# Patient Record
Sex: Female | Born: 1945 | Race: White | Hispanic: No | Smoking: Former smoker
Health system: Southern US, Community
[De-identification: ages and names within clinical notes are randomized; demographics above are authoritative.]

## PROBLEM LIST (undated history)

## (undated) VITALS — BP 156/92 | HR 80 | Temp 98.1°F | Resp 16 | Ht 64.0 in | Wt 157.0 lb

## (undated) DIAGNOSIS — I10 Essential (primary) hypertension: Secondary | ICD-10-CM

## (undated) DIAGNOSIS — F32A Depression, unspecified: Secondary | ICD-10-CM

## (undated) DIAGNOSIS — F329 Major depressive disorder, single episode, unspecified: Secondary | ICD-10-CM

## (undated) DIAGNOSIS — H409 Unspecified glaucoma: Secondary | ICD-10-CM

## (undated) DIAGNOSIS — M1990 Unspecified osteoarthritis, unspecified site: Secondary | ICD-10-CM

## (undated) DIAGNOSIS — E785 Hyperlipidemia, unspecified: Secondary | ICD-10-CM

## (undated) DIAGNOSIS — F419 Anxiety disorder, unspecified: Secondary | ICD-10-CM

## (undated) DIAGNOSIS — T7840XA Allergy, unspecified, initial encounter: Secondary | ICD-10-CM

## (undated) DIAGNOSIS — S42209A Unspecified fracture of upper end of unspecified humerus, initial encounter for closed fracture: Secondary | ICD-10-CM

## (undated) HISTORY — DX: Allergy, unspecified, initial encounter: T78.40XA

## (undated) HISTORY — DX: Essential (primary) hypertension: I10

## (undated) HISTORY — PX: ABDOMINAL HYSTERECTOMY: SHX81

## (undated) HISTORY — PX: HIP FRACTURE SURGERY: SHX118

---

## 2012-07-03 ENCOUNTER — Emergency Department (INDEPENDENT_AMBULATORY_CARE_PROVIDER_SITE_OTHER)
Admission: EM | Admit: 2012-07-03 | Discharge: 2012-07-03 | Disposition: A | Discharge status: Home / Self Care | Payer: Medicare Other | Source: Home / Self Care | Attending: Family Medicine | Admitting: Family Medicine

## 2012-07-03 DIAGNOSIS — I10 Essential (primary) hypertension: Secondary | ICD-10-CM

## 2012-07-03 DIAGNOSIS — R1013 Epigastric pain: Secondary | ICD-10-CM

## 2012-07-03 DIAGNOSIS — R109 Unspecified abdominal pain: Secondary | ICD-10-CM

## 2012-07-03 HISTORY — DX: Unspecified glaucoma: H40.9

## 2012-07-03 LAB — POCT URINALYSIS DIPSTICK
Protein, UA: 100
Spec Grav, UA: 1.015 (ref 1.005–1.03)
Urobilinogen, UA: 0.2 (ref 0–1)

## 2012-07-03 LAB — POCT CBC W AUTO DIFF (K'VILLE URGENT CARE)

## 2012-07-03 MED ORDER — METRONIDAZOLE 500 MG PO TABS: ORAL_TABLET | ORAL | Status: DC

## 2012-07-03 MED ORDER — HYDROCHLOROTHIAZIDE 12.5 MG PO TABS: 12.5000 mg | ORAL_TABLET | ORAL | Status: DC

## 2012-07-03 MED ORDER — SULFAMETHOXAZOLE-TRIMETHOPRIM 800-160 MG PO TABS: 1.0000 | ORAL_TABLET | Freq: Two times a day (BID) | ORAL | Status: DC

## 2012-07-03 NOTE — ED Notes (Signed)
Pt states she have abdominal pain that started on Friday after eating and continues. Denies SHOB, nausea, CP

## 2012-07-03 NOTE — ED Provider Notes (Signed)
History     CSN: [id number]  Arrival date & time 07/03/12  1629   First MD Initiated Contact with Patient 07/03/12 1653      Chief Complaint  Patient presents with  . Abdominal Pain      HPI Comments: Patient states that she developed vague lower abdominal pain about 4 days ago, non-radiating.  The pain has been generally constant for the past 48 hours, and awakened her last night.  She noted two brief episodes of reflux with mild nausea today, resolved.  No GU symptoms.  Her Bowel movements have been normal.  No fevers, chills, and sweats.  No vomiting.  She states that she has been eating lots of nuts about a month ago. She has a past surgical history of hysterectomy, but both ovaries remain.  Patient is a 66 y.o. female presenting with abdominal pain. The history is provided by the patient.  Abdominal Pain The primary symptoms of the illness include abdominal pain and nausea. The primary symptoms of the illness do not include fever, fatigue, shortness of breath, vomiting, diarrhea, hematemesis, hematochezia or dysuria. Episode onset: 4 days ago. The onset of the illness was gradual.  Risk factors for an acute abdominal problem include being elderly. Additional symptoms associated with the illness include heartburn. Symptoms associated with the illness do not include chills, anorexia, diaphoresis, constipation, urgency, hematuria, frequency or back pain.    Past Medical History  Diagnosis Date  . Glaucoma     Past Surgical History  Procedure Date  . Abdominal hysterectomy     Family History  Problem Relation Age of Onset  . Cancer Neg Hx   . Hypertension Neg Hx     History  Substance Use Topics  . Smoking status: Former Games developer  . Smokeless tobacco: Not on file  . Alcohol Use: No    OB History    Grav Para Term Preterm Abortions TAB SAB Ect Mult Living                  Review of Systems  Constitutional: Negative for fever, chills, diaphoresis and fatigue.    Respiratory: Negative for shortness of breath.   Gastrointestinal: Positive for heartburn, nausea and abdominal pain. Negative for vomiting, diarrhea, constipation, hematochezia, anorexia and hematemesis.  Genitourinary: Negative for dysuria, urgency, frequency and hematuria.  Musculoskeletal: Negative for back pain.  All other systems reviewed and are negative.    Allergies  Penicillins  Home Medications   Current Outpatient Rx  Name Route Sig Dispense Refill  . METRONIDAZOLE 500 MG PO TABS  Take one tab by mouth every 6 hours 28 tablet 0  . SULFAMETHOXAZOLE-TRIMETHOPRIM 800-160 MG PO TABS Oral Take 1 tablet by mouth 2 (two) times daily. 14 tablet 0  . TRAVOPROST (BAK FREE) 0.004 % OP SOLN  1 drop at bedtime.    Marland Kitchen LISINOPRIL-HYDROCHLOROTHIAZIDE 20-25 MG PO TABS Oral Take 1 tablet by mouth daily. 90 tablet 3    BP 224/119  Pulse 101  Temp 97.2 F (36.2 C)  Resp 24  Ht 5\' 4"  (1.626 m)  Wt 169 lb (76.658 kg)  BMI 29.01 kg/m2  SpO2 98%  Physical Exam Nursing notes and Vital Signs reviewed. Appearance:  Patient appears stated age, and in no acute distress.  She is alert and oriented  Eyes:  Pupils are equal, round, and reactive to light and accomodation.  Extraocular movement is intact.  Conjunctivae are not inflamed  Ears:  Canals normal.  Tympanic membranes normal.  Nose:  Mildly congested turbinates.  No sinus tenderness.   Pharynx:  Normal Neck:  Supple.  No adenopathy or thyromegaly.  Carotids normal upstroke Lungs:  Clear to auscultation.  Breath sounds are equal.  Heart:  Regular rate and rhythm without murmurs, rubs, or gallops.  Abdomen:   Tenderness in the right lower quadrant, and right peri-umbilical area.  No rebound tenderness.  No masses or hepatosplenomegaly.  Bowel sounds are present.  No CVA or flank tenderness.  Negative iliopsoas and obdurator tests. Extremities:  No edema.  No calf tenderness Skin:  No rash present.   ED Course  Procedures   none   Labs Reviewed  POCT CBC W AUTO DIFF ([facility])  WBC 8.6; LY 30.4; MO 2.3; GR 67.3; Hgb 14.9; Platelets 309   POCT URINALYSIS DIPSTICK:  Ket 15mg /dL; Blood small; Pro 100mg /dL; leuks trace   EKG:  No acute changes   1. Abdominal  pain, other specified site; suspect early diverticulitis.  Normal white blood count is reassuring.  2. Epigastric abdominal pain   3. Essential hypertension, benign; uncontrolled       MDM  Begin HCTZ 12.5mg  once daily. Begin Septra DS and Flagyl. Begin clear liquids for 18 to 24 hours.  Gradually advance to a BRAT diet for one to two days.  Resume regular diet as tolerated.  Check blood pressure daily at home and record. Followup with PCP in two days, earlier for worsening symptoms.        Lattie Haw, MD 07/05/12 [phone]

## 2012-07-05 ENCOUNTER — Encounter: Payer: Self-pay | Admitting: Sports Medicine

## 2012-07-05 ENCOUNTER — Ambulatory Visit (INDEPENDENT_AMBULATORY_CARE_PROVIDER_SITE_OTHER): Payer: Medicare Other | Admitting: Sports Medicine

## 2012-07-05 VITALS — BP 235/107 | HR 85 | Ht 64.0 in | Wt 169.0 lb

## 2012-07-05 DIAGNOSIS — L909 Atrophic disorder of skin, unspecified: Secondary | ICD-10-CM

## 2012-07-05 DIAGNOSIS — L919 Hypertrophic disorder of the skin, unspecified: Secondary | ICD-10-CM

## 2012-07-05 DIAGNOSIS — E785 Hyperlipidemia, unspecified: Secondary | ICD-10-CM

## 2012-07-05 DIAGNOSIS — I10 Essential (primary) hypertension: Secondary | ICD-10-CM | POA: Insufficient documentation

## 2012-07-05 DIAGNOSIS — L918 Other hypertrophic disorders of the skin: Secondary | ICD-10-CM | POA: Insufficient documentation

## 2012-07-05 LAB — COMPREHENSIVE METABOLIC PANEL
Alkaline Phosphatase: 94 U/L (ref 39–117)
BUN: 13 mg/dL (ref 6–23)
Creat: 0.79 mg/dL (ref 0.50–1.10)
Glucose, Bld: 120 mg/dL — ABNORMAL HIGH (ref 70–99)
Total Bilirubin: 0.6 mg/dL (ref 0.3–1.2)

## 2012-07-05 MED ORDER — LISINOPRIL-HYDROCHLOROTHIAZIDE 20-25 MG PO TABS: 1.0000 | ORAL_TABLET | Freq: Every day | ORAL | Status: DC

## 2012-07-05 NOTE — Assessment & Plan Note (Signed)
Extremely elevated today, but asymptomatic and without any signs of organ damage. We will go ahead and check a comprehensive metabolic panel, CBC, TSH, A1c and urinalysis. I'm going to change her medication to lisinopril/hydrochlorothiazide combo. I'm going to see her back in 2 weeks.

## 2012-07-05 NOTE — Progress Notes (Signed)
Subjective:    CC: Establish care.   HPI:  Hypertension: Very elevated, mild headache, but no visual changes, chest pain, shortness of breath, or lower extremity swelling. She was started on hydrochlorothiazide 12.5 mg at the urgent care, blood pressure still unchanged.  Abdominal pain: Diagnosed with diverticulitis at urgent care, overall greatly improved with Flagyl and Cipro. She also notes that she did have a high residue diet before the pain started.  Skin tag on face: Would like this removed.  Past medical history, Surgical history, Family history, Social history, Allergies, and medications have been entered into the medical record, reviewed, and no changes needed.   Review of Systems: No headache, visual changes, nausea, vomiting, diarrhea, constipation, dizziness, abdominal pain, skin rash, fevers, chills, night sweats, swollen lymph nodes, weight loss, chest pain, body aches, joint swelling, muscle aches, or shortness of breath.   Objective:    General: Well Developed, well nourished, and in no acute distress.  Neuro: Alert and oriented x3, extra-ocular muscles intact.  HEENT: Normocephalic, atraumatic, pupils equal round reactive to light, neck supple, no masses, no lymphadenopathy, thyroid nonpalpable.  Skin: Warm and dry, no rashes noted. There is a lesion on her right cheek that appears to be a skin tag. Cardiac: Regular rate and rhythm, no murmurs rubs or gallops. No carotid bruit, no renal bruit, no palpable abdominal pulsatile mass. Respiratory: Clear to auscultation bilaterally. Not using accessory muscles, speaking in full sentences.  Abdominal: Soft, nontender, nondistended, positive bowel sounds, no masses, no organomegaly.  Musculoskeletal: Shoulder, elbow, wrist, hip, knee, ankle stable, and with full range of motion.  Impression and Recommendations:

## 2012-07-05 NOTE — Assessment & Plan Note (Signed)
I am happy to remove this in the office, however we agreed that she would control her blood pressure before removing the skin tag.

## 2012-07-06 ENCOUNTER — Encounter: Payer: Self-pay | Admitting: Sports Medicine

## 2012-07-06 ENCOUNTER — Telehealth: Payer: Self-pay | Admitting: *Deleted

## 2012-07-06 ENCOUNTER — Ambulatory Visit (INDEPENDENT_AMBULATORY_CARE_PROVIDER_SITE_OTHER): Payer: Medicare Other | Admitting: Sports Medicine

## 2012-07-06 VITALS — BP 182/104 | HR 75

## 2012-07-06 DIAGNOSIS — R21 Rash and other nonspecific skin eruption: Secondary | ICD-10-CM

## 2012-07-06 LAB — CBC
HCT: 40.5 % (ref 36.0–46.0)
Hemoglobin: 14.4 g/dL (ref 12.0–15.0)
MCH: 30.6 pg (ref 26.0–34.0)
MCHC: 35.6 g/dL (ref 30.0–36.0)
MCV: 86.2 fL (ref 78.0–100.0)
Platelets: 351 K/uL (ref 150–400)
RBC: 4.7 MIL/uL (ref 3.87–5.11)
RDW: 12.2 % (ref 11.5–15.5)
WBC: 5.5 K/uL (ref 4.0–10.5)

## 2012-07-06 LAB — LIPID PANEL
Cholesterol: 239 mg/dL — ABNORMAL HIGH (ref 0–200)
HDL: 64 mg/dL (ref >39)
LDL Cholesterol: 163 mg/dL — ABNORMAL HIGH (ref 0–99)
Total CHOL/HDL Ratio: 3.7 Ratio
Triglycerides: 59 mg/dL (ref <150)
VLDL: 12 mg/dL (ref 0–40)

## 2012-07-06 LAB — COMPREHENSIVE METABOLIC PANEL WITH GFR
ALT: 21 U/L (ref 0–35)
AST: 24 U/L (ref 0–37)
Albumin: 4.7 g/dL (ref 3.5–5.2)
Alkaline Phosphatase: 79 U/L (ref 39–117)
BUN: 11 mg/dL (ref 6–23)
Chloride: 97 meq/L (ref 96–112)
Potassium: 4.1 meq/L (ref 3.5–5.3)
Sodium: 134 meq/L — ABNORMAL LOW (ref 135–145)

## 2012-07-06 LAB — COMPREHENSIVE METABOLIC PANEL
CO2: 26 mEq/L (ref 19–32)
Calcium: 9.6 mg/dL (ref 8.4–10.5)
Creat: 1.09 mg/dL (ref 0.50–1.10)
Glucose, Bld: 106 mg/dL — ABNORMAL HIGH (ref 70–99)
Total Bilirubin: 0.7 mg/dL (ref 0.3–1.2)
Total Protein: 6.9 g/dL (ref 6.0–8.3)

## 2012-07-06 LAB — HEMOGLOBIN A1C
Hgb A1c MFr Bld: 6.1 % — ABNORMAL HIGH (ref <5.7)
Mean Plasma Glucose: 128 mg/dL — ABNORMAL HIGH (ref <117)

## 2012-07-06 LAB — TSH: TSH: 2.111 u[IU]/mL (ref 0.350–4.500)

## 2012-07-06 MED ORDER — METHYLPREDNISOLONE SODIUM SUCC 125 MG IJ SOLR
125.0000 mg | Freq: Once | INTRAMUSCULAR | Status: AC
  Administered 2012-07-06: 125 mg via INTRAMUSCULAR

## 2012-07-06 MED ORDER — SODIUM CHLORIDE 0.9 % IV SOLN: 125.0000 mg | Freq: Once | INTRAVENOUS | Status: DC

## 2012-07-06 NOTE — Assessment & Plan Note (Addendum)
I doubt that this is related to lisinopril/hydrochlorothiazide combination pill. Is not pruritic. She does feel a little bit flushed. This does not represent angioedema. We will give her a shot of Solu-Medrol 125 mg, and she will use Benadryl 4 times a day. She will continue her lisinopril and hydrochlorothiazide, and come back to see me at her regularly scheduled blood pressure check.

## 2012-07-06 NOTE — Telephone Encounter (Signed)
Pt calls office back and states she seen you this morning and you were gonna call in an antihistamine. She checked with [facility] in [place] and they do not have anything

## 2012-07-06 NOTE — Telephone Encounter (Signed)
Kristy Hays,   On this pt I gave solu-medrol 125 IM. The order in the computer that is for solumedrol 130 IV is not correct. It wont let me delete the order. Is there any way you can delete this so that she doesn't get charged twice and it doesn't look like we gave this to her?

## 2012-07-06 NOTE — Progress Notes (Signed)
Subjective:    CC: Rash  HPI: Kristy Hays saw me yesterday for elevated blood pressure, she was already taking hydrochlorothiazide, as well as a few days of Cipro and Flagyl for diverticulitis. I should change her medicines to lisinopril hydrochlorothiazide combination pill. She took that yesterday, this morning she noted a slightly erythematous rash on her chest. It was not pruritic, she denies any numbness or tingling in the face, swelling of the lips or tongue, or symptoms anywhere else on her body. She denies using any other medicines, drinking alcohol, insect bites, new detergents, or new lotions, or perfumes. She denies any shortness of breath.  Past medical history, Surgical history, Family history, Social history, Allergies, and medications have been entered into the medical record, reviewed, and no changes needed.   Review of Systems: No fevers, chills, night sweats, weight loss, chest pain, or shortness of breath.   Objective:    General: Well Developed, well nourished, and in no acute distress.  Neuro: Alert and oriented x3, extra-ocular muscles intact.  HEENT: Normocephalic, atraumatic, pupils equal round reactive to light, neck supple, no masses, no lymphadenopathy, thyroid nonpalpable.  Skin: Warm and dry, there is a very minimal amount of erythema is blanching, and not excoriated on her upper chest. There's no lip, tongue swelling, or mucocutaneous involvement. Cardiac: Regular rate and rhythm, no murmurs rubs or gallops.  Respiratory: Clear to auscultation bilaterally. Not using accessory muscles, speaking in full sentences.  125 mg of intramuscular Solu-Medrol given in the office.   Impression and Recommendations:

## 2012-07-06 NOTE — Telephone Encounter (Signed)
Yeah I wanted her to take benadryl 25mg  q6h.  Its OTC but I can call it in if she wants?

## 2012-07-06 NOTE — Telephone Encounter (Signed)
Pt notified and she purchase this OTC

## 2012-07-07 ENCOUNTER — Telehealth: Payer: Self-pay | Admitting: *Deleted

## 2012-07-07 ENCOUNTER — Ambulatory Visit (INDEPENDENT_AMBULATORY_CARE_PROVIDER_SITE_OTHER): Payer: Medicare Other | Admitting: Sports Medicine

## 2012-07-07 ENCOUNTER — Encounter: Payer: Self-pay | Admitting: Sports Medicine

## 2012-07-07 VITALS — BP 173/96 | HR 91 | Wt 167.0 lb

## 2012-07-07 DIAGNOSIS — R21 Rash and other nonspecific skin eruption: Secondary | ICD-10-CM

## 2012-07-07 DIAGNOSIS — E785 Hyperlipidemia, unspecified: Secondary | ICD-10-CM | POA: Insufficient documentation

## 2012-07-07 DIAGNOSIS — F4323 Adjustment disorder with mixed anxiety and depressed mood: Secondary | ICD-10-CM | POA: Insufficient documentation

## 2012-07-07 DIAGNOSIS — I10 Essential (primary) hypertension: Secondary | ICD-10-CM

## 2012-07-07 DIAGNOSIS — Z711 Person with feared health complaint in whom no diagnosis is made: Secondary | ICD-10-CM

## 2012-07-07 LAB — URINALYSIS, ROUTINE W REFLEX MICROSCOPIC
Glucose, UA: NEGATIVE mg/dL
Hgb urine dipstick: NEGATIVE
Nitrite: POSITIVE — AB
Protein, ur: NEGATIVE mg/dL
Specific Gravity, Urine: 1.028 (ref 1.005–1.030)
Urobilinogen, UA: 1 mg/dL (ref 0.0–1.0)
pH: 5.5 (ref 5.0–8.0)

## 2012-07-07 LAB — URINALYSIS, MICROSCOPIC ONLY
Bacteria, UA: NONE SEEN
Casts: NONE SEEN

## 2012-07-07 MED ORDER — DIAZEPAM 5 MG PO TABS: 5.0000 mg | ORAL_TABLET | Freq: Two times a day (BID) | ORAL | Status: DC | PRN

## 2012-07-07 MED ORDER — ATORVASTATIN CALCIUM 40 MG PO TABS: 40.0000 mg | ORAL_TABLET | Freq: Every day | ORAL | Status: DC

## 2012-07-07 MED ORDER — ONDANSETRON 8 MG PO TBDP: 8.0000 mg | ORAL_TABLET | Freq: Three times a day (TID) | ORAL | Status: DC | PRN

## 2012-07-07 NOTE — Assessment & Plan Note (Signed)
She is now jittery and a little bit nauseated, I do suspect from the steroid given yesterday. I would like her to stop the metronidazole as this can cause nausea, I will also give her a little bit of Valium, and Zofran. She will come back to see me at her next regularly scheduled visit.

## 2012-07-07 NOTE — Telephone Encounter (Signed)
Pt notified and sent to schedule appt 

## 2012-07-07 NOTE — Assessment & Plan Note (Signed)
Improving. Continue medications. We'll go ahead and check a CMET with her current nausea.

## 2012-07-07 NOTE — Addendum Note (Signed)
Addended by: Monica Becton on: 07/07/2012 10:43 AM   Modules accepted: Orders

## 2012-07-07 NOTE — Assessment & Plan Note (Signed)
Improving status post Solu-Medrol yesterday.

## 2012-07-07 NOTE — Progress Notes (Signed)
Subjective:    CC: Followup  HPI: This is a very pleasant 66 year old female twice yesterday for a rash. To recap, she was diagnosed in urgent care with diverticulitis, and started on Flagyl and Cipro. She saw me the next day, and they started her on lisinopril/hydrochlorothiazide combination medicine for hypertension. Her blood pressure had been improving, however she developed a rash on her chest that was not pruritic. She had no shortness of breath, and no lip or tongue swelling. I gave her a shot of Solu-Medrol, but did not change her medications. Overall the rash is improving, but today she returns feeling somewhat jittery. She also has a small amount of loose stools. She also has a little bit of nausea.  She is otherwise asymptomatic.  She thinks she is just anxious about her treatments and diagnoses thus far.  Past medical history, Surgical history, Family history, Social history, Allergies, and medications have been entered into the medical record, reviewed, and no changes needed.   Review of Systems: No fevers, chills, night sweats, weight loss, chest pain, or shortness of breath.   Objective:    General: Well Developed, well nourished, and in no acute distress.  Neuro: Alert and oriented x3, extra-ocular muscles intact.  HEENT: Normocephalic, atraumatic, pupils equal round reactive to light, neck supple, no masses, no lymphadenopathy, thyroid nonpalpable.  Skin: Warm and dry, the mild erythema that was present on her chest before has improved. Cardiac: Regular rate and rhythm, no murmurs rubs or gallops.  Respiratory: Clear to auscultation bilaterally. Not using accessory muscles, speaking in full sentences. Abdomen: Soft, nontender, nondistended, normal bowel sounds.  Impression and Recommendations:

## 2012-07-07 NOTE — Telephone Encounter (Signed)
I never rx'ed any antibiotics.  I think urgent care did. Have her stop the metronidazole for now. I would also like her to drop by to see me sometime today to see whats going on. Thanks!

## 2012-07-07 NOTE — Telephone Encounter (Signed)
Pt calls and states you put her on BP med and 2 antibiotics and she is very nauseated and has diarrhea. Please advise

## 2012-07-08 ENCOUNTER — Telehealth: Payer: Self-pay | Admitting: Sports Medicine

## 2012-07-08 LAB — COMPREHENSIVE METABOLIC PANEL
ALT: 24 U/L (ref 0–35)
BUN: 16 mg/dL (ref 6–23)
CO2: 24 mEq/L (ref 19–32)
Calcium: 10 mg/dL (ref 8.4–10.5)
Creat: 1.26 mg/dL — ABNORMAL HIGH (ref 0.50–1.10)
Glucose, Bld: 106 mg/dL — ABNORMAL HIGH (ref 70–99)
Total Bilirubin: 0.6 mg/dL (ref 0.3–1.2)

## 2012-07-08 LAB — COMPREHENSIVE METABOLIC PANEL WITH GFR
AST: 28 U/L (ref 0–37)
Albumin: 4.8 g/dL (ref 3.5–5.2)
Alkaline Phosphatase: 80 U/L (ref 39–117)
Chloride: 93 meq/L — ABNORMAL LOW (ref 96–112)
Potassium: 3.8 meq/L (ref 3.5–5.3)
Sodium: 130 meq/L — ABNORMAL LOW (ref 135–145)
Total Protein: 7.5 g/dL (ref 6.0–8.3)

## 2012-07-08 MED ORDER — AMLODIPINE BESYLATE 5 MG PO TABS: 5.0000 mg | ORAL_TABLET | Freq: Every day | ORAL | Status: DC

## 2012-07-08 NOTE — Addendum Note (Signed)
Addended by: Monica Becton on: 07/08/2012 11:05 AM   Modules accepted: Orders

## 2012-07-08 NOTE — Telephone Encounter (Signed)
Called patient, advised of lab results. She will hold BP meds for a week and liberalize salt in diet during that time. Will restart at 1/2 tab, and then come back to see me at regularly scheduled visit. 1 whole lis/hctz tab was likely too much, will gradually up-taper.

## 2012-07-11 ENCOUNTER — Encounter: Payer: Self-pay | Admitting: Sports Medicine

## 2012-07-11 ENCOUNTER — Ambulatory Visit (INDEPENDENT_AMBULATORY_CARE_PROVIDER_SITE_OTHER): Payer: Medicare Other | Admitting: Sports Medicine

## 2012-07-11 VITALS — BP 187/103 | HR 88 | Wt 163.0 lb

## 2012-07-11 DIAGNOSIS — L918 Other hypertrophic disorders of the skin: Secondary | ICD-10-CM

## 2012-07-11 DIAGNOSIS — I10 Essential (primary) hypertension: Secondary | ICD-10-CM

## 2012-07-11 DIAGNOSIS — L909 Atrophic disorder of skin, unspecified: Secondary | ICD-10-CM

## 2012-07-11 DIAGNOSIS — E785 Hyperlipidemia, unspecified: Secondary | ICD-10-CM

## 2012-07-11 DIAGNOSIS — Z711 Person with feared health complaint in whom no diagnosis is made: Secondary | ICD-10-CM

## 2012-07-11 MED ORDER — CITALOPRAM HYDROBROMIDE 10 MG PO TABS: 10.0000 mg | ORAL_TABLET | Freq: Every day | ORAL | Status: DC

## 2012-07-11 MED ORDER — DIAZEPAM 5 MG PO TABS: 5.0000 mg | ORAL_TABLET | Freq: Three times a day (TID) | ORAL | Status: DC | PRN

## 2012-07-11 MED ORDER — AMLODIPINE BESYLATE 10 MG PO TABS: 10.0000 mg | ORAL_TABLET | Freq: Every day | ORAL | Status: DC

## 2012-07-11 NOTE — Progress Notes (Signed)
Subjective:    CC: Followup  HPI: Anxiety: Jesika called me this weekend, she was doing better with the occasional Valium, still had significant anxiety, and worry regarding her blood pressure. I asked her to come to the office for further evaluation.  Hypertension: She has started her amlodipine, and remains off of the lisinopril/hydrochlorothiazide combination.  Past medical history, Surgical history, Family history, Social history, Allergies, and medications have been entered into the medical record, reviewed, and no changes needed.   Review of Systems: No fevers, chills, night sweats, weight loss, chest pain, or shortness of breath.   Objective:    General: Well Developed, well nourished, and in no acute distress.  Neuro: Alert and oriented x3, extra-ocular muscles intact.  HEENT: Normocephalic, atraumatic, pupils equal round reactive to light, neck supple, no masses, no lymphadenopathy, thyroid nonpalpable.  Skin: Warm and dry, no rashes. Cardiac: Regular rate and rhythm, no murmurs rubs or gallops.  Respiratory: Clear to auscultation bilaterally. Not using accessory muscles, speaking in full sentences.   Impression and Recommendations:

## 2012-07-11 NOTE — Assessment & Plan Note (Signed)
We'll refill her Valium. I would also like to start her on citalopram, 10 mg daily. We do need to keep aware of possible hyponatremia that can be associated with citalopram. I will likely check her sodium level again in a few weeks.

## 2012-07-11 NOTE — Assessment & Plan Note (Signed)
Continue Lipitor. Recheck in 3 months.

## 2012-07-11 NOTE — Assessment & Plan Note (Signed)
She did have some mild hyponatremia, and increased creatinine on the lisinopril hydrochlorothiazide full tab. She has been on Norvasc for several days now. Restarting lisinopril/hydrochlorothiazide one half tab daily. Increasing Norvasc to 10 mg daily. He will come back in 2 weeks for a blood pressure check.

## 2012-07-19 ENCOUNTER — Ambulatory Visit: Payer: Medicare Other | Admitting: Sports Medicine

## 2012-07-24 ENCOUNTER — Telehealth: Payer: Self-pay | Admitting: Sports Medicine

## 2012-07-24 DIAGNOSIS — E871 Hypo-osmolality and hyponatremia: Secondary | ICD-10-CM

## 2012-07-24 NOTE — Telephone Encounter (Signed)
Kristy Hays called with increasing adverse effects and anxiety that she attributes to her BP medication. Please have her come and get a BMET checked Monday and then come see me Tuesday to discuss changes. Continue all medications in the meantime including the 2 BP meds and celexa.

## 2012-07-25 ENCOUNTER — Telehealth: Payer: Self-pay

## 2012-07-25 ENCOUNTER — Telehealth: Payer: Self-pay | Admitting: *Deleted

## 2012-07-25 DIAGNOSIS — E871 Hypo-osmolality and hyponatremia: Secondary | ICD-10-CM

## 2012-07-25 NOTE — Telephone Encounter (Signed)
Lab released.

## 2012-07-25 NOTE — Telephone Encounter (Signed)
Patient stats the citalopram makes her feel more anxious.

## 2012-07-25 NOTE — Telephone Encounter (Signed)
Pt notified and pt will reschedule her 9 am appt tomorrow for 1130 tomorrow so her daughter can take her

## 2012-07-26 ENCOUNTER — Encounter: Payer: Self-pay | Admitting: Sports Medicine

## 2012-07-26 ENCOUNTER — Ambulatory Visit (INDEPENDENT_AMBULATORY_CARE_PROVIDER_SITE_OTHER): Payer: Medicare Other | Admitting: Sports Medicine

## 2012-07-26 VITALS — BP 195/99 | HR 94 | Wt 160.0 lb

## 2012-07-26 DIAGNOSIS — I10 Essential (primary) hypertension: Secondary | ICD-10-CM

## 2012-07-26 DIAGNOSIS — F411 Generalized anxiety disorder: Secondary | ICD-10-CM

## 2012-07-26 DIAGNOSIS — F419 Anxiety disorder, unspecified: Secondary | ICD-10-CM

## 2012-07-26 LAB — BASIC METABOLIC PANEL WITH GFR
BUN: 13 mg/dL (ref 6–23)
Calcium: 9.5 mg/dL (ref 8.4–10.5)
Creat: 0.78 mg/dL (ref 0.50–1.10)

## 2012-07-26 LAB — BASIC METABOLIC PANEL
CO2: 29 mEq/L (ref 19–32)
Chloride: 106 mEq/L (ref 96–112)
Glucose, Bld: 79 mg/dL (ref 70–99)
Potassium: 3.9 mEq/L (ref 3.5–5.3)
Sodium: 144 mEq/L (ref 135–145)

## 2012-07-26 MED ORDER — BUSPIRONE HCL 5 MG PO TABS: 5.0000 mg | ORAL_TABLET | Freq: Three times a day (TID) | ORAL | Status: DC

## 2012-07-26 MED ORDER — ATENOLOL 25 MG PO TABS: 25.0000 mg | ORAL_TABLET | Freq: Every day | ORAL | Status: DC

## 2012-07-26 NOTE — Assessment & Plan Note (Signed)
Discontinue citalopram. We'll add buspirone.

## 2012-07-26 NOTE — Progress Notes (Signed)
Subjective:    CC: Followup  HPI: This patient comes back for followup of her hypertension. Hypertension: She has been lisinopril/hydrochlorothiazide, and amlodipine. Unfortunately she did have some renal insufficiency initially, we decreased the dose, liberalized her salt, and this has since resolved.  Anxiety: Worsening, and uncontrolled with citalopram 20 mg daily for 2 weeks. She understands the leprosy we need to give this medicine for 6 weeks, but still desires to switch to a different one.  Past medical history, Surgical history, Family history, Social history, Allergies, and medications have been entered into the medical record, reviewed, and no changes needed.   Review of Systems: No fevers, chills, night sweats, weight loss, chest pain, or shortness of breath. No headaches, visual changes, numbness, tingling, weakness.  Objective:    General: Well Developed, well nourished, and in no acute distress.  Neuro: Alert and oriented x3, extra-ocular muscles intact.  HEENT: Normocephalic, atraumatic, pupils equal round reactive to light, neck supple, no masses, no lymphadenopathy, thyroid nonpalpable.  Skin: Warm and dry, no rashes. Cardiac: Regular rate and rhythm, no murmurs rubs or gallops.  Respiratory: Clear to auscultation bilaterally. Not using accessory muscles, speaking in full sentences.  Impression and Recommendations:

## 2012-07-26 NOTE — Patient Instructions (Addendum)
Added to medicines, Antenolol for blood pressure and anxiety. Also added BuSpar for anxiety. Stop the citalopram, continue all other medications.

## 2012-07-26 NOTE — Assessment & Plan Note (Addendum)
Basic metabolic panel has normalized. Continue all current medications, I will add atenolol for both its anti-hypertensive action as well as anti-anxiety. Followup in 2-3 weeks to recheck

## 2012-07-29 ENCOUNTER — Telehealth: Payer: Self-pay | Admitting: *Deleted

## 2012-07-29 ENCOUNTER — Other Ambulatory Visit: Payer: Self-pay | Admitting: Sports Medicine

## 2012-07-29 MED ORDER — HYDROXYZINE HCL 25 MG PO TABS: 25.0000 mg | ORAL_TABLET | Freq: Every evening | ORAL | Status: DC | PRN

## 2012-07-29 NOTE — Telephone Encounter (Signed)
We will try something over the counter, she can try hydroxyzine 25 mg at bedtime. This comes under the name Vistaril and Atarax.

## 2012-07-29 NOTE — Telephone Encounter (Signed)
Please continue BuSpar, adverse effects Will resolve with time.

## 2012-07-29 NOTE — Telephone Encounter (Signed)
Pt informed

## 2012-07-29 NOTE — Telephone Encounter (Signed)
Pt has called stating that she is having SE from the Buspar. States she feels depressed,not sleeping and hands are shaky. She feels it is coming from Buspar and wants to know if she can try something different or if you need to send her to a specialist. Please advise.

## 2012-07-29 NOTE — Telephone Encounter (Signed)
Pt states what can she take to help her sleep?

## 2012-07-29 NOTE — Telephone Encounter (Signed)
Medication sent per Dr. Benjamin Stain.

## 2012-08-01 ENCOUNTER — Telehealth: Payer: Self-pay | Admitting: *Deleted

## 2012-08-01 DIAGNOSIS — F419 Anxiety disorder, unspecified: Secondary | ICD-10-CM

## 2012-08-01 NOTE — Telephone Encounter (Signed)
Pt has called and states she was speaking with her children over the weekend and states they all think she needs to be referred to [facility] for her anxiety. Apparently she has called downstairs and they told her she needs a referral. Please advise.

## 2012-08-01 NOTE — Telephone Encounter (Signed)
I think that this is an excellent idea. I will put in a referral.

## 2012-08-05 ENCOUNTER — Encounter (HOSPITAL_COMMUNITY): Payer: Self-pay | Admitting: Psychiatry

## 2012-08-05 ENCOUNTER — Ambulatory Visit (INDEPENDENT_AMBULATORY_CARE_PROVIDER_SITE_OTHER): Payer: No Typology Code available for payment source | Admitting: Psychiatry

## 2012-08-05 VITALS — BP 174/102 | HR 69 | Ht 64.0 in | Wt 160.0 lb

## 2012-08-05 DIAGNOSIS — F419 Anxiety disorder, unspecified: Secondary | ICD-10-CM

## 2012-08-05 DIAGNOSIS — F4322 Adjustment disorder with anxiety: Secondary | ICD-10-CM

## 2012-08-05 MED ORDER — BUSPIRONE HCL 5 MG PO TABS: 5.0000 mg | ORAL_TABLET | Freq: Three times a day (TID) | ORAL | Status: DC
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
## 2012-08-09 ENCOUNTER — Ambulatory Visit (INDEPENDENT_AMBULATORY_CARE_PROVIDER_SITE_OTHER): Payer: Medicare Other | Admitting: Sports Medicine

## 2012-08-09 ENCOUNTER — Encounter: Payer: Self-pay | Admitting: Sports Medicine

## 2012-08-09 VITALS — BP 176/83 | HR 69 | Resp 18 | Wt 156.0 lb

## 2012-08-09 DIAGNOSIS — Z23 Encounter for immunization: Secondary | ICD-10-CM

## 2012-08-09 DIAGNOSIS — F419 Anxiety disorder, unspecified: Secondary | ICD-10-CM

## 2012-08-09 DIAGNOSIS — Z299 Encounter for prophylactic measures, unspecified: Secondary | ICD-10-CM | POA: Insufficient documentation

## 2012-08-09 DIAGNOSIS — F411 Generalized anxiety disorder: Secondary | ICD-10-CM

## 2012-08-09 DIAGNOSIS — Z298 Encounter for other specified prophylactic measures: Secondary | ICD-10-CM

## 2012-08-09 DIAGNOSIS — Z2911 Encounter for prophylactic immunotherapy for respiratory syncytial virus (RSV): Secondary | ICD-10-CM

## 2012-08-09 DIAGNOSIS — I10 Essential (primary) hypertension: Secondary | ICD-10-CM

## 2012-08-09 MED ORDER — CARVEDILOL 12.5 MG PO TABS: 12.5000 mg | ORAL_TABLET | Freq: Two times a day (BID) | ORAL | Status: DC

## 2012-08-09 NOTE — Assessment & Plan Note (Signed)
Zostavax, Pneumovax, Tdap today. We will be visit colonoscopy and mammogram at the start of next year per patient request.

## 2012-08-09 NOTE — Assessment & Plan Note (Signed)
Continue BuSpar 3 times a day. Symptoms have improved.

## 2012-08-09 NOTE — Progress Notes (Signed)
Subjective:    CC: Followup  HPI: Hypertension: Improved.  Anxiety: Went to see psychiatry, is doing well on BuSpar 3 times a day.  Preventative measures: Due for multiple immunizations, colonoscopy, mammogram. She declines colonoscopy and mammogram, would like to postpone this to the new year.  Past medical history, Surgical history, Family history, Social history, Allergies, and medications have been entered into the medical record, reviewed, and no changes needed.   Review of Systems: No fevers, chills, night sweats, weight loss, chest pain, or shortness of breath.   Objective:    General: Well Developed, well nourished, and in no acute distress.  Neuro: Alert and oriented x3, extra-ocular muscles intact.  HEENT: Normocephalic, atraumatic, pupils equal round reactive to light, neck supple, no masses, no lymphadenopathy, thyroid nonpalpable.  Skin: Warm and dry, no rashes. Cardiac: Regular rate and rhythm, no murmurs rubs or gallops.  Respiratory: Clear to auscultation bilaterally. Not using accessory muscles, speaking in full sentences. No lower extremity edema.  Impression and Recommendations:

## 2012-08-09 NOTE — Assessment & Plan Note (Signed)
Blood pressures improved but not ideal. Discontinue atenolol, and replace with carvedilol 12.5 mg twice a day. Continue lisinopril/hydrochlorothiazide, and amlodipine.

## 2012-08-19 ENCOUNTER — Telehealth (HOSPITAL_COMMUNITY): Payer: Self-pay

## 2012-08-19 NOTE — Telephone Encounter (Signed)
Acknowleged

## 2012-08-23 ENCOUNTER — Ambulatory Visit (INDEPENDENT_AMBULATORY_CARE_PROVIDER_SITE_OTHER): Payer: BLUE CROSS/BLUE SHIELD | Admitting: Sports Medicine

## 2012-08-23 ENCOUNTER — Encounter: Payer: Self-pay | Admitting: Sports Medicine

## 2012-08-23 VITALS — BP 189/97 | HR 69 | Wt 161.0 lb

## 2012-08-23 DIAGNOSIS — L918 Other hypertrophic disorders of the skin: Secondary | ICD-10-CM

## 2012-08-23 DIAGNOSIS — E785 Hyperlipidemia, unspecified: Secondary | ICD-10-CM

## 2012-08-23 DIAGNOSIS — I10 Essential (primary) hypertension: Secondary | ICD-10-CM

## 2012-08-23 DIAGNOSIS — F419 Anxiety disorder, unspecified: Secondary | ICD-10-CM

## 2012-08-23 DIAGNOSIS — L909 Atrophic disorder of skin, unspecified: Secondary | ICD-10-CM

## 2012-08-23 DIAGNOSIS — F411 Generalized anxiety disorder: Secondary | ICD-10-CM

## 2012-08-23 DIAGNOSIS — L919 Hypertrophic disorder of the skin, unspecified: Secondary | ICD-10-CM

## 2012-08-23 MED ORDER — CARVEDILOL 25 MG PO TABS: 25.0000 mg | ORAL_TABLET | Freq: Two times a day (BID) | ORAL | Status: DC

## 2012-08-23 MED ORDER — BUSPIRONE HCL 5 MG PO TABS: 5.0000 mg | ORAL_TABLET | Freq: Three times a day (TID) | ORAL | Status: DC

## 2012-08-23 MED ORDER — ASPIRIN EC 81 MG PO TBEC: 81.0000 mg | DELAYED_RELEASE_TABLET | Freq: Every day | ORAL | Status: DC

## 2012-08-23 NOTE — Assessment & Plan Note (Addendum)
Blood pressure is still relatively uncontrolled. It is improved from before. Increase lisinopril/hydrochlorothiazide to one whole tab daily. Increase carvedilol to 25 mg twice a day. Continue amlodipine 10 mg daily. Come back to see me in 2-3 weeks.

## 2012-08-23 NOTE — Patient Instructions (Addendum)
Increase lisinopril/hydrochlorothiazide to one entire tablet daily. Double up the carvedilol to two 12-1/2 mg tablets twice a day until you ran out, then refill with the new 25 mg tablets and take one tablet twice a day.

## 2012-08-23 NOTE — Progress Notes (Signed)
Subjective:    CC: Followup  HPI: Hypertension: Denies headaches, chest pain, swelling, visual changes. Blood pressure is still elevated, she is currently using one half of a lisinopril/hydrochlorothiazide tablet, amlodipine 10 mg, and carvedilol 12.5 mg twice a day.  Anxiety: Significantly improved, and very well controlled with BuSpar. She is desiring a refill.  Past medical history, Surgical history, Family history, Social history, Allergies, and medications have been entered into the medical record, reviewed, and no changes needed.   Review of Systems: No fevers, chills, night sweats, weight loss, chest pain, or shortness of breath.   Objective:    General: Well Developed, well nourished, and in no acute distress.  Neuro: Alert and oriented x3, extra-ocular muscles intact.  HEENT: Normocephalic, atraumatic, pupils equal round reactive to light, neck supple, no masses, no lymphadenopathy, thyroid nonpalpable.  Skin: Warm and dry, no rashes. Cardiac: Regular rate and rhythm, no murmurs rubs or gallops.  Respiratory: Clear to auscultation bilaterally. Not using accessory muscles, speaking in full sentences.  Impression and Recommendations:

## 2012-08-23 NOTE — Assessment & Plan Note (Signed)
Medicines working extremely well, we will refill this right now.

## 2012-09-01 ENCOUNTER — Ambulatory Visit (INDEPENDENT_AMBULATORY_CARE_PROVIDER_SITE_OTHER): Payer: Medicare Other | Admitting: Psychiatry

## 2012-09-01 ENCOUNTER — Encounter (HOSPITAL_COMMUNITY): Payer: Self-pay | Admitting: Psychiatry

## 2012-09-01 VITALS — BP 158/83 | HR 64 | Ht 64.0 in | Wt 164.0 lb

## 2012-09-01 DIAGNOSIS — F419 Anxiety disorder, unspecified: Secondary | ICD-10-CM

## 2012-09-01 DIAGNOSIS — F4322 Adjustment disorder with anxiety: Secondary | ICD-10-CM
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
## 2012-09-15 ENCOUNTER — Encounter: Payer: Self-pay | Admitting: Sports Medicine

## 2012-09-15 ENCOUNTER — Ambulatory Visit (INDEPENDENT_AMBULATORY_CARE_PROVIDER_SITE_OTHER): Payer: Medicare Other | Admitting: Sports Medicine

## 2012-09-15 VITALS — BP 159/77 | HR 60 | Wt 165.0 lb

## 2012-09-15 DIAGNOSIS — I10 Essential (primary) hypertension: Secondary | ICD-10-CM

## 2012-09-15 MED ORDER — CLONIDINE HCL 0.2 MG PO TABS: 0.2000 mg | ORAL_TABLET | Freq: Two times a day (BID) | ORAL | Status: DC

## 2012-09-15 NOTE — Progress Notes (Signed)
Subjective:    CC: Hypertension followup.  HPI: Hypertension: Greatly improved today. Doing very well with her current medications which include lisinopril/hydrochlorothiazide 20/25 mg, amlodipine at 10 mg, carvedilol 25 mg twice a day. Overall feels much better. Does have a small amount of lower extremity swelling likely from the amlodipine.  Past medical history, Surgical history, Family history, Social history, Allergies, and medications have been entered into the medical record, reviewed, and no changes needed.   Review of Systems: No fevers, chills, night sweats, weight loss, chest pain, or shortness of breath.   Objective:    General: Well Developed, well nourished, and in no acute distress.  Neuro: Alert and oriented x3, extra-ocular muscles intact.  HEENT: Normocephalic, atraumatic, pupils equal round reactive to light, neck supple, no masses, no lymphadenopathy, thyroid nonpalpable.  Skin: Warm and dry, no rashes. Cardiac: Regular rate and rhythm, no murmurs rubs or gallops.  Respiratory: Clear to auscultation bilaterally. Not using accessory muscles, speaking in full sentences.  Impression and Recommendations:

## 2012-09-15 NOTE — Assessment & Plan Note (Signed)
Much improved but not where we need it to be. Adding clonidine 0.2 mg BID. RTC 3 weeks to recheck.

## 2012-10-06 ENCOUNTER — Ambulatory Visit (INDEPENDENT_AMBULATORY_CARE_PROVIDER_SITE_OTHER): Payer: Medicare Other | Admitting: Sports Medicine

## 2012-10-06 ENCOUNTER — Encounter: Payer: Self-pay | Admitting: Sports Medicine

## 2012-10-06 VITALS — BP 155/87 | HR 59 | Wt 169.0 lb

## 2012-10-06 DIAGNOSIS — I10 Essential (primary) hypertension: Secondary | ICD-10-CM

## 2012-10-06 MED ORDER — HYDRALAZINE HCL 25 MG PO TABS: 25.0000 mg | ORAL_TABLET | Freq: Three times a day (TID) | ORAL | Status: DC

## 2012-10-06 NOTE — Progress Notes (Signed)
Subjective:    CC: Followup  HPI: Hypertension: Is significantly improved on carvedilol, lisinopril/hydrochlorothiazide, amlodipine, I recently started clonidine which makes her too groggy however she does continue to take it.  Past medical history, Surgical history, Family history not pertinant except as noted below, Social history, Allergies, and medications have been entered into the medical record, reviewed, and no changes needed.   Review of Systems: No fevers, chills, night sweats, weight loss, chest pain, or shortness of breath.   Objective:    General: Well Developed, well nourished, and in no acute distress.  Neuro: Alert and oriented x3, extra-ocular muscles intact, sensation grossly intact.  HEENT: Normocephalic, atraumatic, pupils equal round reactive to light, neck supple, no masses, no lymphadenopathy, thyroid nonpalpable.  Skin: Warm and dry, no rashes. Cardiac: Regular rate and rhythm, no murmurs rubs or gallops.  Respiratory: Clear to auscultation bilaterally. Not using accessory muscles, speaking in full sentences.  Impression and Recommendations:

## 2012-10-06 NOTE — Assessment & Plan Note (Signed)
Groggy on clonidine, discontinuing that. Adding hydralazine. Return to clinic in 2-3 weeks.

## 2012-10-20 ENCOUNTER — Ambulatory Visit (INDEPENDENT_AMBULATORY_CARE_PROVIDER_SITE_OTHER): Payer: Medicare Other | Admitting: Sports Medicine

## 2012-10-20 ENCOUNTER — Encounter: Payer: Self-pay | Admitting: Sports Medicine

## 2012-10-20 VITALS — BP 173/83 | HR 66 | Wt 166.0 lb

## 2012-10-20 DIAGNOSIS — I10 Essential (primary) hypertension: Secondary | ICD-10-CM

## 2012-10-20 MED ORDER — SPIRONOLACTONE 25 MG PO TABS: 25.0000 mg | ORAL_TABLET | Freq: Two times a day (BID) | ORAL | Status: DC

## 2012-10-20 NOTE — Assessment & Plan Note (Signed)
Intolerance now to hydralazine with continued difficult control hypertension. I'm going to add spironolactone. I would also like to check a sleep study and renal artery Dopplers. We will revisit this in 2 weeks.

## 2012-10-20 NOTE — Progress Notes (Signed)
Subjective:    CC: Followup  HPI: Hypertension: This is been very difficult to control, I currently have her on maximal doses of amlodipine, lisinopril/hydrochlorothiazide, carvedilol, we tried clonidine which she did not tolerate, we have also recently tried hydralazine which she also did not tolerate.  Unfortunately we've been unable to control this. Is however significantly improved since day one of treatment. She has never been evaluated for renal artery stenosis, and has never had a sleep study done.  Anxiety: Stable, no changes.  Hyperlipidemia: Stable, no changes.  Past medical history, Surgical history, Family history not pertinant except as noted below, Social history, Allergies, and medications have been entered into the medical record, reviewed, and no changes needed.   Review of Systems: No fevers, chills, night sweats, weight loss, chest pain, or shortness of breath.   Objective:    General: Well Developed, well nourished, and in no acute distress.  Neuro: Alert and oriented x3, extra-ocular muscles intact, sensation grossly intact.  HEENT: Normocephalic, atraumatic, pupils equal round reactive to light, neck supple, no masses, no lymphadenopathy, thyroid nonpalpable.  Skin: Warm and dry, no rashes. Cardiac: Regular rate and rhythm, no murmurs rubs or gallops.  Respiratory: Clear to auscultation bilaterally. Not using accessory muscles, speaking in full sentences.  Impression and Recommendations:

## 2012-10-24 ENCOUNTER — Telehealth: Payer: Self-pay | Admitting: *Deleted

## 2012-10-24 NOTE — Telephone Encounter (Signed)
Linda with [facility] calls and states that they nor [facility] do renal artery ultrasound so she has forwarded the order to [facility] on [address] for them to schedule. FYI

## 2012-10-31 ENCOUNTER — Telehealth: Payer: Self-pay | Admitting: *Deleted

## 2012-10-31 DIAGNOSIS — I10 Essential (primary) hypertension: Secondary | ICD-10-CM

## 2012-10-31 DIAGNOSIS — I129 Hypertensive chronic kidney disease with stage 1 through stage 4 chronic kidney disease, or unspecified chronic kidney disease: Secondary | ICD-10-CM

## 2012-10-31 NOTE — Telephone Encounter (Signed)
Added renal ultrasound to renal artery dopplers.

## 2012-11-02 ENCOUNTER — Encounter (HOSPITAL_COMMUNITY): Payer: Self-pay | Admitting: Psychiatry

## 2012-11-02 ENCOUNTER — Ambulatory Visit (INDEPENDENT_AMBULATORY_CARE_PROVIDER_SITE_OTHER): Payer: Medicare Other | Admitting: Psychiatry

## 2012-11-02 VITALS — BP 135/76 | HR 60 | Ht 64.0 in | Wt 163.0 lb

## 2012-11-02 DIAGNOSIS — F4322 Adjustment disorder with anxiety: Secondary | ICD-10-CM

## 2012-11-02 MED ORDER — BUSPIRONE HCL 5 MG PO TABS: 5.0000 mg | ORAL_TABLET | Freq: Two times a day (BID) | ORAL | Status: DC
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
## 2012-11-03 ENCOUNTER — Encounter: Payer: Self-pay | Admitting: Sports Medicine

## 2012-11-03 ENCOUNTER — Ambulatory Visit (INDEPENDENT_AMBULATORY_CARE_PROVIDER_SITE_OTHER): Payer: Medicare Other | Admitting: Sports Medicine

## 2012-11-03 VITALS — BP 144/74 | HR 68

## 2012-11-03 DIAGNOSIS — L918 Other hypertrophic disorders of the skin: Secondary | ICD-10-CM

## 2012-11-03 DIAGNOSIS — I10 Essential (primary) hypertension: Secondary | ICD-10-CM

## 2012-11-03 DIAGNOSIS — L909 Atrophic disorder of skin, unspecified: Secondary | ICD-10-CM

## 2012-11-03 MED ORDER — SPIRONOLACTONE 100 MG PO TABS: 50.0000 mg | ORAL_TABLET | Freq: Two times a day (BID) | ORAL | Status: DC

## 2012-11-03 NOTE — Assessment & Plan Note (Signed)
Greatly improved on spironolactone 25 mg twice a day in addition to amlodipine,lisinopril/hydrochlorothiazide, carvedilol. I am increasing spondylectomy 50 mg twice a day. She still needs to follow through with polysomnography. We are also awaiting renal artery Dopplers. Return in 2 weeks.

## 2012-11-03 NOTE — Assessment & Plan Note (Signed)
At this point I would like to get her set up with our facial plastic surgeon. I am going to place a referral.

## 2012-11-03 NOTE — Progress Notes (Signed)
Subjective:    CC: Followup  HPI: Hypertension: Has been very difficult to control, currently on maximal doses of carvedilol, amlodipine, lisinopril/hydrochlorothiazide, and now doing an up taper of spironolactone. She failed clonidine and hydralazine. Currently still awaiting renal artery Dopplers and sleep study.  Skin tag on face: At this point she is ready for excision.  Anxiety: Doing well on current medications and talking with Dr. Laury Deep downstairs.  Past medical history, Surgical history, Family history not pertinant except as noted below, Social history, Allergies, and medications have been entered into the medical record, reviewed, and no changes needed.   Review of Systems: No fevers, chills, night sweats, weight loss, chest pain, or shortness of breath.   Objective:    General: Well Developed, well nourished, and in no acute distress.  Neuro: Alert and oriented x3, extra-ocular muscles intact, sensation grossly intact.  HEENT: Normocephalic, atraumatic, pupils equal round reactive to light, neck supple, no masses, no lymphadenopathy, thyroid nonpalpable.  Skin: Warm and dry, no rashes. Cardiac: Regular rate and rhythm, no murmurs rubs or gallops. No lower extremity edema. Respiratory: Clear to auscultation bilaterally. Not using accessory muscles, speaking in full sentenc  Impression and Recommendations:

## 2012-11-11 ENCOUNTER — Telehealth: Payer: Self-pay | Admitting: *Deleted

## 2012-11-11 DIAGNOSIS — I10 Essential (primary) hypertension: Secondary | ICD-10-CM

## 2012-11-11 NOTE — Telephone Encounter (Signed)
Keep on the medication and lets check a potassium level.

## 2012-11-11 NOTE — Telephone Encounter (Signed)
Pt states that she is having side effects from the Spironolactone. States she is having chills and her stomach is talking and that it is discomforting to her since she has increased the medication. She would like to know your thoughts. Please advise.

## 2012-11-11 NOTE — Telephone Encounter (Signed)
LMOM for pt to go to lab get bloodwork drawn per Dr. Lucienne Minks note.

## 2012-11-12 LAB — BASIC METABOLIC PANEL WITH GFR
BUN: 23 mg/dL (ref 6–23)
CO2: 26 meq/L (ref 19–32)
Calcium: 9.5 mg/dL (ref 8.4–10.5)
Creat: 1.1 mg/dL (ref 0.50–1.10)
Glucose, Bld: 113 mg/dL — ABNORMAL HIGH (ref 70–99)
Sodium: 140 meq/L (ref 135–145)

## 2012-11-12 LAB — BASIC METABOLIC PANEL
Chloride: 104 mEq/L (ref 96–112)
Potassium: 4.3 mEq/L (ref 3.5–5.3)

## 2012-11-21 ENCOUNTER — Other Ambulatory Visit: Payer: Self-pay

## 2012-11-24 ENCOUNTER — Ambulatory Visit: Payer: Self-pay | Admitting: Sports Medicine

## 2012-11-30 ENCOUNTER — Encounter (HOSPITAL_COMMUNITY): Payer: Self-pay | Admitting: Psychiatry

## 2012-11-30 ENCOUNTER — Ambulatory Visit (INDEPENDENT_AMBULATORY_CARE_PROVIDER_SITE_OTHER): Payer: Medicare Other | Admitting: Psychiatry

## 2012-11-30 VITALS — BP 143/81 | HR 65 | Ht 64.0 in | Wt 162.0 lb

## 2012-11-30 MED ORDER — BUSPIRONE HCL 5 MG PO TABS: 5.0000 mg | ORAL_TABLET | ORAL | Status: DC
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
## 2012-12-06 DIAGNOSIS — F419 Anxiety disorder, unspecified: Secondary | ICD-10-CM | POA: Insufficient documentation

## 2012-12-06 MED ORDER — BUSPIRONE HCL 5 MG PO TABS: 5.0000 mg | ORAL_TABLET | ORAL | Status: DC

## 2012-12-28 ENCOUNTER — Encounter (HOSPITAL_COMMUNITY): Payer: Self-pay | Admitting: Psychiatry

## 2012-12-28 ENCOUNTER — Ambulatory Visit (INDEPENDENT_AMBULATORY_CARE_PROVIDER_SITE_OTHER): Payer: Medicare Other | Admitting: Psychiatry

## 2012-12-28 VITALS — BP 154/74 | HR 55 | Ht 64.0 in | Wt 166.0 lb

## 2012-12-28 DIAGNOSIS — F4323 Adjustment disorder with mixed anxiety and depressed mood: Secondary | ICD-10-CM
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
## 2013-01-17 ENCOUNTER — Encounter: Payer: Self-pay | Admitting: Sports Medicine

## 2013-01-27 ENCOUNTER — Encounter: Payer: Self-pay | Admitting: Sports Medicine

## 2013-04-22 ENCOUNTER — Other Ambulatory Visit: Payer: Self-pay | Admitting: Sports Medicine

## 2013-05-01 ENCOUNTER — Encounter (HOSPITAL_COMMUNITY): Payer: Self-pay | Admitting: Psychiatry

## 2013-05-01 ENCOUNTER — Ambulatory Visit (INDEPENDENT_AMBULATORY_CARE_PROVIDER_SITE_OTHER): Payer: Medicare Other | Admitting: Psychiatry

## 2013-05-01 VITALS — BP 142/87 | HR 61 | Ht 64.0 in | Wt 168.0 lb

## 2013-05-01 DIAGNOSIS — F4323 Adjustment disorder with mixed anxiety and depressed mood: Secondary | ICD-10-CM
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
## 2013-05-04 ENCOUNTER — Encounter (HOSPITAL_COMMUNITY): Payer: Self-pay | Admitting: Psychiatry

## 2013-05-23 ENCOUNTER — Ambulatory Visit (INDEPENDENT_AMBULATORY_CARE_PROVIDER_SITE_OTHER): Payer: Medicare Other | Admitting: Psychiatry

## 2013-05-23 ENCOUNTER — Encounter (HOSPITAL_COMMUNITY): Payer: Self-pay | Admitting: Psychiatry

## 2013-05-23 VITALS — BP 141/75 | HR 65 | Ht 64.0 in | Wt 168.0 lb

## 2013-05-23 DIAGNOSIS — F329 Major depressive disorder, single episode, unspecified: Secondary | ICD-10-CM

## 2013-05-23 DIAGNOSIS — F411 Generalized anxiety disorder: Secondary | ICD-10-CM

## 2013-05-23 MED ORDER — SERTRALINE HCL 25 MG PO TABS: ORAL_TABLET | ORAL | Status: DC

## 2013-05-23 MED ORDER — BUSPIRONE HCL 5 MG PO TABS
5.0000 mg | ORAL_TABLET | Freq: Three times a day (TID) | ORAL | Status: DC
Start: 2013-05-23 — End: 2013-06-07
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
## 2013-05-29 ENCOUNTER — Telehealth (HOSPITAL_COMMUNITY): Payer: Self-pay

## 2013-05-29 NOTE — Telephone Encounter (Signed)
Pt having side effects to zoloft, restlessness, tremors, panic attacks. Pt told Dr. Laury Deep out of office today and to discontinue medication and he or partner would call as soon as possible.

## 2013-05-29 NOTE — Telephone Encounter (Signed)
Zoloft x 1 week.  Panic attacks, shaking a lot.  Stop Zoloft.  Try 1/2 Benedryl for restlessness.Dr. Laury Deep will call tomorrow.

## 2013-05-30 ENCOUNTER — Telehealth (HOSPITAL_COMMUNITY): Payer: Self-pay

## 2013-05-30 NOTE — Telephone Encounter (Signed)
Called patient.  Asked her to continue buspirone, asked patient to call back in three days.

## 2013-05-30 NOTE — Telephone Encounter (Signed)
Spoke with patient.

## 2013-06-01 ENCOUNTER — Telehealth (HOSPITAL_COMMUNITY): Payer: Self-pay

## 2013-06-01 NOTE — Telephone Encounter (Signed)
Pt would like you to call her today

## 2013-06-01 NOTE — Telephone Encounter (Signed)
Called patient. Discontinue Venlafaxine. At this point, asked patient to increase Buspirone to 10 mg BID.  Patient is supposed to call in one week.

## 2013-06-01 NOTE — Telephone Encounter (Signed)
First attempt to call. Patient left message stating I would call her at the end of the day.

## 2013-06-02 ENCOUNTER — Telehealth (HOSPITAL_COMMUNITY): Payer: Self-pay

## 2013-06-02 NOTE — Telephone Encounter (Signed)
Called patient. She reports that she is doing better with increased dose of Buspirone 10 mg TID, and using benadryl 25 mg for sleep. Will continue the same asked her to call on Wednesday.

## 2013-06-02 NOTE — Telephone Encounter (Signed)
Called patient. Left message stating I would call back on Wednesday.

## 2013-06-02 NOTE — Telephone Encounter (Signed)
Pt ask if you cannot get her at the home number call cell [phone]

## 2013-06-07 ENCOUNTER — Telehealth (HOSPITAL_COMMUNITY): Payer: Self-pay

## 2013-06-07 MED ORDER — BUSPIRONE HCL 10 MG PO TABS: 10.0000 mg | ORAL_TABLET | Freq: Three times a day (TID) | ORAL | Status: DC

## 2013-06-07 MED ORDER — BUSPIRONE HCL 10 MG PO TABS
10.0000 mg | ORAL_TABLET | Freq: Three times a day (TID) | ORAL | Status: DC
Start: 2013-06-07 — End: 2013-06-07

## 2013-06-07 NOTE — Telephone Encounter (Signed)
She reports that she is doing well at Buspirone 10 mg TID. Will continue current dosage.

## 2013-06-15 ENCOUNTER — Telehealth (HOSPITAL_COMMUNITY): Payer: Self-pay

## 2013-06-15 NOTE — Telephone Encounter (Signed)
Patient reports she is doing better. With Buspirone 10 mg TID, will not make any further increase at this time.

## 2013-06-27 ENCOUNTER — Telehealth (HOSPITAL_COMMUNITY): Payer: Self-pay

## 2013-06-27 DIAGNOSIS — F4323 Adjustment disorder with mixed anxiety and depressed mood: Secondary | ICD-10-CM

## 2013-06-27 MED ORDER — BUSPIRONE HCL 15 MG PO TABS: 15.0000 mg | ORAL_TABLET | Freq: Three times a day (TID) | ORAL | Status: DC

## 2013-06-27 NOTE — Telephone Encounter (Signed)
The patient reports that she continues to feel nervous.  She denies any side effects.    PLAN: Increase Buspirone 15 mg TID. Asked patient to call in one week for result.

## 2013-06-29 ENCOUNTER — Ambulatory Visit (INDEPENDENT_AMBULATORY_CARE_PROVIDER_SITE_OTHER): Payer: Medicare Other

## 2013-06-29 ENCOUNTER — Encounter: Payer: Self-pay | Admitting: Sports Medicine

## 2013-06-29 ENCOUNTER — Ambulatory Visit (INDEPENDENT_AMBULATORY_CARE_PROVIDER_SITE_OTHER): Payer: Medicare Other | Admitting: Sports Medicine

## 2013-06-29 VITALS — BP 120/70 | HR 68 | Wt 165.0 lb

## 2013-06-29 DIAGNOSIS — M545 Low back pain: Secondary | ICD-10-CM

## 2013-06-29 DIAGNOSIS — L918 Other hypertrophic disorders of the skin: Secondary | ICD-10-CM

## 2013-06-29 DIAGNOSIS — R200 Anesthesia of skin: Secondary | ICD-10-CM | POA: Insufficient documentation

## 2013-06-29 DIAGNOSIS — Z1231 Encounter for screening mammogram for malignant neoplasm of breast: Secondary | ICD-10-CM

## 2013-06-29 DIAGNOSIS — Z0000 Encounter for general adult medical examination without abnormal findings: Secondary | ICD-10-CM

## 2013-06-29 DIAGNOSIS — M5030 Other cervical disc degeneration, unspecified cervical region: Secondary | ICD-10-CM

## 2013-06-29 DIAGNOSIS — Z299 Encounter for prophylactic measures, unspecified: Secondary | ICD-10-CM

## 2013-06-29 DIAGNOSIS — F4323 Adjustment disorder with mixed anxiety and depressed mood: Secondary | ICD-10-CM

## 2013-06-29 DIAGNOSIS — E785 Hyperlipidemia, unspecified: Secondary | ICD-10-CM

## 2013-06-29 DIAGNOSIS — I10 Essential (primary) hypertension: Secondary | ICD-10-CM

## 2013-06-29 DIAGNOSIS — M5020 Other cervical disc displacement, unspecified cervical region: Secondary | ICD-10-CM

## 2013-06-29 LAB — COMPREHENSIVE METABOLIC PANEL WITH GFR
ALT: 22 U/L (ref 0–35)
AST: 20 U/L (ref 0–37)
CO2: 28 meq/L (ref 19–32)
Chloride: 105 meq/L (ref 96–112)

## 2013-06-29 LAB — COMPREHENSIVE METABOLIC PANEL
Albumin: 4.6 g/dL (ref 3.5–5.2)
Alkaline Phosphatase: 65 U/L (ref 39–117)
BUN: 18 mg/dL (ref 6–23)
Calcium: 9.7 mg/dL (ref 8.4–10.5)
Creat: 1.04 mg/dL (ref 0.50–1.10)
Glucose, Bld: 121 mg/dL — ABNORMAL HIGH (ref 70–99)
Potassium: 4.2 mEq/L (ref 3.5–5.3)
Sodium: 142 mEq/L (ref 135–145)
Total Bilirubin: 0.7 mg/dL (ref 0.3–1.2)
Total Protein: 7.1 g/dL (ref 6.0–8.3)

## 2013-06-29 LAB — LIPID PANEL
Cholesterol: 160 mg/dL (ref 0–200)
HDL: 65 mg/dL (ref >39)
LDL Cholesterol: 75 mg/dL (ref 0–99)
Total CHOL/HDL Ratio: 2.5 Ratio
Triglycerides: 98 mg/dL (ref <150)
VLDL: 20 mg/dL (ref 0–40)

## 2013-06-29 MED ORDER — AMLODIPINE BESYLATE 10 MG PO TABS: 10.0000 mg | ORAL_TABLET | Freq: Every day | ORAL | Status: DC

## 2013-06-29 MED ORDER — LISINOPRIL 20 MG PO TABS: 20.0000 mg | ORAL_TABLET | Freq: Every day | ORAL | Status: DC

## 2013-06-29 MED ORDER — LISINOPRIL-HYDROCHLOROTHIAZIDE 20-25 MG PO TABS: 1.0000 | ORAL_TABLET | Freq: Every day | ORAL | Status: DC

## 2013-06-29 MED ORDER — ATORVASTATIN CALCIUM 40 MG PO TABS: 40.0000 mg | ORAL_TABLET | Freq: Every day | ORAL | Status: DC

## 2013-06-29 MED ORDER — CARVEDILOL 25 MG PO TABS: 25.0000 mg | ORAL_TABLET | Freq: Two times a day (BID) | ORAL | Status: DC

## 2013-06-29 NOTE — Assessment & Plan Note (Signed)
Up-to-date on flu shot, ordering mammogram. Desires to hold off on colonoscopy for now.

## 2013-06-29 NOTE — Assessment & Plan Note (Signed)
Positional with her hands, and legs. 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 Lumbar and cervical spine x-rays. Home exercises. Return in 4 weeks.

## 2013-06-29 NOTE — Assessment & Plan Note (Signed)
Stable on Lipitor, rechecking lipids.

## 2013-06-29 NOTE — Assessment & Plan Note (Signed)
Stable on BuSpar, sees Dr. Laury Deep downstairs.

## 2013-06-29 NOTE — Assessment & Plan Note (Signed)
Likely iatrogenic. Continue lisinopril 40, hydrochlorothiazide 25, Coreg 25, Norvasc 10. Her blood pressures at home always run normal, and occasionally they run low, so we will not make any changes to her medications. She will check her blood pressure before she comes in, and we will use that as our readings.

## 2013-06-29 NOTE — Progress Notes (Signed)
Subjective:    Kristy Hays is a 67 y.o. female who presents for Medicare Annual/Subsequent preventive examination.  Preventive Screening-Counseling & Management  Tobacco History  Smoking status  . Former Smoker  Smokeless tobacco  . Not on file    Comment: Quit 24 years ago,     Problems Prior to Visit 1.   Current Problems (verified) Patient Active Problem List   Diagnosis Date Noted  . Numbness of extremity 06/29/2013  . Preventive measure 08/09/2012  . Hyperlipidemia 07/07/2012  . Adjustment disorder with mixed anxiety and depressed mood 07/07/2012  . Hypertension 07/05/2012  . Skin tag, face 07/05/2012    Medications Prior to Visit Current Outpatient Prescriptions on File Prior to Visit  Medication Sig Dispense Refill  . busPIRone (BUSPAR) 15 MG tablet Take 1 tablet (15 mg total) by mouth 3 (three) times daily.  90 tablet  1  . fish oil-omega-3 fatty acids 1000 MG capsule Take 1 g by mouth daily.      . Travoprost, BAK Free, (TRAVATAN) 0.004 % SOLN ophthalmic solution 1 drop at bedtime.       No current facility-administered medications on file prior to visit.    Current Medications (verified) Current Outpatient Prescriptions  Medication Sig Dispense Refill  . amLODipine (NORVASC) 10 MG tablet Take 1 tablet (10 mg total) by mouth daily.  90 tablet  3  . atorvastatin (LIPITOR) 40 MG tablet Take 1 tablet (40 mg total) by mouth daily.  90 tablet  3  . busPIRone (BUSPAR) 15 MG tablet Take 1 tablet (15 mg total) by mouth 3 (three) times daily.  90 tablet  1  . carvedilol (COREG) 25 MG tablet Take 1 tablet (25 mg total) by mouth 2 (two) times daily with a meal.  180 tablet  3  . fish oil-omega-3 fatty acids 1000 MG capsule Take 1 g by mouth daily.      Marland Kitchen lisinopril (PRINIVIL,ZESTRIL) 20 MG tablet Take 1 tablet (20 mg total) by mouth daily.  90 tablet  3  . lisinopril-hydrochlorothiazide (PRINZIDE,ZESTORETIC) 20-25 MG per tablet Take 1 tablet by mouth daily.  90  tablet  3  . Travoprost, BAK Free, (TRAVATAN) 0.004 % SOLN ophthalmic solution 1 drop at bedtime.       No current facility-administered medications for this visit.     Allergies (verified) Antihistamines, chlorpheniramine-type and Penicillins   PAST HISTORY  Family History Family History  Problem Relation Age of Onset  . Cancer Neg Hx   . Heart attack Mother   . Hypertension Mother   . Hypertension Sister     Social History History  Substance Use Topics  . Smoking status: Former Games developer  . Smokeless tobacco: Not on file     Comment: Quit 24 years ago,  . Alcohol Use: Yes     Comment: Very seldom-5 times a year     Are there smokers in your home (other than you)? No  Risk Factors Current exercise habits: The patient does not participate in regular exercise at present.  Dietary issues discussed: Yes   Cardiac risk factors: dyslipidemia, hypertension and sedentary lifestyle.  Depression Screen (Note: if answer to either of the following is "Yes", a more complete depression screening is indicated)   Over the past two weeks, have you felt down, depressed or hopeless? Yes  Over the past two weeks, have you felt little interest or pleasure in doing things? No  Have you lost interest or pleasure in daily life? No  Do you often feel hopeless? No  Do you cry easily over simple problems? No  Activities of Daily Living In your present state of health, do you have any difficulty performing the following activities?:  Driving? Yes Managing money?  No Feeding yourself? No Getting from bed to chair? No Climbing a flight of stairs? No Preparing food and eating?: No Bathing or showering? No Getting dressed: No Getting to the toilet? No Using the toilet:No Moving around from place to place: No In the past year have you fallen or had a near fall?:No   Are you sexually active?  No  Do you have more than one partner?  No  Hearing Difficulties: No Do you often ask people to  speak up or repeat themselves? No Do you experience ringing or noises in your ears? No Do you have difficulty understanding soft or whispered voices? No   Do you feel that you have a problem with memory? No  Do you often misplace items? No  Do you feel safe at home?  Yes  Cognitive Testing  Alert? Yes  Normal Appearance?Yes  Oriented to person? Yes  Place? Yes   Time? Yes  Recall of three objects?  Yes  Can perform simple calculations? Yes  Displays appropriate judgment?Yes  Can read the correct time from a watch face?Yes   Advanced Directives have been discussed with the patient? No  List the Names of Other Physician/Practitioners you currently use: 1.    Indicate any recent Medical Services you may have received from other than [facility] providers in the past year (date may be approximate).  Immunization History  Administered Date(s) Administered  . Influenza Split 05/15/2012  . Pneumococcal Polysaccharide 08/09/2012  . Tdap 09/14/1998, 08/09/2012  . Zoster 08/09/2012    Screening Tests Health Maintenance  Topic Date Due  . Mammogram  03/26/1995  . Colonoscopy  03/26/1995  . Influenza Vaccine  04/14/2014  . Tetanus/tdap  08/09/2022  . Pneumococcal Polysaccharide Vaccine Age 84 And Over  Completed  . Zostavax  Completed    All answers were reviewed with the patient and necessary referrals were made:  Rodney Langton, MD   06/29/2013   History reviewed: allergies, current medications, past family history, past medical history, past social history, past surgical history and problem list  Review of Systems A comprehensive review of systems was negative.    Objective:     Vision by Snellen chart: right eye:20/20, left eye:20/20  Body mass index is 28.31 kg/(m^2). BP 120/70  Pulse 68  Wt 165 lb (74.844 kg)  BMI 28.31 kg/m2  General Appearance: Alert, well developed and well nourished, cooperative, no distress.  Head: Normocephalic, no obvious abnormality    Eyes: PERRL, EOM's intact, conjunctiva and corneas clear.  Nose: Nares symmetrical, septum midline, mucosa pink, clear watery discharge; no sinus tenderness  Throat: Lips, tongue, and mucosa are moist, pink, and intact; teeth intact  Neck: Supple, symmetrical, trachea midline, no adenopathy; thyroid: no enlargement, symmetric,no tenderness/mass/nodules; no carotid bruit, no JVD  Back: Symmetrical, no curvature, ROM normal, no CVA tenderness  Chest/Breast: No mass or tenderness  Lungs: Clear to auscultation bilaterally, respirations unlabored  Heart: Normal PMI, no lower extremity edema, regular rate & rhythm, S1 and S2 normal, no murmurs, rubs, or gallops. Abdomen: Soft, non-tender, bowel sounds active all four quadrants, no mass, or organomegaly  Musculoskeletal: All joints, extremities examined, non-tender and unremarkable.  Lymphatic: No adenopathy  Skin/Hair/Nails: Skin warm, dry, and intact, no rashes or abnormal dyspigmentation  Neurologic: Alert and oriented x3, no cranial nerve deficits, normal strength and tone, gait steady      Assessment:     Healthy female with hypertension, hyperlipidemia.     Plan:     During the course of the visit the patient was educated and counseled about appropriate screening and preventive services including:    Influenza vaccine  Screening mammography  Diet review for nutrition referral? Yes ____  Not Indicated _x___   Patient Instructions (the written plan) was given to the patient.  Medicare Attestation I have personally reviewed: The patient's medical and social history Their use of alcohol, tobacco or illicit drugs Their current medications and supplements The patient's functional ability including ADLs,fall risks, home safety risks, cognitive, and hearing and visual impairment Diet and physical activities Evidence for depression or mood disorders  The patient's weight, height, BMI, and visual acuity have been recorded in the  chart.  I have made referrals, counseling, and provided education to the patient based on review of the above and I have provided the patient with a written personalized care plan for preventive services.     Rodney Langton, MD   06/29/2013

## 2013-06-30 ENCOUNTER — Telehealth (HOSPITAL_COMMUNITY): Payer: Self-pay

## 2013-06-30 MED ORDER — SERTRALINE HCL 25 MG PO TABS: 12.5000 mg | ORAL_TABLET | Freq: Every day | ORAL | Status: DC

## 2013-06-30 NOTE — Telephone Encounter (Signed)
Called patient. She feels like she is getting more anxious despite increase in buspirone.  PLAN: Restart Sertraline 25 mg- half-tablet daily for 2 weeks then call to report results. Unclear if panic attack in the past was related to Zoloft.

## 2013-06-30 NOTE — Telephone Encounter (Signed)
Pt says medication is not doing the trick

## 2013-07-05 ENCOUNTER — Telehealth (HOSPITAL_COMMUNITY): Payer: Self-pay

## 2013-07-05 NOTE — Telephone Encounter (Signed)
Returned call.  Zoloft making her ansy at 12.5 mg.  patient is willing to continue Zoloft. I will send in a refill.

## 2013-07-05 NOTE — Telephone Encounter (Signed)
Pt states new medication is having side effects would like to speak to someone asap

## 2013-07-06 ENCOUNTER — Telehealth (HOSPITAL_COMMUNITY): Payer: Self-pay

## 2013-07-06 MED ORDER — FLUOXETINE HCL 10 MG PO TABS: 5.0000 mg | ORAL_TABLET | Freq: Every day | ORAL | Status: DC

## 2013-07-06 NOTE — Telephone Encounter (Signed)
Stop Zoloft.  Start Prozac 5 mg.  Use Benedryl 1/2 tab q6 hours for anxiety.

## 2013-07-07 ENCOUNTER — Telehealth (HOSPITAL_COMMUNITY): Payer: Self-pay | Admitting: *Deleted

## 2013-07-07 MED ORDER — CLONAZEPAM 0.5 MG PO TABS: 0.2500 mg | ORAL_TABLET | Freq: Two times a day (BID) | ORAL | Status: DC | PRN

## 2013-07-07 NOTE — Telephone Encounter (Signed)
Pt sts that she was started on prozac this week and " is very jitter on the inside". Pt sts that she needs to be seen today.

## 2013-07-07 NOTE — Telephone Encounter (Signed)
Jittery when wakes up in the morning.

## 2013-07-07 NOTE — Telephone Encounter (Signed)
Benedryl to sleep.  Don't think it is Prozac.  No reason to be this way.  No history of abuse.  Start Klonapin 0.25 mg bid.

## 2013-07-07 NOTE — Addendum Note (Signed)
Addended by: Elaina Pattee on: 07/07/2013 08:38 AM   Modules accepted: Orders

## 2013-07-10 ENCOUNTER — Encounter: Payer: Self-pay | Admitting: Emergency Medicine

## 2013-07-10 ENCOUNTER — Emergency Department (INDEPENDENT_AMBULATORY_CARE_PROVIDER_SITE_OTHER)
Admission: EM | Admit: 2013-07-10 | Discharge: 2013-07-10 | Disposition: A | Discharge status: Home / Self Care | Payer: Medicare Other | Source: Home / Self Care | Attending: Emergency Medicine | Admitting: Emergency Medicine

## 2013-07-10 DIAGNOSIS — L280 Lichen simplex chronicus: Secondary | ICD-10-CM

## 2013-07-10 HISTORY — DX: Major depressive disorder, single episode, unspecified: F32.9

## 2013-07-10 HISTORY — DX: Anxiety disorder, unspecified: F41.9

## 2013-07-10 HISTORY — DX: Hyperlipidemia, unspecified: E78.5

## 2013-07-10 HISTORY — DX: Depression, unspecified: F32.A

## 2013-07-10 MED ORDER — HYDROXYZINE HCL 10 MG PO TABS: 10.0000 mg | ORAL_TABLET | Freq: Four times a day (QID) | ORAL | Status: DC | PRN

## 2013-07-10 NOTE — ED Notes (Signed)
Pt c/o increased anxiety and she reports that her psychiatrist, Dr. Laury Deep, is out of the office. She reports having allergic reaction to antihistamine with rash on her neck.

## 2013-07-10 NOTE — ED Provider Notes (Signed)
CSN: [id number]     Arrival date & time 07/10/13  0840 History   First MD Initiated Contact with Patient 07/10/13 [phone]     Chief Complaint  Patient presents with  . Anxiety    Patient is a 67 y.o. female presenting with anxiety. The history is provided by the patient (And her adult daughter).  Anxiety This is a recurrent problem. Episode onset: This has been chronic, but worse the past week. The problem occurs daily. The problem has been gradually worsening. Pertinent negatives include no chest pain, no abdominal pain, no headaches and no shortness of breath. Exacerbated by: Patient unsure. Nothing relieves the symptoms.   Pt c/o increased anxiety and she reports that her psychiatrist, Dr. Laury Deep, is out of the office. She reports having "allergic reaction" to Benadryl, last week with a mildly pruritic rash on her neck. However, she's been taking Benadryl at bedtime for 4 weeks, making true allergic reaction to Benadryl very unlikely.  Her psychiatrist has been adjusting her SSRI. Was on sertraline 12.5 mg daily from 10/18, but that was [facility] 10/23 . Then, about 4 days ago, she was started on Prozac 10 mg daily and clonazepam twice a day. However, the daughter reports that she is not taking the clonazepam during the day and only taking one half of the prescribed clonazepam pill at bedtime.--Still having insomnia and anxiety.  She denies suicidal or homicidal ideation. Denies focal weakness or numbness in extremities. Complains of mild pruritic rash on neck associated with vague numbness on chin. No change in vision.  Past Medical History  Diagnosis Date  . Glaucoma   . Hypertension   . Hyperlipidemia   . Anxiety   . Depression    Past Surgical History  Procedure Laterality Date  . Abdominal hysterectomy     Family History  Problem Relation Age of Onset  . Cancer Neg Hx   . Heart attack Mother   . Hypertension Mother   . Hypertension Sister    History  Substance Use Topics   . Smoking status: Former Games developer  . Smokeless tobacco: Not on file     Comment: Quit 24 years ago,  . Alcohol Use: No     Comment: Very seldom-5 times a year   OB History   Grav Para Term Preterm Abortions TAB SAB Ect Mult Living                 Review of Systems  HENT: Negative for trouble swallowing.   Respiratory: Negative.  Negative for shortness of breath, wheezing and stridor.   Cardiovascular: Negative for chest pain, palpitations and leg swelling.  Gastrointestinal: Negative for abdominal pain.  Skin: Positive for rash.  Neurological: Negative for seizures, syncope and headaches.  Psychiatric/Behavioral: Positive for sleep disturbance. Negative for hallucinations. The patient is nervous/anxious.   All other systems reviewed and are negative.    Allergies  Antihistamines, chlorpheniramine-type and Penicillins  Home Medications   Current Outpatient Rx  Name  Route  Sig  Dispense  Refill  . amLODipine (NORVASC) 10 MG tablet   Oral   Take 1 tablet (10 mg total) by mouth daily.   90 tablet   3   . atorvastatin (LIPITOR) 40 MG tablet   Oral   Take 1 tablet (40 mg total) by mouth daily.   90 tablet   3   . busPIRone (BUSPAR) 15 MG tablet   Oral   Take 1 tablet (15 mg total) by mouth 3 (three) times  daily.   90 tablet   1     Please discontinue all previous prescriptions for  ...   . carvedilol (COREG) 25 MG tablet   Oral   Take 1 tablet (25 mg total) by mouth 2 (two) times daily with a meal.   180 tablet   3   . clonazePAM (KLONOPIN) 0.5 MG tablet   Oral   Take 0.5 tablets (0.25 mg total) by mouth 2 (two) times daily as needed for anxiety.   20 tablet   0   . fish oil-omega-3 fatty acids 1000 MG capsule   Oral   Take 1 g by mouth daily.         Marland Kitchen FLUoxetine (PROZAC) 10 MG tablet   Oral   Take 0.5 tablets (5 mg total) by mouth daily.   15 tablet   0   . hydrOXYzine (ATARAX/VISTARIL) 10 MG tablet   Oral   Take 1 tablet (10 mg total) by mouth  every 6 (six) hours as needed for itching or anxiety.   21 tablet   0   . lisinopril (PRINIVIL,ZESTRIL) 20 MG tablet   Oral   Take 1 tablet (20 mg total) by mouth daily.   90 tablet   3   . lisinopril-hydrochlorothiazide (PRINZIDE,ZESTORETIC) 20-25 MG per tablet   Oral   Take 1 tablet by mouth daily.   90 tablet   3   . Travoprost, BAK Free, (TRAVATAN) 0.004 % SOLN ophthalmic solution      1 drop at bedtime.          BP 148/81  Pulse 75  Temp(Src) 98.1 F (36.7 C) (Oral)  Resp 16  Wt 163 lb (73.936 kg)  BMI 27.97 kg/m2  SpO2 99% Physical Exam  Nursing note and vitals reviewed. Constitutional: She is oriented to person, place, and time. She appears well-developed and well-nourished.  Non-toxic appearance. No distress.  Cooperative. Moderately anxious.  HENT:  Head: Normocephalic and atraumatic.  Mouth/Throat: Oropharynx is clear and moist. No oropharyngeal exudate.  Eyes: Conjunctivae and EOM are normal. Pupils are equal, round, and reactive to light. No scleral icterus.  Neck: Normal range of motion. Neck supple. No JVD present. No tracheal deviation present.  Cardiovascular: Normal rate, regular rhythm and normal heart sounds.   No murmur heard. Pulmonary/Chest: Effort normal and breath sounds normal. No respiratory distress.  Abdominal: Soft. She exhibits no distension. There is no tenderness.  Musculoskeletal: Normal range of motion.  Lymphadenopathy:    She has no cervical adenopathy.  Neurological: She is alert and oriented to person, place, and time. No cranial nerve deficit. She exhibits normal muscle tone.  Skin: Skin is warm. Rash noted. Rash is urticarial (Faint, erythematous, blanching, urticarial rash on neck).  Psychiatric: Her mood appears anxious. She is not actively hallucinating.    ED Course  Procedures (including critical care time) Labs Review Labs Reviewed - No data to display Imaging Review No results found.  EKG Interpretation      Ventricular Rate:    PR Interval:    QRS Duration:   QT Interval:    QTC Calculation:   R Axis:     Text Interpretation:              MDM   1. Local neurodermatitis    Likely has neurodermatitis on neck, most likely related to anxiety. I doubt true allergic reaction to any medication. We discussed treatment options at length.--With patient and her adult daughter. After risks, benefits,  alternatives discussed, we agree with the following plans: Continue current medications as prescribed by her psychiatrist. I urged her to take a full dose of clonazepam at bedtime, as she's only been taking one half at bedtime. New prescription today: Hydroxyzine 10 mg by mouth every 6 hours when necessary itch, rash, or anxiety.--She understands that we will not refill this, as am prescribing this for acute symptoms. Questions invited and answered. Written instructions given. Followup with Dr  Laury Deep within one week . Precautions discussed. Red flags discussed. Questions invited and answered. Patient voiced understanding and agreement.     Lajean Manes, MD 07/10/13 1050

## 2013-07-14 ENCOUNTER — Telehealth (HOSPITAL_COMMUNITY): Payer: Self-pay

## 2013-07-14 NOTE — Telephone Encounter (Signed)
Stoke to daughter.  Still very anxious.  Daughter feels shes getting worse.  Last year had anxiety- blamed it on hypertention.  A few friends recently died. Given Vistaril at urgent care.  Taking Klonapin.  Dr. Laury Deep will see her Monday at 2:30.

## 2013-07-14 NOTE — Telephone Encounter (Signed)
Pt daughter would like to talk to you. Pt is getting worse by the day.

## 2013-07-17 ENCOUNTER — Encounter (HOSPITAL_COMMUNITY): Payer: Self-pay | Admitting: Psychiatry

## 2013-07-17 ENCOUNTER — Ambulatory Visit (INDEPENDENT_AMBULATORY_CARE_PROVIDER_SITE_OTHER): Payer: Medicare Other | Admitting: Psychiatry

## 2013-07-17 VITALS — BP 138/75 | HR 74 | Ht 64.0 in | Wt 162.0 lb

## 2013-07-17 DIAGNOSIS — F4323 Adjustment disorder with mixed anxiety and depressed mood: Secondary | ICD-10-CM

## 2013-07-17 MED ORDER — HYDROXYZINE HCL 10 MG PO TABS: 10.0000 mg | ORAL_TABLET | Freq: Four times a day (QID) | ORAL | Status: DC | PRN

## 2013-07-17 MED ORDER — TRAZODONE HCL 50 MG PO TABS: ORAL_TABLET | ORAL | Status: DC

## 2013-07-17 MED ORDER — BUSPIRONE HCL 10 MG PO TABS: 10.0000 mg | ORAL_TABLET | Freq: Three times a day (TID) | ORAL | Status: DC

## 2013-07-17 MED ORDER — SERTRALINE HCL 25 MG PO TABS: 25.0000 mg | ORAL_TABLET | Freq: Every day | ORAL | Status: DC
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
## 2013-07-17 NOTE — Telephone Encounter (Signed)
Acknowledged.

## 2013-07-18 ENCOUNTER — Telehealth: Payer: Self-pay

## 2013-07-18 NOTE — Telephone Encounter (Signed)
Patient called stated that she is having B/P problems. She stated that she gets dizzy her B/P readings are 99/71, 94/66, 92/61. She wanst to know what she needs to do about the dizziness. Ailany Koren,CMA

## 2013-07-18 NOTE — Telephone Encounter (Signed)
What was the pulse rate during these episodes, she can go ahead and decrease amlodipine to one half tablet daily, this will be 5 mg.

## 2013-07-19 ENCOUNTER — Telehealth (HOSPITAL_COMMUNITY): Payer: Self-pay

## 2013-07-19 NOTE — Telephone Encounter (Signed)
Will increase trazodone to 150 mg at bedtime, may take an additional 50 mg if not sleeping in 2 hours may take another.   Will increase hydroxyzine 20 mg QID PRN.

## 2013-07-19 NOTE — Telephone Encounter (Signed)
Patient stated that her b/p was 109/67 60, 99/71 71, 115/67,60. She stated that she will try the half tablet and call us back on Friday with the readings. Rhonda Cunningham,CMA

## 2013-07-20 ENCOUNTER — Telehealth (HOSPITAL_COMMUNITY): Payer: Self-pay

## 2013-07-20 DIAGNOSIS — F4323 Adjustment disorder with mixed anxiety and depressed mood: Secondary | ICD-10-CM

## 2013-07-20 NOTE — Telephone Encounter (Signed)
Pt states things are going well

## 2013-07-20 NOTE — Telephone Encounter (Signed)
Patient stated that she believes her B/P is up and down due to the beta blocker that she is taking she has cut her B/P medication down to 5 mg she do have an appt set up for next week. Colvin Blatt,CMA

## 2013-07-20 NOTE — Telephone Encounter (Signed)
Patient reports she is doing better with hydroxyzine 20 mg  BID and slept well last night. Will monitor patient.

## 2013-07-21 ENCOUNTER — Telehealth (HOSPITAL_COMMUNITY): Payer: Self-pay

## 2013-07-21 ENCOUNTER — Other Ambulatory Visit (HOSPITAL_COMMUNITY): Payer: Self-pay | Admitting: Psychiatry

## 2013-07-21 MED ORDER — MIRTAZAPINE 30 MG PO TABS: 30.0000 mg | ORAL_TABLET | Freq: Every day | ORAL | Status: DC

## 2013-07-21 NOTE — Telephone Encounter (Signed)
The patient reports that her mood has been up and down. She still has anxiety. Will increase sertraline to 50 mg QHS. Stop hydroxyzine and trazodone.  Start Mirtazapine 30 mg daily.

## 2013-07-23 ENCOUNTER — Encounter (HOSPITAL_BASED_OUTPATIENT_CLINIC_OR_DEPARTMENT_OTHER): Payer: Self-pay | Admitting: Emergency Medicine

## 2013-07-23 ENCOUNTER — Emergency Department (HOSPITAL_BASED_OUTPATIENT_CLINIC_OR_DEPARTMENT_OTHER)
Admission: EM | Admit: 2013-07-23 | Discharge: 2013-07-23 | Disposition: A | Discharge status: Home / Self Care | Payer: Medicare Other | Attending: Emergency Medicine | Admitting: Emergency Medicine

## 2013-07-23 DIAGNOSIS — Z8669 Personal history of other diseases of the nervous system and sense organs: Secondary | ICD-10-CM | POA: Insufficient documentation

## 2013-07-23 DIAGNOSIS — Z880 Allergy status to penicillin: Secondary | ICD-10-CM | POA: Insufficient documentation

## 2013-07-23 DIAGNOSIS — Z87891 Personal history of nicotine dependence: Secondary | ICD-10-CM | POA: Insufficient documentation

## 2013-07-23 DIAGNOSIS — R630 Anorexia: Secondary | ICD-10-CM | POA: Insufficient documentation

## 2013-07-23 DIAGNOSIS — Z79899 Other long term (current) drug therapy: Secondary | ICD-10-CM | POA: Insufficient documentation

## 2013-07-23 DIAGNOSIS — E785 Hyperlipidemia, unspecified: Secondary | ICD-10-CM | POA: Insufficient documentation

## 2013-07-23 DIAGNOSIS — F3289 Other specified depressive episodes: Secondary | ICD-10-CM | POA: Insufficient documentation

## 2013-07-23 DIAGNOSIS — N390 Urinary tract infection, site not specified: Secondary | ICD-10-CM

## 2013-07-23 DIAGNOSIS — F39 Unspecified mood [affective] disorder: Secondary | ICD-10-CM | POA: Insufficient documentation

## 2013-07-23 DIAGNOSIS — I10 Essential (primary) hypertension: Secondary | ICD-10-CM | POA: Insufficient documentation

## 2013-07-23 DIAGNOSIS — F419 Anxiety disorder, unspecified: Secondary | ICD-10-CM

## 2013-07-23 DIAGNOSIS — R109 Unspecified abdominal pain: Secondary | ICD-10-CM | POA: Insufficient documentation

## 2013-07-23 DIAGNOSIS — F411 Generalized anxiety disorder: Secondary | ICD-10-CM | POA: Insufficient documentation

## 2013-07-23 DIAGNOSIS — F329 Major depressive disorder, single episode, unspecified: Secondary | ICD-10-CM | POA: Insufficient documentation

## 2013-07-23 DIAGNOSIS — F919 Conduct disorder, unspecified: Secondary | ICD-10-CM | POA: Insufficient documentation

## 2013-07-23 LAB — CBC WITH DIFFERENTIAL/PLATELET
Basophils Relative: 0 % (ref 0–1)
Eosinophils Absolute: 0.1 10*3/uL (ref 0.0–0.7)
Eosinophils Relative: 1 % (ref 0–5)
HCT: 39.4 % (ref 36.0–46.0)
Lymphs Abs: 1.5 10*3/uL (ref 0.7–4.0)
MCH: 31.4 pg (ref 26.0–34.0)
MCHC: 34.5 g/dL (ref 30.0–36.0)
MCV: 91 fL (ref 78.0–100.0)
Monocytes Absolute: 0.6 10*3/uL (ref 0.1–1.0)
Monocytes Relative: 6 % (ref 3–12)
Platelets: 259 10*3/uL (ref 150–400)
RBC: 4.33 MIL/uL (ref 3.87–5.11)
RDW: 12.7 % (ref 11.5–15.5)

## 2013-07-23 LAB — BASIC METABOLIC PANEL
BUN: 21 mg/dL (ref 6–23)
Calcium: 9.5 mg/dL (ref 8.4–10.5)
Creatinine, Ser: 1 mg/dL (ref 0.50–1.10)
GFR calc non Af Amer: 57 mL/min — ABNORMAL LOW (ref >90)
Glucose, Bld: 130 mg/dL — ABNORMAL HIGH (ref 70–99)

## 2013-07-23 LAB — URINALYSIS, ROUTINE W REFLEX MICROSCOPIC
Bilirubin Urine: NEGATIVE
Glucose, UA: NEGATIVE mg/dL
Hgb urine dipstick: NEGATIVE
Protein, ur: NEGATIVE mg/dL
Urobilinogen, UA: 1 mg/dL (ref 0.0–1.0)

## 2013-07-23 LAB — URINE MICROSCOPIC-ADD ON

## 2013-07-23 MED ORDER — SULFAMETHOXAZOLE-TMP DS 800-160 MG PO TABS: 1.0000 | ORAL_TABLET | Freq: Two times a day (BID) | ORAL | Status: DC

## 2013-07-23 MED ORDER — LORAZEPAM 1 MG PO TABS: 1.0000 mg | ORAL_TABLET | Freq: Four times a day (QID) | ORAL | Status: DC | PRN

## 2013-07-23 MED ORDER — LORAZEPAM 1 MG PO TABS
1.0000 mg | ORAL_TABLET | Freq: Once | ORAL | Status: AC
  Administered 2013-07-23: 1 mg via ORAL
  Filled 2013-07-23: qty 1

## 2013-07-23 MED ORDER — LORAZEPAM 1 MG PO TABS: 1.0000 mg | ORAL_TABLET | Freq: Once | ORAL | Status: DC

## 2013-07-23 MED ORDER — SERTRALINE HCL 25 MG PO TABS: 25.0000 mg | ORAL_TABLET | Freq: Every day | ORAL | Status: DC

## 2013-07-23 NOTE — ED Notes (Signed)
Pt c/o palpitations, anxiety and loss of appetite since starting Zoloft Tues and Mirtazapine on Friday.

## 2013-07-23 NOTE — ED Notes (Signed)
Pt resting with family at bedside, sts she is more relaxed now. No needs voiced.

## 2013-07-23 NOTE — ED Provider Notes (Signed)
I saw and evaluated the patient, reviewed the resident's note and I agree with the findings and plan.  Pt is a 67 y.o. female with a history of hypertension, hyperlipidemia, depression and anxiety who presents to the emergency department with complaints of feeling her heart racing, feeling very anxious and upset and loss of appetite over the past 2 weeks. She states her symptoms have gotten worse since there've been changes in her medications. Her psychiatrist has recently started Zoloft on Tuesday, 5 days ago and increased the dose from 25 mg to 50 mg at night 2 days ago. She was also started on mirtazapine 2 days ago to help with her sleeping. She was recently seen in the emergency department for hives that were seen on her neck that was thought due to anxiety. She was started on Atarax but this medication made her nauseous so this was stopped last week. She denies that she's had any chest pain or shortness of breath. No vomiting or diarrhea. She denies any thoughts of wanting to hurt herself or anyone else, hallucinations or delusions. She states that her sister is been staying with her and we'll continue to do so. Patient is able to contract for safety. She states her symptoms are much worse after the death of a friend 2 months ago. On exam, patient is hemodynamically stable with a heart rate in the 60s. Heart and lung sounds are normal. Abdomen soft and nontender. She is tearful and appears anxious on exam but denies any safety concerns. Will obtain basic labs, troponin, urine and EKG to ensure that we are not missing any organic cause for her symptoms today. Suspect her anxiety and depression are secondary to recent loss of her friend. Currently there are no safety concerns and she has family that is staying with her at home. She is able to contract for safety and feels comfortable with the plan to be discharged home if workup is unremarkable. Will give Ativan in the ED and discharged with small  prescription for the same.  Patient does have PCP and psychiatry outpatient followup. Have discussed with patient that she may need to see a therapist as well.   EKG shows T-wave inversions in inferior and lateral leads. There are no old EKGs for comparison. Given patient is not having chest pain or shortness of breath, I feel this is likely old. There is no sign of any arrhythmia on her EKG or interval changes. Troponin is pending.   Patient's labs are unremarkable. Troponin is negative. Urine does show moderate leukocytes and few bacteria. Will treat for UTI with Macrobid. Culture is pending. Have had a lengthy discussion with patient regarding her EKG findings. She states that she has been seen by a cardiologist in the past and was cleared by them and discharge from their practice. Have discussed with patient that she needs to followup with her primary care doctor this week for reevaluation. Have given strict return precautions including chest pain, shortness of breath, dizziness, syncope, diaphoresis. Patient verbalizes understanding. She states she feels much better after Ativan. Have discussed with her that she should hold her Remeron tonight and return back to her dose of 25 mg of Zoloft. We'll have her talk to her psychiatrist tomorrow.  Layla Maw Ward, DO 07/23/13 1231

## 2013-07-23 NOTE — ED Provider Notes (Signed)
CSN: [id number]     Arrival date & time 07/23/13  4098 History   First MD Initiated Contact with Patient 07/23/13 0913     Chief Complaint  Patient presents with  . Palpitations   (Consider location/radiation/quality/duration/timing/severity/associated sxs/prior Treatment) Patient is a 67 y.o. female presenting with palpitations.  Palpitations Associated symptoms: no chest pain, no diaphoresis, no dizziness, no nausea, no numbness and no vomiting     Kristy Hays is a 67 y.o. 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 The patient developed anxiety and panic feelings last night after taking her medications (zoloft 50 mg and remeron). She describes palpitations that have been continuous since then. She denies chest pain. Admits to anorexia and abdominal cramping that has been present a chronic problem since her diagnosis of adjustment disorder.Her psych medications have been managed by her psychiatrist Dr. Laury Deep. He recently increased her zoloft dose to 50 mg, from 25 mg, and added Remeron. She was unable to contact him this morning.   Past Medical History  Diagnosis Date  . Glaucoma   . Hypertension   . Hyperlipidemia   . Anxiety   . Depression    Past Surgical History  Procedure Laterality Date  . Abdominal hysterectomy     Family History  Problem Relation Age of Onset  . Cancer Neg Hx   . Heart attack Mother   . Hypertension Mother   . Hypertension Sister    History  Substance Use Topics  . Smoking status: Former Games developer  . Smokeless tobacco: Not on file     Comment: Quit 24 years ago,  . Alcohol Use: No     Comment: Very seldom-5 times a year   OB History   Grav Para Term Preterm Abortions TAB SAB Ect Mult Living                 Review of Systems  Constitutional: Negative for fever, diaphoresis and fatigue.  Cardiovascular: Positive for palpitations. Negative for chest pain and leg swelling.   Gastrointestinal: Positive for abdominal pain. Negative for nausea, vomiting, diarrhea, constipation, blood in stool and abdominal distention.  Neurological: Negative for dizziness, facial asymmetry, light-headedness, numbness and headaches.  Psychiatric/Behavioral: Positive for behavioral problems and dysphoric mood. Negative for suicidal ideas, hallucinations, confusion, sleep disturbance, self-injury, decreased concentration and agitation. The patient is nervous/anxious. The patient is not hyperactive.     Allergies  Antihistamines, chlorpheniramine-type; Benadryl; and Penicillins  Home Medications   Current Outpatient Rx  Name  Route  Sig  Dispense  Refill  . amLODipine (NORVASC) 10 MG tablet   Oral   Take 5 mg by mouth daily.         Marland Kitchen atorvastatin (LIPITOR) 40 MG tablet   Oral   Take 1 tablet (40 mg total) by mouth daily.   90 tablet   3   . busPIRone (BUSPAR) 10 MG tablet   Oral   Take 1 tablet (10 mg total) by mouth 3 (three) times daily.   90 tablet   1     Please discontinue all previous prescriptions for  ...   . carvedilol (COREG) 25 MG tablet   Oral   Take 1 tablet (25 mg total) by mouth 2 (two) times daily with a meal.   180 tablet   3   . fish oil-omega-3 fatty acids 1000 MG capsule   Oral   Take 1 g by mouth daily.         Marland Kitchen  lisinopril (PRINIVIL,ZESTRIL) 20 MG tablet   Oral   Take 1 tablet (20 mg total) by mouth daily.   90 tablet   3   . lisinopril-hydrochlorothiazide (PRINZIDE,ZESTORETIC) 20-25 MG per tablet   Oral   Take 1 tablet by mouth daily.   90 tablet   3   . mirtazapine (REMERON) 30 MG tablet   Oral   Take 1 tablet (30 mg total) by mouth at bedtime.   30 tablet   0   . sertraline (ZOLOFT) 25 MG tablet   Oral   Take 50 mg by mouth at bedtime. Take one tablet for 14 days with food, then increase to two tablets daily.         . Travoprost, BAK Free, (TRAVATAN) 0.004 % SOLN ophthalmic solution      1 drop at bedtime.           BP 132/74  Pulse 69  Temp(Src) 98.9 F (37.2 C) (Oral)  Resp 20  Ht 5\' 4"  (1.626 m)  Wt 160 lb (72.576 kg)  BMI 27.45 kg/m2  SpO2 96% Physical Exam  Constitutional: She is oriented to person, place, and time. She appears well-developed and well-nourished. No distress.  HENT:  Head: Normocephalic.  Mouth/Throat: Oropharynx is clear and moist. No oropharyngeal exudate.  Eyes: Conjunctivae and EOM are normal. Pupils are equal, round, and reactive to light.  Cardiovascular: Normal rate, regular rhythm, normal heart sounds and intact distal pulses.  Exam reveals no gallop and no friction rub.   No murmur heard. Pulmonary/Chest: Effort normal and breath sounds normal. No respiratory distress. She has no wheezes. She has no rales.  Abdominal: Soft. Bowel sounds are normal. She exhibits no distension and no mass. There is no tenderness. There is no rebound and no guarding.  Musculoskeletal: She exhibits no edema and no tenderness.  Neurological: She is alert and oriented to person, place, and time.  Skin: She is not diaphoretic.  Psychiatric: Judgment and thought content normal.  Anxious, tremulous voice. No pressured speech.    ED Course  Procedures (including critical care time) Labs Review Labs Reviewed  CBC WITH DIFFERENTIAL - Abnormal; Notable for the following:    Neutrophils Relative % 78 (*)    All other components within normal limits  BASIC METABOLIC PANEL - Abnormal; Notable for the following:    Glucose, Bld 130 (*)    GFR calc non Af Amer 57 (*)    GFR calc Af Amer 66 (*)    All other components within normal limits  URINALYSIS, ROUTINE W REFLEX MICROSCOPIC - Abnormal; Notable for the following:    Leukocytes, UA MODERATE (*)    All other components within normal limits  URINE MICROSCOPIC-ADD ON - Abnormal; Notable for the following:    Bacteria, UA FEW (*)    All other components within normal limits  URINE CULTURE  TROPONIN I   Imaging Review No results  found.  EKG Interpretation     Ventricular Rate:  54 PR Interval:  164 QRS Duration: 84 QT Interval:  432 QTC Calculation: 409 R Axis:   17 Text Interpretation:  Sinus bradycardia Cannot rule out Anterior infarct , age undetermined ST \\T \ T wave abnormality, consider inferolateral ischemia Abnormal ECG No old tracing to compare            MDM  No diagnosis found.  1. Anxiety disorder The patients palpitations are likely 2/2 to anxiety disorder. ACS unlikely given EKG and troponin negative. Arrythmia unlikely given  HR of 60 and sinus bradycardia on EKG. Plan to d/c Remeron and decrease dose of Zoloft, and patient with f/u with psychiatrist on Monday. Recommended to patient to f/u with PCP for nonspecific ST wave changes on EKG.  2. UTI Patient with moderate leukocytes and few bacteria. Plan to treat with short course of abx.   Pleas Koch, MD 07/23/13 1254

## 2013-07-24 ENCOUNTER — Telehealth (HOSPITAL_COMMUNITY): Payer: Self-pay

## 2013-07-24 ENCOUNTER — Ambulatory Visit (HOSPITAL_COMMUNITY): Payer: Self-pay | Admitting: Psychiatry

## 2013-07-24 LAB — URINE CULTURE
Colony Count: NO GROWTH
Culture: NO GROWTH

## 2013-07-24 NOTE — Telephone Encounter (Signed)
Called patient.  She reports mirtazapine did not work. Reduced Zoloft to 25 mg. Continue buspirone 10 mg. Continue Lorazepam PRN.  Restart trazodone for sleep 125 mg-150 mg- she reports she has not gone this high on the dose previously.

## 2013-07-25 ENCOUNTER — Telehealth: Payer: Self-pay

## 2013-07-25 ENCOUNTER — Telehealth (HOSPITAL_COMMUNITY): Payer: Self-pay

## 2013-07-25 NOTE — Telephone Encounter (Signed)
Patient called stated that she was prescribed Bactrim on Sunday at the Urgent Care she stated that she is having diarrhea and she is not having any appetite she wants to know if she can take some imodium or what does she need to do to keep from getting dehydrated? Rhonda Cunningham,CMA

## 2013-07-25 NOTE — Telephone Encounter (Signed)
Spoke to patient advised patient that she can take the imodium and if it is not better within a week to follow up. Rhonda Cunningham,CMA

## 2013-07-25 NOTE — Telephone Encounter (Signed)
Patient called, she still feels anxiety, and feels like she needs to take an Ativan.    I have asked her to take an Ativan only if her anxiety is a  9-10/10  (0=no anxiety; 5= moderate/tolerable anxiety; 10= panic attacks).  Will provide refills until sertraline is at a therapeutic dose.

## 2013-07-25 NOTE — Telephone Encounter (Signed)
Imodium will be good, if this is ineffective after a week we can certainly try diphenoxylate/atropine which is a prescription. If this does not help her diarrhea after one week she needs to come in to see me for a stool test considering recent antibiotics.

## 2013-07-27 ENCOUNTER — Ambulatory Visit: Payer: Self-pay | Admitting: Sports Medicine

## 2013-07-27 ENCOUNTER — Telehealth (HOSPITAL_COMMUNITY): Payer: Self-pay

## 2013-07-27 NOTE — Telephone Encounter (Signed)
Pt has questions also needs medication refill of medication

## 2013-07-27 NOTE — Telephone Encounter (Signed)
Called patient. She took two trazodone (50 mg). Will increase sertraline 50 mg daily.

## 2013-07-28 MED ORDER — LORAZEPAM 1 MG PO TABS: 1.0000 mg | ORAL_TABLET | ORAL | Status: DC | PRN

## 2013-07-28 NOTE — Telephone Encounter (Signed)
Called patient. The patient reports that she is doing well with her current dose of trazodone. Will continue Lorazepam 1 mg-one tablet as needed daily.  Will call in prescription. Will attempt to call [facility] for prior authorization for hydroxyzine on 07/17/2013 at ([phone]).

## 2013-07-31 ENCOUNTER — Telehealth (HOSPITAL_COMMUNITY): Payer: Self-pay

## 2013-07-31 NOTE — Telephone Encounter (Signed)
Called patient. She is doing well overall.

## 2013-07-31 NOTE — Telephone Encounter (Signed)
Pt called to say she had a good weekend and would like you to call her

## 2013-08-04 ENCOUNTER — Ambulatory Visit: Payer: Self-pay | Admitting: Sports Medicine

## 2013-08-04 ENCOUNTER — Telehealth (HOSPITAL_COMMUNITY): Payer: Self-pay

## 2013-08-04 NOTE — Telephone Encounter (Addendum)
Patient's daughter provided collateral information.  Called patient. Continues to have anxiety despite taking ativan. Will restart mirtazapine 30 mg for sleep and loss of appetite. Asked her to hold off on trazodone, unless mirtazapine did not work for sleep. Discussed EMDR therapy.

## 2013-08-06 ENCOUNTER — Emergency Department (EMERGENCY_DEPARTMENT_HOSPITAL)
Admission: EM | Admit: 2013-08-06 | Discharge: 2013-08-06 | Disposition: A | Discharge status: Other Acute Inpatient Hospital | Payer: Medicare Other | Source: Home / Self Care | Attending: Emergency Medicine | Admitting: Emergency Medicine

## 2013-08-06 ENCOUNTER — Encounter (HOSPITAL_COMMUNITY): Payer: Self-pay | Admitting: Registered Nurse

## 2013-08-06 ENCOUNTER — Inpatient Hospital Stay (HOSPITAL_COMMUNITY)
Admission: AD | Admit: 2013-08-06 | Discharge: 2013-08-14 | DRG: 885 | Disposition: A | Discharge status: Home / Self Care | Payer: Medicare Other | Attending: Emergency Medicine | Admitting: Emergency Medicine

## 2013-08-06 ENCOUNTER — Encounter (HOSPITAL_COMMUNITY): Payer: Self-pay

## 2013-08-06 DIAGNOSIS — R1110 Vomiting, unspecified: Secondary | ICD-10-CM

## 2013-08-06 DIAGNOSIS — L918 Other hypertrophic disorders of the skin: Secondary | ICD-10-CM

## 2013-08-06 DIAGNOSIS — F329 Major depressive disorder, single episode, unspecified: Secondary | ICD-10-CM | POA: Insufficient documentation

## 2013-08-06 DIAGNOSIS — Z79899 Other long term (current) drug therapy: Secondary | ICD-10-CM | POA: Insufficient documentation

## 2013-08-06 DIAGNOSIS — I10 Essential (primary) hypertension: Secondary | ICD-10-CM | POA: Diagnosis present

## 2013-08-06 DIAGNOSIS — F4323 Adjustment disorder with mixed anxiety and depressed mood: Secondary | ICD-10-CM

## 2013-08-06 DIAGNOSIS — F332 Major depressive disorder, recurrent severe without psychotic features: Secondary | ICD-10-CM

## 2013-08-06 DIAGNOSIS — R200 Anesthesia of skin: Secondary | ICD-10-CM

## 2013-08-06 DIAGNOSIS — E785 Hyperlipidemia, unspecified: Secondary | ICD-10-CM | POA: Insufficient documentation

## 2013-08-06 DIAGNOSIS — Z299 Encounter for prophylactic measures, unspecified: Secondary | ICD-10-CM

## 2013-08-06 DIAGNOSIS — F39 Unspecified mood [affective] disorder: Secondary | ICD-10-CM

## 2013-08-06 DIAGNOSIS — Z87891 Personal history of nicotine dependence: Secondary | ICD-10-CM | POA: Insufficient documentation

## 2013-08-06 DIAGNOSIS — Z0289 Encounter for other administrative examinations: Secondary | ICD-10-CM | POA: Insufficient documentation

## 2013-08-06 DIAGNOSIS — F3130 Bipolar disorder, current episode depressed, mild or moderate severity, unspecified: Secondary | ICD-10-CM

## 2013-08-06 DIAGNOSIS — F1010 Alcohol abuse, uncomplicated: Secondary | ICD-10-CM

## 2013-08-06 DIAGNOSIS — R5381 Other malaise: Secondary | ICD-10-CM | POA: Insufficient documentation

## 2013-08-06 DIAGNOSIS — R197 Diarrhea, unspecified: Secondary | ICD-10-CM | POA: Insufficient documentation

## 2013-08-06 DIAGNOSIS — R45851 Suicidal ideations: Secondary | ICD-10-CM

## 2013-08-06 DIAGNOSIS — F411 Generalized anxiety disorder: Secondary | ICD-10-CM | POA: Insufficient documentation

## 2013-08-06 DIAGNOSIS — H409 Unspecified glaucoma: Secondary | ICD-10-CM | POA: Insufficient documentation

## 2013-08-06 DIAGNOSIS — Z880 Allergy status to penicillin: Secondary | ICD-10-CM | POA: Insufficient documentation

## 2013-08-06 DIAGNOSIS — F3289 Other specified depressive episodes: Secondary | ICD-10-CM | POA: Insufficient documentation

## 2013-08-06 DIAGNOSIS — F1910 Other psychoactive substance abuse, uncomplicated: Secondary | ICD-10-CM

## 2013-08-06 LAB — COMPREHENSIVE METABOLIC PANEL
ALT: 23 U/L (ref 0–35)
Alkaline Phosphatase: 66 U/L (ref 39–117)
CO2: 27 mEq/L (ref 19–32)
Calcium: 9.5 mg/dL (ref 8.4–10.5)
Chloride: 94 mEq/L — ABNORMAL LOW (ref 96–112)
Creatinine, Ser: 1.28 mg/dL — ABNORMAL HIGH (ref 0.50–1.10)
GFR calc Af Amer: 49 mL/min — ABNORMAL LOW (ref >90)
GFR calc non Af Amer: 42 mL/min — ABNORMAL LOW (ref >90)
Glucose, Bld: 118 mg/dL — ABNORMAL HIGH (ref 70–99)
Sodium: 132 mEq/L — ABNORMAL LOW (ref 135–145)
Total Bilirubin: 0.5 mg/dL (ref 0.3–1.2)

## 2013-08-06 LAB — SALICYLATE LEVEL: Salicylate Lvl: <2 mg/dL — ABNORMAL LOW (ref 2.8–20.0)

## 2013-08-06 LAB — CBC
HCT: 37.9 % (ref 36.0–46.0)
Hemoglobin: 12.9 g/dL (ref 12.0–15.0)
MCH: 30.7 pg (ref 26.0–34.0)
MCHC: 34 g/dL (ref 30.0–36.0)
MCV: 90.2 fL (ref 78.0–100.0)
Platelets: 324 10*3/uL (ref 150–400)
RBC: 4.2 MIL/uL (ref 3.87–5.11)
WBC: 7.4 10*3/uL (ref 4.0–10.5)

## 2013-08-06 LAB — RAPID URINE DRUG SCREEN, HOSP PERFORMED
Barbiturates: NOT DETECTED
Benzodiazepines: NOT DETECTED
Opiates: NOT DETECTED
Tetrahydrocannabinol: NOT DETECTED

## 2013-08-06 MED ORDER — CARVEDILOL 25 MG PO TABS
25.0000 mg | ORAL_TABLET | Freq: Two times a day (BID) | ORAL | Status: DC
  Administered 2013-08-07 – 2013-08-14 (×15): 25 mg via ORAL
  Filled 2013-08-06 (×2): qty 1
  Filled 2013-08-06: qty 2
  Filled 2013-08-06 (×19): qty 1

## 2013-08-06 MED ORDER — HYDROCHLOROTHIAZIDE 25 MG PO TABS
25.0000 mg | ORAL_TABLET | Freq: Every day | ORAL | Status: DC
  Administered 2013-08-07 – 2013-08-14 (×8): 25 mg via ORAL
  Filled 2013-08-06 (×11): qty 1

## 2013-08-06 MED ORDER — ADULT MULTIVITAMIN W/MINERALS CH
1.0000 | ORAL_TABLET | Freq: Every day | ORAL | Status: DC
  Administered 2013-08-06: 1 via ORAL
  Filled 2013-08-06: qty 1

## 2013-08-06 MED ORDER — TRAVOPROST (BAK FREE) 0.004 % OP SOLN
1.0000 [drp] | Freq: Every day | OPHTHALMIC | Status: DC
  Administered 2013-08-06 – 2013-08-11 (×5): 1 [drp] via OPHTHALMIC

## 2013-08-06 MED ORDER — HYDROCHLOROTHIAZIDE 25 MG PO TABS: 25.0000 mg | ORAL_TABLET | Freq: Every day | ORAL | Status: DC

## 2013-08-06 MED ORDER — LISINOPRIL-HYDROCHLOROTHIAZIDE 20-25 MG PO TABS: 1.0000 | ORAL_TABLET | Freq: Every day | ORAL | Status: DC

## 2013-08-06 MED ORDER — BUSPIRONE HCL 10 MG PO TABS
10.0000 mg | ORAL_TABLET | Freq: Three times a day (TID) | ORAL | Status: DC
  Administered 2013-08-07 – 2013-08-09 (×7): 10 mg via ORAL
  Filled 2013-08-06 (×16): qty 1

## 2013-08-06 MED ORDER — ALUM & MAG HYDROXIDE-SIMETH 200-200-20 MG/5ML PO SUSP
30.0000 mL | ORAL | Status: DC | PRN
  Administered 2013-08-07 (×3): 30 mL via ORAL

## 2013-08-06 MED ORDER — MIRTAZAPINE 30 MG PO TABS
30.0000 mg | ORAL_TABLET | Freq: Every day | ORAL | Status: DC
  Filled 2013-08-06: qty 1
  Filled 2013-08-06: qty 2
  Filled 2013-08-06: qty 1

## 2013-08-06 MED ORDER — MIRTAZAPINE 30 MG PO TABS: 30.0000 mg | ORAL_TABLET | Freq: Every day | ORAL | Status: DC

## 2013-08-06 MED ORDER — LISINOPRIL 20 MG PO TABS
20.0000 mg | ORAL_TABLET | Freq: Every day | ORAL | Status: DC
  Filled 2013-08-06: qty 1

## 2013-08-06 MED ORDER — LISINOPRIL 20 MG PO TABS
20.0000 mg | ORAL_TABLET | Freq: Every day | ORAL | Status: DC
  Administered 2013-08-07 – 2013-08-14 (×8): 20 mg via ORAL
  Filled 2013-08-06 (×11): qty 1

## 2013-08-06 MED ORDER — SERTRALINE HCL 50 MG PO TABS: 50.0000 mg | ORAL_TABLET | Freq: Every day | ORAL | Status: DC

## 2013-08-06 MED ORDER — AMLODIPINE BESYLATE 5 MG PO TABS
5.0000 mg | ORAL_TABLET | Freq: Every day | ORAL | Status: DC
  Administered 2013-08-07 – 2013-08-14 (×8): 5 mg via ORAL
  Filled 2013-08-06 (×11): qty 1

## 2013-08-06 MED ORDER — LORAZEPAM 1 MG PO TABS
1.0000 mg | ORAL_TABLET | Freq: Three times a day (TID) | ORAL | Status: DC | PRN
  Administered 2013-08-07 – 2013-08-11 (×7): 1 mg via ORAL
  Filled 2013-08-06 (×7): qty 1

## 2013-08-06 MED ORDER — LISINOPRIL 20 MG PO TABS: 20.0000 mg | ORAL_TABLET | Freq: Every day | ORAL | Status: DC

## 2013-08-06 MED ORDER — LORAZEPAM 1 MG PO TABS
1.0000 mg | ORAL_TABLET | Freq: Three times a day (TID) | ORAL | Status: DC | PRN
  Administered 2013-08-06: 1 mg via ORAL
  Filled 2013-08-06: qty 1

## 2013-08-06 MED ORDER — AMLODIPINE BESYLATE 5 MG PO TABS: 5.0000 mg | ORAL_TABLET | Freq: Every day | ORAL | Status: DC

## 2013-08-06 MED ORDER — OMEGA-3-ACID ETHYL ESTERS 1 G PO CAPS
1.0000 g | ORAL_CAPSULE | Freq: Every day | ORAL | Status: DC
  Administered 2013-08-07 – 2013-08-14 (×8): 1 g via ORAL
  Filled 2013-08-06 (×11): qty 1

## 2013-08-06 MED ORDER — CARVEDILOL 25 MG PO TABS
25.0000 mg | ORAL_TABLET | Freq: Two times a day (BID) | ORAL | Status: DC
  Administered 2013-08-06: 25 mg via ORAL
  Filled 2013-08-06 (×2): qty 1

## 2013-08-06 MED ORDER — LORATADINE 10 MG PO TABS
10.0000 mg | ORAL_TABLET | Freq: Every day | ORAL | Status: DC
  Administered 2013-08-07 – 2013-08-14 (×8): 10 mg via ORAL
  Filled 2013-08-06 (×11): qty 1

## 2013-08-06 MED ORDER — LORATADINE 10 MG PO TABS: 10.0000 mg | ORAL_TABLET | Freq: Every day | ORAL | Status: DC

## 2013-08-06 MED ORDER — ATORVASTATIN CALCIUM 40 MG PO TABS
40.0000 mg | ORAL_TABLET | Freq: Every evening | ORAL | Status: DC
  Filled 2013-08-06: qty 1

## 2013-08-06 MED ORDER — OMEGA-3-ACID ETHYL ESTERS 1 G PO CAPS: 1.0000 g | ORAL_CAPSULE | Freq: Every day | ORAL | Status: DC

## 2013-08-06 MED ORDER — TRAVOPROST (BAK FREE) 0.004 % OP SOLN: 1.0000 [drp] | Freq: Every day | OPHTHALMIC | Status: DC

## 2013-08-06 MED ORDER — MAGNESIUM HYDROXIDE 400 MG/5ML PO SUSP: 30.0000 mL | Freq: Every day | ORAL | Status: DC | PRN

## 2013-08-06 MED ORDER — SERTRALINE HCL 50 MG PO TABS
50.0000 mg | ORAL_TABLET | Freq: Every day | ORAL | Status: DC
  Administered 2013-08-06: 50 mg via ORAL
  Filled 2013-08-06 (×3): qty 1

## 2013-08-06 MED ORDER — TRAZODONE HCL 100 MG PO TABS
100.0000 mg | ORAL_TABLET | Freq: Every day | ORAL | Status: DC
  Administered 2013-08-06 – 2013-08-13 (×7): 100 mg via ORAL
  Filled 2013-08-06 (×12): qty 1

## 2013-08-06 MED ORDER — ACETAMINOPHEN 325 MG PO TABS
650.0000 mg | ORAL_TABLET | Freq: Four times a day (QID) | ORAL | Status: DC | PRN
  Administered 2013-08-07: 650 mg via ORAL
  Filled 2013-08-06: qty 2

## 2013-08-06 MED ORDER — ADULT MULTIVITAMIN W/MINERALS CH
1.0000 | ORAL_TABLET | Freq: Every day | ORAL | Status: DC
  Administered 2013-08-07 – 2013-08-14 (×8): 1 via ORAL
  Filled 2013-08-06 (×11): qty 1

## 2013-08-06 MED ORDER — ATORVASTATIN CALCIUM 40 MG PO TABS
40.0000 mg | ORAL_TABLET | Freq: Every evening | ORAL | Status: DC
  Administered 2013-08-07 – 2013-08-13 (×7): 40 mg via ORAL
  Filled 2013-08-06 (×10): qty 1

## 2013-08-06 MED ORDER — OMEGA-3 FATTY ACIDS 1000 MG PO CAPS: 1.0000 g | ORAL_CAPSULE | Freq: Every day | ORAL | Status: DC

## 2013-08-06 MED ORDER — TRAZODONE HCL 100 MG PO TABS: 100.0000 mg | ORAL_TABLET | Freq: Every day | ORAL | Status: DC

## 2013-08-06 MED ORDER — BUSPIRONE HCL 10 MG PO TABS
10.0000 mg | ORAL_TABLET | Freq: Three times a day (TID) | ORAL | Status: DC
  Administered 2013-08-06: 10 mg via ORAL
  Filled 2013-08-06: qty 1

## 2013-08-06 NOTE — ED Notes (Signed)
All belongings with family.

## 2013-08-06 NOTE — ED Notes (Signed)
Report given to behavioral health unit RN.

## 2013-08-06 NOTE — Consult Note (Signed)
  Subjective:   Patient states that she has been receiving outpatient services in [facility] for 3 months.  "I been going to see physiatrist for 3 months and my medication is not working.  I was started on Zoloft; when I went back in 3 weeks it wasn't working well I went to the emergency room and the doctor put me on another medicine.  When I went back to see Dr. Laury Deep he put me back on the Zoloft.  I been back on it for 2 weeks now.  I think the medicine is making me feel worse instead of better.  I have to appetite and think I may have lost some weight but not sure.  I can't get out of bed; I don't feel like doing anything and that is not me.  I have started to cry.  I know I need some help or something to get me to feeling like my self again."  Patient states that she lives alone and is able to perform her ADL's "it's just I don't feel like doing anything."  Patient states that she was on a medication 32 years ago for depression and it "worked really well but I don't remember what the name of it was.  Patient states that she also has difficulty sleeping unless she takes the medication that was prescribed.  Patient states that she is also taking Ativan which helps some with the anxiety.  "The vistaril did nothing to help."  Patient denies suicidal/homicidal ideation at this time.  "I just need to feel like my self; I just feel that I am waisting away; I just don't know what else to do."  Patient denies psychosis and paranoia.   Axis I: Alcohol Abuse, Bipolar, Depressed, Mood Disorder NOS and Substance Abuse   Psychiatric Specialty Exam: Physical Exam  ROS  Blood pressure 155/93, pulse 62, temperature 97.6 F (36.4 C), temperature source Oral, resp. rate 18, SpO2 96.00%.There is no weight on file to calculate BMI.  General Appearance: Casual  Eye Contact::  Good  Speech:  Clear and Coherent and Normal Rate  Volume:  Normal  Mood:  Anxious, Depressed and Hopeless  Affect:  Depressed, Flat  and Tearful  Thought Process:  Circumstantial, Disorganized and Goal Directed  Orientation:  Full (Time, Place, and Person)  Thought Content:  "I just want to feel like myself again"  Suicidal Thoughts:  No  Homicidal Thoughts:  No  Memory:  Immediate;   Good Recent;   Good Remote;   Fair  Judgement:  Fair  Insight:  Lacking  Psychomotor Activity:  Normal  Concentration:  Fair  Recall:  Fair  Akathisia:  No  Handed:  Right  AIMS (if indicated):     Assets:  Communication Skills Desire for Improvement Housing Social Support Transportation  Sleep:      Face to face interview/consult and then consulted Dr. Lolly Mustache  Disposition:  Inpatient treatment recommended.  Patient accepted to [facility] 506/01.  Monitor for safety and stabilization until patient is transferred to [facility]  Shuvon B. Rankin FNP-BC Family Nurse Practitioner, Board Certified  I agreed with the findings, treatment and disposition plan of this patient. Kathryne Sharper, MD

## 2013-08-06 NOTE — ED Notes (Signed)
Patient family took her belongings to the car

## 2013-08-06 NOTE — Progress Notes (Signed)
Patient ID: Kristy Hays, female   DOB: 08-Jul-1946, 67 y.o.   MRN: [id number] Admission Note:  D:67 yr female who presents VC in no acute distress for the treatment of SI, Depression and medication management. Pt appears flat and depressed. Pt was calm and cooperative with admission process. 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 Pt states she has had medication adjustments  Starting back 8 weeks, but recently the depression and anxiety have gotten too much for her to handle. "i can't even drive now, and it's hard for me to read because I can't concentrate." Pt complains of not having an appetite.    A:Skin was assessed(by female nurse) and found to be clear of any abnormal marks . POC and unit policies explained and understanding verbalized. Consents obtained. Food and fluids offered, and fluids accepted.  R: Pt had no additional questions or concerns.

## 2013-08-06 NOTE — ED Notes (Addendum)
[facility] notified of transport needed to [facility].

## 2013-08-06 NOTE — ED Provider Notes (Signed)
CSN: [id number]     Arrival date & time 08/06/13  1130 History  This chart was scribed for non-physician practitioner Raymon Mutton, PA-C, working with Glynn Octave, MD, by Yevette Edwards, ED Scribe. This patient was seen in room WTR4/WLPT4 and the patient's care was started at 12:29 PM.   First MD Initiated Contact with Patient 08/06/13 1152     Chief Complaint  Patient presents with  . Medical Clearance   The history is provided by the patient. No language interpreter was used.   HPI Comments: Kristy Hays is a 67 y.o. female, with a h/o anxiety and depression, who presents to the Emergency Department for medical clearance. The pt reports she has experienced depression for 3 months which has interfered with her daily activities of life including eating, sleeping, showering, and engaging in normal activities. She reports that a friend with whom she conversed daily recently died of cancer, and she credits that experience with the onset of this episode of depression. The pt has has been treated by a psychiatrist in [place] via phone conferences intermittently for three months. She reports the medication prescribed for depression has not improved her symptoms; she states she has experienced worsening symptoms in the past three weeks.  She has experienced a diminished appetite, diarrhea upon eating, generalized weakness, and sleep disturbances. The pt uses medication for sleeping; she states that she cannot sleep well without the medication. The pt denies any suicidal thoughts or thoughts of self-injury, homicidal intentions, hallucinations. She denies experiencing any pain including chest pain or abdominal pain as well as SOB. She also denies using alcohol or drugs. The pt also has a h/o HTN and hyperlipidemia. She is a former smoker. The pt lives alone. Denied alcohol use, illicit drug use.  Dr. Tiburcio Pea treats the pt for glaucoma. Her last appointment was two months ago, and she had good  pressure to her eyes.  Past Medical History  Diagnosis Date  . Glaucoma   . Hypertension   . Hyperlipidemia   . Anxiety   . Depression    Past Surgical History  Procedure Laterality Date  . Abdominal hysterectomy     Family History  Problem Relation Age of Onset  . Cancer Neg Hx   . Heart attack Mother   . Hypertension Mother   . Hypertension Sister    History  Substance Use Topics  . Smoking status: Former Games developer  . Smokeless tobacco: Not on file     Comment: Quit 24 years ago,  . Alcohol Use: No     Comment: Very seldom-5 times a year   No OB history provided.  Review of Systems  Constitutional: Negative for fever and chills.  Respiratory: Negative for shortness of breath.   Cardiovascular: Negative for chest pain.  Gastrointestinal: Positive for diarrhea. Negative for abdominal pain.  Musculoskeletal: Negative for back pain.  Neurological: Positive for weakness.  Psychiatric/Behavioral: Positive for sleep disturbance. Negative for suicidal ideas, hallucinations and self-injury.  All other systems reviewed and are negative.    Allergies  Antihistamines, chlorpheniramine-type; Benadryl; and Penicillins  Home Medications   Current Outpatient Rx  Name  Route  Sig  Dispense  Refill  . amLODipine (NORVASC) 10 MG tablet   Oral   Take 5 mg by mouth daily.         Marland Kitchen atorvastatin (LIPITOR) 40 MG tablet   Oral   Take 40 mg by mouth daily.         . busPIRone (BUSPAR) 10 MG  tablet   Oral   Take 10 mg by mouth 3 (three) times daily.         . carvedilol (COREG) 25 MG tablet   Oral   Take 25 mg by mouth 2 (two) times daily with a meal.         . fish oil-omega-3 fatty acids 1000 MG capsule   Oral   Take 1 g by mouth daily.         Marland Kitchen lisinopril (PRINIVIL,ZESTRIL) 20 MG tablet   Oral   Take 20 mg by mouth daily.         Marland Kitchen lisinopril-hydrochlorothiazide (PRINZIDE,ZESTORETIC) 20-25 MG per tablet   Oral   Take 1 tablet by mouth daily.          Marland Kitchen loratadine (CLARITIN) 10 MG tablet   Oral   Take 10 mg by mouth daily.         Marland Kitchen LORazepam (ATIVAN) 1 MG tablet   Oral   Take 1 mg by mouth every 8 (eight) hours as needed for anxiety.         . mirtazapine (REMERON) 30 MG tablet   Oral   Take 30 mg by mouth at bedtime.         . Multiple Vitamin (MULTIVITAMIN WITH MINERALS) TABS tablet   Oral   Take 1 tablet by mouth daily.         . sertraline (ZOLOFT) 25 MG tablet   Oral   Take 50 mg by mouth at bedtime.         . Travoprost, BAK Free, (TRAVATAN) 0.004 % SOLN ophthalmic solution   Both Eyes   Place 1 drop into both eyes at bedtime.          . traZODone (DESYREL) 50 MG tablet   Oral   Take 100 mg by mouth at bedtime.           Triage Vitals: BP 119/73  Pulse 67  Temp(Src) 97.6 F (36.4 C) (Oral)  Resp 18  SpO2 97%  Physical Exam  Nursing note and vitals reviewed. Constitutional: She is oriented to person, place, and time. She appears well-developed and well-nourished. No distress.  HENT:  Head: Normocephalic and atraumatic.  Mouth/Throat: Oropharynx is clear and moist. No oropharyngeal exudate.  Eyes: Conjunctivae and EOM are normal. Pupils are equal, round, and reactive to light. Right eye exhibits no discharge. Left eye exhibits no discharge.  Neck: Normal range of motion. Neck supple. No tracheal deviation present.  Cardiovascular: Normal rate, regular rhythm and normal heart sounds.  Exam reveals no friction rub.   No murmur heard. Pulses:      Radial pulses are 2+ on the right side, and 2+ on the left side.       Dorsalis pedis pulses are 2+ on the right side, and 2+ on the left side.  Pulmonary/Chest: Effort normal and breath sounds normal. No respiratory distress. She has no wheezes. She has no rales.  Musculoskeletal: Normal range of motion.  Full ROM to upper and lower extremities bilaterally without difficulty noted  Neurological: She is alert and oriented to person, place, and time.  No cranial nerve deficit. She exhibits normal muscle tone. Coordination normal.  Skin: Skin is warm and dry. No rash noted. No erythema.  Negative signs of self-inflicted injury  Psychiatric: Her behavior is normal.  Interactive Appears goal-oriented Makes eye contact    ED Course  Procedures (including critical care time)  DIAGNOSTIC STUDIES: Oxygen Saturation is  97% on room air, normal by my interpretation.    COORDINATION OF CARE:  12:39 PM- Discussed treatment plan with patient, and the patient agreed to the plan.   1:07 PM This provider spoke with pharmacy regarding patient's medication for her glaucoma, travoprost-patient reports that she has her medication with her. Pharmacy reported that put an order in for the patient.  1:50 PM Phone call received, as per TTS consults recommended that patient be placed in geriatric psychiatry. Currently looking for placement for patient.  Labs Review Labs Reviewed  COMPREHENSIVE METABOLIC PANEL - Abnormal; Notable for the following:    Sodium 132 (*)    Chloride 94 (*)    Glucose, Bld 118 (*)    Creatinine, Ser 1.28 (*)    GFR calc non Af Amer 42 (*)    GFR calc Af Amer 49 (*)    All other components within normal limits  SALICYLATE LEVEL - Abnormal; Notable for the following:    Salicylate Lvl <2.0 (*)    All other components within normal limits  ACETAMINOPHEN LEVEL  CBC  ETHANOL  URINE RAPID DRUG SCREEN (HOSP PERFORMED)   Imaging Review No results found.  EKG Interpretation   None       MDM  No diagnosis found.  Filed Vitals:   08/06/13 1304 08/06/13 1343  BP: 119/73 155/93  Pulse: 67 62  Temp: 97.6 F (36.4 C) 97.6 F (36.4 C)  TempSrc: Oral Oral  Resp: 18 18  SpO2: 97% 96%    I personally performed the services described in this documentation, which was scribed in my presence. The recorded information has been reviewed and is accurate.  Patient presenting to emergency department with depression has been  ongoing for the past 3 months with worsening over the past 3 weeks. Patient denies suicidal ideation, homicidal ideation, hallucinations, thoughts of hurting herself. Alert and oriented. Appears of oriented. Heart rate and rhythm normal. Lungs clear to auscultation bilaterally. Pulses palpable and strong, radial and DP bilaterally. Full range of motion to upper and lower extremities bilaterally. Urine drug screen negative findings. Acetaminophen negative elevation. CBC negative findings. CMP noted mild hyponatremia of 132-negative DKA. Alcohol level negative elevation. Salicylate level negative elevation. Patient presenting with depression, negative suicidal ideation. Patient medically cleared. Patient placed and psych ED. Psych holding orders placed. Home medications reordered. Patient has tavanoprost eye drops for glaucoma with her.    Raymon Mutton, PA-C 08/07/13 1526

## 2013-08-06 NOTE — Progress Notes (Signed)
Pt accepted to [facility]. [address], Rm 506-1, Dr. Shela Commons. Pt signed voluntary tx form and consent for information form. CSW handed forms off to RN.   Kristy Hays, [phone]     ED CSW

## 2013-08-06 NOTE — Progress Notes (Signed)
CSW met pt and pt's daughter to introduce herself, conduct CSW assessment, and answer any questions they have thus far.  Pt states she feels she needs inpatient treatment because her depression and anxiety are severe. Pt endorsed symptoms of depression, including loss of appetite, decreased sleep, decreased energy, decreased attention to hygiene and appearance, increased agitation, and protracted grief. Pt lives alone. Pt has psychiatrist, does not have other mental health providers/treatments. Pt's daughter present for assessment and is supportive and a strong advocate for pt.   Pt asked several questions about inpatient tx. CSW provided an overview of inpatient tx. CSW provided overview of other tx options. Pt and daughter stated they strongly wanted inpatient treatment, and did not want to have to make another ED visit to procure inpatient.  Pt and daughter thanked CSW for her time. Both appeared anxious, pt quite anxious. Daughter appeared exhausted and somewhat frustrated with long day of being in hospital.    York Spaniel Mossyrock, [phone]     ED CSW  3:39pm

## 2013-08-06 NOTE — Tx Team (Signed)
Initial Interdisciplinary Treatment Plan  PATIENT STRENGTHS: (choose at least two) General fund of knowledge Supportive family/friends  PATIENT STRESSORS: Medication change or noncompliance   PROBLEM LIST: Problem List/Patient Goals Date to be addressed Date deferred Reason deferred Estimated date of resolution  Depression 08/06/13     Risk for Suicide 08/06/13     Anxiety 08/06/13                                          DISCHARGE CRITERIA:  Improved stabilization in mood, thinking, and/or behavior Medical problems require only outpatient monitoring  PRELIMINARY DISCHARGE PLAN: Attend PHP/IOP Outpatient therapy  PATIENT/FAMIILY INVOLVEMENT: This treatment plan has been presented to and reviewed with the patient, Kristy Hays.  The patient and family have been given the opportunity to ask questions and make suggestions.  Jacques Navy A 08/06/2013, 7:54 PM

## 2013-08-06 NOTE — BH Assessment (Signed)
Patient accepted to [facility] by Assunta Found, NP. Attending MD Jonnalagadda, bed assigned 506-1.

## 2013-08-06 NOTE — BH Assessment (Signed)
Assessment Note  Kristy Hays is an 67 y.o. female that was assessed this day after presenting to [facility] as a walk-in.  Pt also had daughter present during assessment per her request.  Pt reports she is a pt of Dr. Darl Householder and that the medication he has prescribed has not been working (Zoloft).  Pt stated she has been severely depressed and unable to function.  Pt has been unable to eat, reporting she has no appetite or gets nauseous, reports poor sleep, and stays in the bed for most of the day.  Pt doesn't bathe or take care of herself by report.  Daughter reports concern as well, reporting this is not her mother's normal disposition.  She reports pt is usually optimistic, active, and goes to the gym.  Pt very tearful, stating she want sto get better and feels she needs inpatient treatment.  Pt denies SI or hx of SI or attempts, denies HI, denies psychosis and denies SA.  Pt stated she lost a friend to cancer and lost her neighbor over the last year and that these have been stressors for ehr.  Pt's only treatment is current treatment with Dr. Laury Deep.  She reported she takes Zoloft, Vistaril and Ativan prescribed in the ED when she presented there 2 weeks ago for anxiety.  Pt endorses sx of anxiety and severe depression.  Inpatient treatment recommended by this clinician.  Consulted with Shelda Jakes, PA-C, who recommended geriactric placement for pt and pt also prefers this.  If beds available, she would prefer [place] or [employer], as pt's daughter works in [employer] and can be available if needed.  Pt transported to [facility] for med clearance.  [facility] called and pt to have a MHT accompany her over to [facility].  Charge nurse, Donnita Falls, notified at [facility] as well as Berneice Heinrich, [facility], and Scherrie Merritts, therapist at [facility].    Axis I: 296.34 Major Depressive Disorder, Recurrent, Severe Without Psychotic Features Axis II: Deferred Axis III:  Past Medical History  Diagnosis Date  . Glaucoma   .  Hypertension   . Hyperlipidemia   . Anxiety   . Depression    Axis IV: other psychosocial or environmental problems Axis V: 21-30 behavior considerably influenced by delusions or hallucinations OR serious impairment in judgment, communication OR inability to function in almost all areas  Past Medical History:  Past Medical History  Diagnosis Date  . Glaucoma   . Hypertension   . Hyperlipidemia   . Anxiety   . Depression     Past Surgical History  Procedure Laterality Date  . Abdominal hysterectomy      Family History:  Family History  Problem Relation Age of Onset  . Cancer Neg Hx   . Heart attack Mother   . Hypertension Mother   . Hypertension Sister     Social History:  reports that she has quit smoking. She does not have any smokeless tobacco history on file. She reports that she does not drink alcohol or use illicit drugs.  Additional Social History:  Alcohol / Drug Use Pain Medications: see MAR Prescriptions: see MAR Over the Counter: see MAR History of alcohol / drug use?: No history of alcohol / drug abuse Longest period of sobriety (when/how long):  (na) Negative Consequences of Use:  (na) Withdrawal Symptoms:  (na)  CIWA:   COWS:    Allergies:  Allergies  Allergen Reactions  . Antihistamines, Chlorpheniramine-Type Other (See Comments)    Makes pt Hyper  .  Benadryl [Diphenhydramine Hcl (Sleep)]     Makes her hyper  . Penicillins     Home Medications:  (Not in a hospital admission)  OB/GYN Status:  No LMP recorded. Patient has had a hysterectomy.  General Assessment Data Location of Assessment: [facility] Is this a Tele or Face-to-Face Assessment?: Face-to-Face Is this an Initial Assessment or a Re-assessment for this encounter?: Initial Assessment Living Arrangements: Alone Can pt return to current living arrangement?: Yes Admission Status: Voluntary Is patient capable of signing voluntary admission?: Yes Transfer from:  Home Referral Source: Self/Family/Friend  Medical Screening Exam [facility] Walk-in ONLY) Medical Exam completed: No Reason for MSE not completed: Other: (pt being sent to [facility] for med clearance)  [facility] Crisis Care Plan Living Arrangements: Alone Name of Psychiatrist: Dr. Laury Deep Name of Therapist: none  Education Status Is patient currently in school?: No  Risk to self Suicidal Ideation: No Suicidal Intent: No Is patient at risk for suicide?: No Suicidal Plan?: No Access to Means: No What has been your use of drugs/alcohol within the last 12 months?: pt denies Previous Attempts/Gestures: No How many times?: 0 Other Self Harm Risks: pt denies Triggers for Past Attempts: None known Intentional Self Injurious Behavior: None Family Suicide History: No Recent stressful life event(s): Loss (Comment);Turmoil (Comment) (Depression, loss of friends) Persecutory voices/beliefs?: No Depression: Yes Depression Symptoms: Despondent;Insomnia;Tearfulness;Isolating;Fatigue;Loss of interest in usual pleasures;Feeling worthless/self pity Substance abuse history and/or treatment for substance abuse?: No Suicide prevention information given to non-admitted patients: Not applicable  Risk to Others Homicidal Ideation: No Thoughts of Harm to Others: No Current Homicidal Intent: No Current Homicidal Plan: No Access to Homicidal Means: No Identified Victim: pt denies History of harm to others?: No Assessment of Violence: None Noted Violent Behavior Description: na - pt calm, cooperative Does patient have access to weapons?: No Criminal Charges Pending?: No Does patient have a court date: No  Psychosis Hallucinations: None noted Delusions: None noted  Mental Status Report Appear/Hygiene: Other (Comment) (casual in street clothes) Eye Contact: Good Motor Activity: Freedom of movement;Unremarkable Speech: Logical/coherent Level of Consciousness: Alert Mood: Depressed Affect:  Depressed;Appropriate to circumstance Anxiety Level: None Thought Processes: Coherent;Relevant Judgement: Unimpaired Orientation: Person;Place;Time;Situation;Appropriate for developmental age Obsessive Compulsive Thoughts/Behaviors: None  Cognitive Functioning Concentration: Decreased Memory: Recent Intact;Remote Intact IQ: Average Insight: Poor Impulse Control: Poor Appetite: Poor Weight Loss:  (pt unsure of how much she has lost) Weight Gain: 0 Sleep: Decreased Total Hours of Sleep:  (varies) Vegetative Symptoms: Staying in bed;Not bathing;Decreased grooming  ADLScreening [facility] Assessment Services) Patient's cognitive ability adequate to safely complete daily activities?: Yes Patient able to express need for assistance with ADLs?: Yes Independently performs ADLs?: Yes (appropriate for developmental age)  Prior Inpatient Therapy Prior Inpatient Therapy: No Prior Therapy Dates: na Prior Therapy Facilty/Provider(s): na Reason for Treatment: na  Prior Outpatient Therapy Prior Outpatient Therapy: Yes Prior Therapy Dates: Current Prior Therapy Facilty/Provider(s): Dr. Laury Deep Reason for Treatment: Depression  ADL Screening (condition at time of admission) Patient's cognitive ability adequate to safely complete daily activities?: Yes Is the patient deaf or have difficulty hearing?: No Does the patient have difficulty seeing, even when wearing glasses/contacts?: No Does the patient have difficulty concentrating, remembering, or making decisions?: No Patient able to express need for assistance with ADLs?: Yes Does the patient have difficulty dressing or bathing?: No Independently performs ADLs?: Yes (appropriate for developmental age) Does the patient have difficulty walking or climbing stairs?: No  Home Assistive Devices/Equipment Home Assistive Devices/Equipment: None  Abuse/Neglect Assessment (Assessment to be complete while patient is alone) Physical Abuse:  Denies Verbal Abuse: Denies Sexual Abuse: Denies Exploitation of patient/patient's resources: Denies Self-Neglect: Denies Values / Beliefs Cultural Requests During Hospitalization: None Spiritual Requests During Hospitalization: None Consults Spiritual Care Consult Needed: No Social Work Consult Needed: No Merchant navy officer (For Healthcare) Advance Directive: Patient does not have advance directive;Patient would not like information    Additional Information 1:1 In Past 12 Months?: No CIRT Risk: No Elopement Risk: No Does patient have medical clearance?: No     Disposition:  Disposition Initial Assessment Completed for this Encounter: Yes Disposition of Patient: Referred to;Inpatient treatment program Type of inpatient treatment program: Adult Patient referred to: Other (Comment) (Pt sent to [facility] for med clearance)  On Site Evaluation by:   Reviewed with Physician:    Caryl Comes 08/06/2013 11:12 AM

## 2013-08-06 NOTE — ED Notes (Addendum)
Pt reports depression x2-3 months, denies SI, no thoughts of harming herself. Family reports pt just is not functioning at home, pt doesn't get out of bed, eat, shower, etc.   Pt reports she had a friend recently dying of cancer that she talked to every day, friend died. Pt denies pain. Pt denies ETOH or drug use. [facility] sent pt over and reported she needed geripsych placement.

## 2013-08-06 NOTE — ED Notes (Signed)
States her friend from work died from cancer on 06-15-2013.

## 2013-08-06 NOTE — ED Notes (Signed)
Fall risk bracelet placed on right arm. 

## 2013-08-06 NOTE — ED Notes (Signed)
[facility] here to transport to [facility]. Duffel bag given to [employer] driver. Patient's eye drops put in manilla envelope. Ambulatory without difficulty. No complaints voiced.

## 2013-08-06 NOTE — ED Notes (Signed)
Social worker at bedside.

## 2013-08-07 ENCOUNTER — Encounter (HOSPITAL_COMMUNITY): Payer: Self-pay | Admitting: Emergency Medicine

## 2013-08-07 DIAGNOSIS — F332 Major depressive disorder, recurrent severe without psychotic features: Principal | ICD-10-CM

## 2013-08-07 DIAGNOSIS — F411 Generalized anxiety disorder: Secondary | ICD-10-CM

## 2013-08-07 MED ORDER — PANTOPRAZOLE SODIUM 40 MG IV SOLR
40.0000 mg | Freq: Once | INTRAVENOUS | Status: AC
  Administered 2013-08-08: 40 mg via INTRAVENOUS
  Filled 2013-08-07: qty 40

## 2013-08-07 MED ORDER — MIRTAZAPINE 15 MG PO TABS
15.0000 mg | ORAL_TABLET | Freq: Every day | ORAL | Status: DC
  Administered 2013-08-08 – 2013-08-10 (×3): 15 mg via ORAL
  Filled 2013-08-07 (×7): qty 1

## 2013-08-07 MED ORDER — ARIPIPRAZOLE 2 MG PO TABS
2.0000 mg | ORAL_TABLET | Freq: Two times a day (BID) | ORAL | Status: DC
  Administered 2013-08-07 – 2013-08-14 (×15): 2 mg via ORAL
  Filled 2013-08-07 (×22): qty 1

## 2013-08-07 MED ORDER — SODIUM CHLORIDE 0.9 % IV SOLN
Freq: Once | INTRAVENOUS | Status: AC
  Administered 2013-08-08: 01:00:00 via INTRAVENOUS

## 2013-08-07 MED ORDER — PAROXETINE HCL ER 12.5 MG PO TB24
12.5000 mg | ORAL_TABLET | Freq: Every day | ORAL | Status: DC
  Administered 2013-08-07 – 2013-08-11 (×5): 12.5 mg via ORAL
  Filled 2013-08-07 (×8): qty 1

## 2013-08-07 NOTE — Tx Team (Signed)
Interdisciplinary Treatment Plan Update   Date Reviewed:  08/07/2013  Time Reviewed:  9:44 AM  Progress in Treatment:   Attending groups: Yes Participating in groups: Yes Taking medication as prescribed: Yes  Tolerating medication: Yes Family/Significant other contact made: Collateral Patient understands diagnosis: Yes  Discussing patient identified problems/goals with staff: Yes Medical problems stabilized or resolved: Yes Denies suicidal/homicidal ideation: Yes Patient has not harmed self or others: Yes  For review of initial/current patient goals, please see plan of care.  Estimated Length of Stay:  2-4 days  Reasons for Continued Hospitalization:  Anxiety Depression Medication stabilization  New Problems/Goals identified:    Discharge Plan or Barriers:   Home with outpatient follow up [facility]  Additional Comments:   Kristy Hays is an 67 y.o. female that was assessed this day after presenting to [facility] as a walk-in. Pt also had daughter present during assessment per her request. Pt reports she is a pt of Dr. Darl Householder and that the medication he has prescribed has not been working (Zoloft). Pt stated she has been severely depressed and unable to function. Pt has been unable to eat, reporting she has no appetite or gets nauseous, reports poor sleep, and stays in the bed for most of the day. Pt doesn't bathe or take care of herself by report. Daughter reports concern as well, reporting this is not her mother's normal disposition. She reports pt is usually optimistic, active, and goes to the gym. Pt very tearful, stating she want sto get better and feels she needs inpatient treatment. Pt denies SI or hx of SI or attempts, denies HI, denies psychosis and denies SA. Pt stated she lost a friend to cancer and lost her neighbor over the last year and that these have been stressors for ehr.   Attendees:  Patient:  08/07/2013 9:44 AM   Signature: Mervyn Breeona,  MD 08/07/2013 9:44 AM  Signature:  Verne Spurr, PA 08/07/2013 9:44 AM  Signature:   Linton Rump, RN 08/07/2013 9:44 AM  Signature:  Quintella Reichert, RN 08/07/2013 9:44 AM  Signature:  08/07/2013 9:44 AM  Signature:  Juline Patch, LCSW 08/07/2013 9:44 AM  Signature:  Reyes Ivan, LCSW 08/07/2013 9:44 AM  Signature:  Conard Novak Coordinator 08/07/2013 9:44 AM  Signature:  Aloha Gell, RN 08/07/2013 9:44 AM  Signature:  08/07/2013  9:44 AM  Signature:   Onnie Boer, RN [facility] 08/07/2013  9:44 AM  Signature:   08/07/2013  9:44 AM    Scribe for Treatment Team:   Juline Patch,  08/07/2013 9:44 AM

## 2013-08-07 NOTE — [facility] Suicide Risk Assessment (Signed)
Suicide Risk Assessment  Admission Assessment     Nursing information obtained from:    Demographic factors:    Current Mental Status:    Loss Factors:    Historical Factors:    Risk Reduction Factors:     CLINICAL FACTORS:   Severe Anxiety and/or Agitation Depression:   Anhedonia Hopelessness Impulsivity Insomnia Recent sense of peace/wellbeing Severe Previous Psychiatric Diagnoses and Treatments Medical Diagnoses and Treatments/Surgeries  COGNITIVE FEATURES THAT CONTRIBUTE TO RISK:  Closed-mindedness Loss of executive function Polarized thinking Thought constriction (tunnel vision)    SUICIDE RISK:   Moderate:  Frequent suicidal ideation with limited intensity, and duration, some specificity in terms of plans, no associated intent, good self-control, limited dysphoria/symptomatology, some risk factors present, and identifiable protective factors, including available and accessible social support.  PLAN OF CARE: Admitted voluntarily, emergently for depression and anxiety and not responding to out patient treatment.   I certify that inpatient services furnished can reasonably be expected to improve the patient's condition.   Kristy Hays,Kristy R. 08/07/2013, 9:13 AM

## 2013-08-07 NOTE — Progress Notes (Signed)
Pt attended spiritual care group on grief and loss facilitated by chaplain Burnis Kingfisher.  Group opened with brief discussion and psycho-social ed around grief and loss in relationships and in relation to self - identifying life patterns, circumstances, changes that cause losses. Established group norm of speaking from own life experience. Group goal of establishing open and affirming space for members to share loss and experience with grief, normalize grief experience and provide psycho social education and grief support.   Kristy Hays was present and attentive in group.   She did not contribute to group discussion.    Belva Crome MDiv
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
## 2013-08-07 NOTE — ED Notes (Signed)
Per EMS, pt came from [facility] to rule out a GI bleed. Pt had coffee ground emesis at 830PM today. Pt complains of abdominal pain, dizziness and acid reflux. Pt's last BM was on Saturday. Pt has been treated for depression at [facility] since yesterday.

## 2013-08-07 NOTE — Progress Notes (Signed)
Patient ID: Kristy Hays, female   DOB: 11-04-1945, 67 y.o.   MRN: [id number] D)  Pt was lying in bed, stated she felt too tired to go to group this evening.  Stated she hasn't felt right since her meds were adjusted.  States has been tired, no appetite, depressed.  Did come out to the med window for hs meds, went back to bed. Denies thoughts of self harm, just wants to feel right again.  Pleasant, cooperative, sad. A)  Will continue to monitor for safety, continue POC R)  Safety maintained.

## 2013-08-07 NOTE — [facility] Group Notes (Signed)
[facility] LCSW Aftercare Discharge Planning Group Note   08/07/2013 10:40 AM  Participation Quality:  Did not attend group.  Rindi Beechy, Joesph July

## 2013-08-07 NOTE — Progress Notes (Signed)
Adult Psychoeducational Group Note  Date:  08/07/2013 Time: 11:00am Group Topic/Focus:  Self Care:   The focus of this group is to help patients understand the importance of self-care in order to improve or restore emotional, physical, spiritual, interpersonal, and financial health.  Participation Level:  Active  Participation Quality:  Appropriate and Attentive  Affect:  Appropriate  Cognitive:  Alert and Appropriate  Insight: Appropriate  Engagement in Group:  Engaged  Modes of Intervention:  Discussion and Education  Additional Comments:  Pt attended and participated in group. Discussion was on self care and awareness. The question was ask what does wellness mean to you and what is one thing you do to take care of yourself? Pt stated wellness means feeling good all around. When ask what she does to take care of herself pt stated walk and go to the gym.   Shelly Bombard D 08/07/2013, 2:06 PM

## 2013-08-07 NOTE — Progress Notes (Signed)
Patient ID: Kristy Hays, female   DOB: [birth year], 67 y.o.   MRN: [id number] PER STATE REGULATIONS 482.30  THIS CHART WAS REVIEWED FOR MEDICAL NECESSITY WITH RESPECT TO THE PATIENT'S ADMISSION/ DURATION OF STAY.  NEXT REVIEW DATE: 08/09/2013  Willa Rough, RN, BSN CASE MANAGER

## 2013-08-07 NOTE — Progress Notes (Addendum)
D:  Patient's self inventory sheet, patient needs sleep medication, has poor appetite, low energy level, improving attention span.  Rated depression and hopeless 9, anxiety 10.  Denied withdrawals.  Denied SI.  Has felt lightheaded and dizziness in past 24 hours.  Worst pain #2.  "My blood pressure concerns me because it is low and makes me lightheaded, dizzy."  No problems taking meds after discharge. A:  Medications administered per MD orders.  Emotional support and encouragement given patient. R:  Denied SI and HI.   Denied A/V hallucinations.  Will continue to monitor patient for safety with 15 minute checks.  1715  Patient went to recreational activity outside this afternoon, stated she was not as dizzy as she felt this morning.  Was given ativan, tylenol and maalox this afternoon which made her feel better.  Stated she continues to have poor appetite, have encouraged fluids throughout day.  Patient has been cooperative and pleasant.

## 2013-08-07 NOTE — ED Notes (Signed)
Bed: [facility]01 Expected date:  Expected time:  Means of arrival:  Comments: PT from [facility], GI Bleed vs SBO

## 2013-08-07 NOTE — Progress Notes (Signed)
S Patient is a 67 y/o WF admitted to [facility] for mgmt of MDD. The patient has been presenting to the [facility] NS through out the day with recurrent dyspepsia sx refractory to Rx mallox. The patient is also endorsing related anorexia eructation,watery brash and dizziness. During the nursing assessment the patients BP was reported to be 84/54 and at that time the patient vomited coffee ground appearing emesis. The patient denies hx of PSBO,SBO,PUD, biliary Colic or Pancreatitis. The patient states her last BM was 48 hours ago but denies any melena or BRBPR. Patient denies baseline colorectal screening or prior EGD and endorses hx of prior hysterectomy.  0: T 98.1, BP 84/54, P 67  Orthostatic BP: sitting 111/73, P 58, standing 103/67, P 64  HEENT: sclera non icteric  Integument: W/D with fair turgor  Pulm: CTA  Cardio: audible S1,S2  ABD: hypoactive BS , exam diffusely tender without any guarding, rebound or peritoneal signs. No palpable hepatosplenomegaly  Rectal: Deferred  A/P:  Transport via EMS for further eval and mgmt of hypotension and possible SBO Agree with assessment and plan Rachael Fee, M.D.

## 2013-08-07 NOTE — ED Notes (Signed)
Pt stated that she cannot urinate.  Will try again in 30 minutes. 

## 2013-08-08 DIAGNOSIS — R45851 Suicidal ideations: Secondary | ICD-10-CM

## 2013-08-08 LAB — CBC WITH DIFFERENTIAL/PLATELET
Basophils Absolute: 0 10*3/uL (ref 0.0–0.1)
Basophils Absolute: 0 10*3/uL (ref 0.0–0.1)
Basophils Relative: 0 % (ref 0–1)
Basophils Relative: 0 % (ref 0–1)
Eosinophils Absolute: 0.1 10*3/uL (ref 0.0–0.7)
Eosinophils Relative: 1 % (ref 0–5)
HCT: 32.8 % — ABNORMAL LOW (ref 36.0–46.0)
HCT: 31.9 % — ABNORMAL LOW (ref 36.0–46.0)
Lymphocytes Relative: 17 % (ref 12–46)
Lymphocytes Relative: 22 % (ref 12–46)
Lymphs Abs: 1.3 10*3/uL (ref 0.7–4.0)
Lymphs Abs: 1.5 10*3/uL (ref 0.7–4.0)
MCH: 31.5 pg (ref 26.0–34.0)
MCHC: 36.3 g/dL — ABNORMAL HIGH (ref 30.0–36.0)
MCV: 86.8 fL (ref 78.0–100.0)
MCV: 87.4 fL (ref 78.0–100.0)
Monocytes Absolute: 0.6 10*3/uL (ref 0.1–1.0)
Neutro Abs: 4.6 10*3/uL (ref 1.7–7.7)
Neutrophils Relative %: 68 % (ref 43–77)
Platelets: 261 10*3/uL (ref 150–400)
Platelets: 243 10*3/uL (ref 150–400)
RBC: 3.78 MIL/uL — ABNORMAL LOW (ref 3.87–5.11)
RBC: 3.65 MIL/uL — ABNORMAL LOW (ref 3.87–5.11)
RDW: 12.3 % (ref 11.5–15.5)
RDW: 12.1 % (ref 11.5–15.5)
WBC: 8 10*3/uL (ref 4.0–10.5)
WBC: 6.7 10*3/uL (ref 4.0–10.5)

## 2013-08-08 LAB — COMPREHENSIVE METABOLIC PANEL
AST: 19 U/L (ref 0–37)
Albumin: 4 g/dL (ref 3.5–5.2)
BUN: 19 mg/dL (ref 6–23)
Calcium: 9.3 mg/dL (ref 8.4–10.5)
Creatinine, Ser: 1.1 mg/dL (ref 0.50–1.10)
GFR calc non Af Amer: 50 mL/min — ABNORMAL LOW (ref >90)
Total Bilirubin: 0.5 mg/dL (ref 0.3–1.2)

## 2013-08-08 LAB — URINALYSIS, ROUTINE W REFLEX MICROSCOPIC
Bilirubin Urine: NEGATIVE
Ketones, ur: NEGATIVE mg/dL
Nitrite: NEGATIVE
Protein, ur: NEGATIVE mg/dL
Urobilinogen, UA: 0.2 mg/dL (ref 0.0–1.0)

## 2013-08-08 LAB — URINE MICROSCOPIC-ADD ON

## 2013-08-08 LAB — OCCULT BLOOD, POC DEVICE: Fecal Occult Bld: NEGATIVE

## 2013-08-08 MED ORDER — POTASSIUM CHLORIDE CRYS ER 20 MEQ PO TBCR
80.0000 meq | EXTENDED_RELEASE_TABLET | Freq: Once | ORAL | Status: AC
  Administered 2013-08-08: 80 meq via ORAL

## 2013-08-08 MED ORDER — ENSURE COMPLETE PO LIQD
237.0000 mL | Freq: Three times a day (TID) | ORAL | Status: DC
  Administered 2013-08-08 – 2013-08-09 (×5): 237 mL via ORAL

## 2013-08-08 MED ORDER — POTASSIUM CHLORIDE CRYS ER 20 MEQ PO TBCR
EXTENDED_RELEASE_TABLET | ORAL | Status: AC
  Filled 2013-08-08: qty 4

## 2013-08-08 MED ORDER — ONDANSETRON 4 MG PO TBDP
4.0000 mg | ORAL_TABLET | Freq: Once | ORAL | Status: AC
  Administered 2013-08-08: 4 mg via ORAL
  Filled 2013-08-08: qty 1

## 2013-08-08 MED ORDER — PANTOPRAZOLE SODIUM 40 MG PO TBEC
40.0000 mg | DELAYED_RELEASE_TABLET | Freq: Every day | ORAL | Status: DC
  Administered 2013-08-08 – 2013-08-14 (×7): 40 mg via ORAL
  Filled 2013-08-08 (×9): qty 1

## 2013-08-08 NOTE — ED Provider Notes (Signed)
Medical screening examination/treatment/procedure(s) were conducted as a shared visit with non-physician practitioner(s) and myself.  I personally evaluated the patient during the encounter.  Worsening depression with decreased appetite and sleep. Denies SI or HI.  EKG Interpretation   None        Glynn Octave, MD 08/08/13 [phone]

## 2013-08-08 NOTE — Progress Notes (Addendum)
Patient went to ER last night, vomited, not feeling well.  Patient returned to [facility] hall.  Patient stated this morning she felt nausea, could not eat breakfast, was given crackers and water to drink.  Medications given slowly to patient to prevent nausea/vomiting.    D:  Patient's self inventory sheet, patient has poor sleep, poor appetite, low energy level.  Rated depression, hopeless and anxiety #9.  Denied withdrawals.  Denied SI.  Has felt lightheaded, pain, dizzy, headache in past 24 hours.  Wants to find something to give her an appetite.  Worst pain #4.  "I hope to once again be able to go to the gym, eat well and exercise."  Would like Boost or Ensure.  No problems taking meds after discharge. A:  Medications administered individually to patient this morning as patient has experienced nausea.  Emotional support and encouragement given patient. R:  Denied SI and HI.   Denied A/V hallucinations.  Will continue to monitor patient for safety with 15 minute checks.  Safety maintained.  1100 Ativan given to patient for anxiety this morning.  Patient continues to take morning medications individually, given time to eat cracker and drink water to prevent nausea.  1125  Patient completed her morning medications.  Stated the ativan has decreased her anxiety, still feels some nausea.  Continues to eat crackers and drink water.  Patient has been cooperative.

## 2013-08-08 NOTE — Progress Notes (Signed)
Adult Psychoeducational Group Note  Date:  08/08/2013 Time:  8:00 pm  Group Topic/Focus:  Wrap-Up Group:   The focus of this group is to help patients review their daily goal of treatment and discuss progress on daily workbooks.  Participation Level:  Did Not Attend   Modena Nunnery 08/08/2013, 9:29 PM
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
## 2013-08-08 NOTE — Progress Notes (Signed)
Recreation Therapy Notes  Date: 11.24.2014 Time: 3:00pm Location: [address] Dayroom  Group Topic: Wellness  Goal Area(s) Addresses:  Patient will define components of whole wellness. Patient will verbalize benefit of whole wellness.  Behavioral Response: Engaged, Appropriate   Intervention: Group Mind Map  Activity: Patients were asked to create a group flow chart highlighting the dimensions of wellness, as well as things they can do to personally invest in wellness.    Education: Wellness, Personal Care.    Education Outcome: Acknowledges understanding   Clinical Observations/Feedback: Patient arrived late to group session, upon arrival patient was informed of group topic. Prior to patient arrival peers identified physical, mental, social, environmental, emotional, intellectual, leisure, financial and spiritual health as parts of wellness. Patient became focused on financial wellness, linking financial wellness to all other areas of wellness. Patient made no additional statements or contributions to group discussion, but appeared to actively listen as she maintained appropriate eye contact with speaker.   Marykay Lex Amyiah Gaba, LRT/CTRS  Timmey Lamba L 08/08/2013 9:02 AM

## 2013-08-08 NOTE — Progress Notes (Signed)
D: Pt is depressed in affect and mood. Pt denied any suicidal ideation. Pt presented with ongoing heartburn this evening. Heartburn treated with Maalox. Maalox provided no relief. Pt vomited coffee ground emesis after a sitting blood pressure was obtained. Pt was hypotensive, nauseous, and weak. On-call extender contacted. Pt sent to the Ed to rule out a small bowel obstruction or GI bleed.  A:  Continued support and availability as needed was extended to this pt. Staff continue to monitor pt with q64min checks. Pt placed as a high fall risk due to nausea r/t heartburn.  R: No adverse drug reactions noted. Pt receptive to treatment. Pt remains safe at this time.   Pt released from ED with an acceptable HGB level of (11.4) according to ED physician. No concern for significant internal bleeding er ED physician. Occult blood specimen was negative. K+ was 2.9 and treated with 80 Meq of potassium chloride PO. Pt was also given 150 ml of NaCl IV. On-call extender updated on pt's resulting lab and specimen. Pt returns with nausea. Basin placed at pt's bedside. Pt informed of using call bell for assistance in ambulating while weak. An order for Ensure was obtained for this pt's inadequate food intake. Pt reports only consuming a bun and cups of water on 11/24.

## 2013-08-08 NOTE — [facility] Group Notes (Addendum)
[facility] LCSW Group Therapy  Feelings Around Diagnosis  08/08/2013 3:02 PM  Type of Therapy:  Group Therapy  Participation Level:  Did Not Attend   Wynn Banker 08/08/2013, 3:02 PM

## 2013-08-08 NOTE — Progress Notes (Signed)
Recreation Therapy Notes  Date: 11.25.2014 Time: 2:45pm Location: [address] Dayroom   Group Topic: Software engineer Activities (AAA)  Behavioral Response: Engaged, Appropriate   Affect: Euthymic  Clinical Observations/Feedback: Dog Team: [employer]. Patient interacted appropriately with peer, dog team, and LRT.   Marykay Lex Alijah Hyde, LRT/CTRS  Jearl Klinefelter 08/08/2013 4:58 PM

## 2013-08-08 NOTE — Progress Notes (Signed)
The focus of this group is to educate the patient on the purpose and policies of crisis stabilization and provide a format to answer questions about their admission.  The group details unit policies and expectations of patients while admitted.  Patient could not attend 0900 nurse education orientation group this morning.  Patient stayed in bed this morning, not feeling well, feeling nauseated, went to ER last night.

## 2013-08-08 NOTE — ED Provider Notes (Addendum)
CSN: [id number]     Arrival date & time 08/07/13  2223 History   First MD Initiated Contact with Patient 08/07/13 2305     Chief Complaint  Patient presents with  . GI Bleeding   (Consider location/radiation/quality/duration/timing/severity/associated sxs/prior Treatment) HPI Comments: Pt with htn, hl, depression sent from behavioral health with cc of coffee ground emesis. Pt has no hx of liver disease, denies heavy alcohol, asa/nsaid use and denies any hx of PUD/Gastritis. Pt reports one episode of emesis -brown in color. She has no abd pain, and is not nauseated. Last BM today, normal, no melena. No hx of hematemesis.   The history is provided by the patient.    Past Medical History  Diagnosis Date  . Glaucoma   . Hypertension   . Hyperlipidemia   . Anxiety   . Depression    Past Surgical History  Procedure Laterality Date  . Abdominal hysterectomy     Family History  Problem Relation Age of Onset  . Cancer Neg Hx   . Heart attack Mother   . Hypertension Mother   . Hypertension Sister    History  Substance Use Topics  . Smoking status: Former Games developer  . Smokeless tobacco: Not on file     Comment: Quit 24 years ago,  . Alcohol Use: No     Comment: Very seldom-5 times a year   OB History   Grav Para Term Preterm Abortions TAB SAB Ect Mult Living                 Review of Systems  Constitutional: Negative for activity change.  Respiratory: Negative for shortness of breath.   Cardiovascular: Negative for chest pain.  Gastrointestinal: Positive for vomiting. Negative for nausea, abdominal pain, blood in stool and anal bleeding.  Genitourinary: Negative for dysuria.  Musculoskeletal: Negative for neck pain.  Neurological: Negative for dizziness and headaches.    Allergies  Antihistamines, chlorpheniramine-type; Benadryl; and Penicillins  Home Medications   Current Outpatient Rx  Name  Route  Sig  Dispense  Refill  . amLODipine (NORVASC) 10 MG tablet    Oral   Take 5 mg by mouth daily.         Marland Kitchen atorvastatin (LIPITOR) 40 MG tablet   Oral   Take 40 mg by mouth daily.         . busPIRone (BUSPAR) 10 MG tablet   Oral   Take 10 mg by mouth 3 (three) times daily.         . carvedilol (COREG) 25 MG tablet   Oral   Take 25 mg by mouth 2 (two) times daily with a meal.         . fish oil-omega-3 fatty acids 1000 MG capsule   Oral   Take 1 g by mouth daily.         Marland Kitchen lisinopril (PRINIVIL,ZESTRIL) 20 MG tablet   Oral   Take 20 mg by mouth daily.         Marland Kitchen lisinopril-hydrochlorothiazide (PRINZIDE,ZESTORETIC) 20-25 MG per tablet   Oral   Take 1 tablet by mouth daily.         Marland Kitchen loratadine (CLARITIN) 10 MG tablet   Oral   Take 10 mg by mouth daily.         Marland Kitchen LORazepam (ATIVAN) 1 MG tablet   Oral   Take 1 mg by mouth every 8 (eight) hours as needed for anxiety.         Marland Kitchen  mirtazapine (REMERON) 30 MG tablet   Oral   Take 30 mg by mouth at bedtime.         . Multiple Vitamin (MULTIVITAMIN WITH MINERALS) TABS tablet   Oral   Take 1 tablet by mouth daily.         . sertraline (ZOLOFT) 25 MG tablet   Oral   Take 50 mg by mouth at bedtime.         . Travoprost, BAK Free, (TRAVATAN) 0.004 % SOLN ophthalmic solution   Both Eyes   Place 1 drop into both eyes at bedtime.          . traZODone (DESYREL) 50 MG tablet   Oral   Take 100 mg by mouth at bedtime.           BP 135/66  Pulse 76  Temp(Src) 97.9 F (36.6 C) (Oral)  Resp 18  Ht 5\' 4"  (1.626 m)  Wt 157 lb (71.215 kg)  BMI 26.94 kg/m2  SpO2 94% Physical Exam  Nursing note and vitals reviewed. Constitutional: She is oriented to person, place, and time. She appears well-developed and well-nourished.  HENT:  Head: Normocephalic and atraumatic.  Eyes: EOM are normal. Pupils are equal, round, and reactive to light.  Neck: Neck supple.  Cardiovascular: Normal rate, regular rhythm and normal heart sounds.   No murmur heard. Pulmonary/Chest:  Effort normal. No respiratory distress.  Abdominal: Soft. She exhibits no distension. There is no tenderness. There is no rebound and no guarding.  Neurological: She is alert and oriented to person, place, and time.  Skin: Skin is warm and dry.    ED Course  Procedures (including critical care time) Labs Review Labs Reviewed  CBC WITH DIFFERENTIAL - Abnormal; Notable for the following:    RBC 3.78 (*)    Hemoglobin 11.9 (*)    HCT 32.8 (*)    MCHC 36.3 (*)    All other components within normal limits  URINALYSIS, ROUTINE W REFLEX MICROSCOPIC - Abnormal; Notable for the following:    APPearance CLOUDY (*)    Hgb urine dipstick TRACE (*)    Leukocytes, UA LARGE (*)    All other components within normal limits  URINE MICROSCOPIC-ADD ON - Abnormal; Notable for the following:    Bacteria, UA FEW (*)    Casts HYALINE CASTS (*)    All other components within normal limits  URINE CULTURE  COMPREHENSIVE METABOLIC PANEL   Imaging Review No results found.  EKG Interpretation   None       MDM   1. Major depressive disorder, recurrent severe without psychotic features    Pt comes in with brown emesis. Pt has no hx of GI bleed. She has no risk factors for upper GI bleed. Pt has no abd tenderness, and her Hb x 2 have been 11.9 and 11.4. Pt is guaiac neg, no melena,  Doesn't appear to be a case UGIB. Not sure what made her have emesis x 1. Stable for discharge. Potassium replaced.   Derwood Kaplan, MD 08/08/13 1610  Derwood Kaplan, MD 08/08/13 [phone]

## 2013-08-08 NOTE — Progress Notes (Signed)
Patient ID: Kristy Hays, female   DOB: 08/07/1946, 68 y.o.   MRN: [id number]  D: Pt was laying in bed during the adm process. Informed the writer that she feels "a lil better, but still having a hard time getting my appetite". Stated her meds caused her to have a decreased appetite. Pt stated her son visited, but doesn't understand how she doesn't want to eat. Writer encouraged pt to eat, because she needs to keep her strength.   A:  Support and encouragement was offered. 15 min checks continued for safety.  R: Pt remains safe.

## 2013-08-08 NOTE — [facility] Suicide Risk Assessment (Signed)
[facility] INPATIENT:  Family/Significant Other Suicide Prevention Education  Suicide Prevention Education:  Education Completed; Kristy Hays, Daughter, [phone]; has been identified by the patient as the family member/significant other with whom the patient will be residing, and identified as the person(s) who will aid the patient in the event of a mental health crisis (suicidal ideations/suicide attempt).  With written consent from the patient, the family member/significant other has been provided the following suicide prevention education, prior to the and/or following the discharge of the patient.  The suicide prevention education provided includes the following:  Suicide risk factors  Suicide prevention and interventions  National Suicide Hotline telephone number  [facility] assessment telephone number  [facility] Emergency Assistance 911  [facility] and/or Residential Mobile Crisis Unit telephone number  Request made of family/significant other to:  Remove weapons (e.g., guns, rifles, knives), all items previously/currently identified as safety concern.  Daughter advised patient does not have access to weapons.   Remove drugs/medications (over-the-counter, prescriptions, illicit drugs), all items previously/currently identified as a safety concern.  The family member/significant other verbalizes understanding of the suicide prevention education information provided.  The family member/significant other agrees to remove the items of safety concern listed above.  Kristy Hays 08/08/2013, 12:19 PM

## 2013-08-08 NOTE — Progress Notes (Signed)
Adult Psychoeducational Group Note  Date:  08/08/2013 Time:  11:00am  Group Topic/Focus:  Recovery Goals:   The focus of this group is to identify appropriate goals for recovery and establish a plan to achieve them.  Participation Level:   Participation Quality:    Affect:   Cognitive:    Insight:   Engagement in Group:    Modes of Intervention:  Additional Comments:  Pt did not attend group  Shelly Bombard D 08/08/2013, 2:20 PM

## 2013-08-08 NOTE — Progress Notes (Signed)
[facility] MD Progress Note  08/08/2013 1:05 PM Kristy Hays  MRN:  [id number] Subjective:  Patient reports that she had nausea last night and abdominal pain. She went to the ED and was treated. States she has no appetite and has lost weight. She notes that she is a little bit better with the medication changes and rates her depression as a 9/10 and her anxiety as a 8/10. Diagnosis:   DSM5: Schizophrenia Disorders:  Obsessive-Compulsive Disorders:  Trauma-Stressor Disorders:  Substance/Addictive Disorders:  Depressive Disorders: Major Depressive Disorder - Severe (296.23)  AXIS I: Major Depression, Recurrent severe  AXIS II: Deferred  AXIS III:  Past Medical History   Diagnosis  Date   .  Glaucoma    .  Hypertension    .  Hyperlipidemia    .  Anxiety    .  Depression     AXIS IV: economic problems, housing problems, other psychosocial or environmental problems, problems related to social environment and problems with primary support group  AXIS V: 41-50 serious symptoms   ADL's:  Impaired  Sleep: Poor  Appetite:  Poor  Suicidal Ideation:  decreasing Homicidal Ideation:  denies AEB (as evidenced by):  Psychiatric Specialty Exam: Review of Systems  Constitutional: Negative.  Negative for fever, chills, weight loss, malaise/fatigue and diaphoresis.  HENT: Negative for congestion and sore throat.   Eyes: Negative for blurred vision, double vision and photophobia.  Respiratory: Negative for cough, shortness of breath and wheezing.   Cardiovascular: Negative for chest pain, palpitations and PND.  Gastrointestinal: Positive for nausea, vomiting and abdominal pain. Negative for heartburn, diarrhea and constipation.  Musculoskeletal: Negative for falls, joint pain and myalgias.  Neurological: Negative for dizziness, tingling, tremors, sensory change, speech change, focal weakness, seizures, loss of consciousness, weakness and headaches.  Endo/Heme/Allergies: Negative for polydipsia.  Does not bruise/bleed easily.  Psychiatric/Behavioral: Negative for depression, suicidal ideas, hallucinations, memory loss and substance abuse. The patient is not nervous/anxious and does not have insomnia.     Blood pressure 122/82, pulse 72, temperature 98.5 F (36.9 C), temperature source Oral, resp. rate 16, height 5\' 4"  (1.626 m), weight 71.215 kg (157 lb), SpO2 96.00%.Body mass index is 26.94 kg/(m^2).  General Appearance: Disheveled  Eye Contact::  Good  Speech:  Clear and Coherent  Volume:  Normal  Mood:  Dysphoric  Affect:  Non-Congruent  Thought Process:  Goal Directed  Orientation:  Full (Time, Place, and Person)  Thought Content:  WDL  Suicidal Thoughts:  Yes.  without intent/plan  Homicidal Thoughts:  No  Memory:  NA  Judgement:  Impaired  Insight:  Shallow  Psychomotor Activity:  Normal  Concentration:  Fair  Recall:  Fair  Akathisia:  No  Handed:  Right  AIMS (if indicated):     Assets:  Communication Skills Desire for Improvement  Sleep:  Number of Hours: 6.75   Current Medications: Current Facility-Administered Medications  Medication Dose Route Frequency Provider Last Rate Last Dose  . acetaminophen (TYLENOL) tablet 650 mg  650 mg Oral Q6H PRN Shuvon Rankin, NP   650 mg at 08/07/13 1509  . alum & mag hydroxide-simeth (MAALOX/MYLANTA) 200-200-20 MG/5ML suspension 30 mL  30 mL Oral Q4H PRN Shuvon Rankin, NP   30 mL at 08/07/13 1937  . amLODipine (NORVASC) tablet 5 mg  5 mg Oral Daily Shuvon Rankin, NP   5 mg at 08/08/13 1119  . ARIPiprazole (ABILIFY) tablet 2 mg  2 mg Oral BID Nehemiah Settle, MD   2 mg  at 08/08/13 0957  . atorvastatin (LIPITOR) tablet 40 mg  40 mg Oral QPM Shuvon Rankin, NP   40 mg at 08/07/13 1715  . busPIRone (BUSPAR) tablet 10 mg  10 mg Oral TID Shuvon Rankin, NP   10 mg at 08/08/13 1014  . carvedilol (COREG) tablet 25 mg  25 mg Oral BID WC Shuvon Rankin, NP   25 mg at 08/08/13 1119  . feeding supplement (ENSURE COMPLETE) (ENSURE  COMPLETE) liquid 237 mL  237 mL Oral TID WC Mena Goes Simon, PA-C   237 mL at 08/08/13 1118  . lisinopril (PRINIVIL,ZESTRIL) tablet 20 mg  20 mg Oral Daily Shuvon Rankin, NP   20 mg at 08/08/13 1120   And  . hydrochlorothiazide (HYDRODIURIL) tablet 25 mg  25 mg Oral Daily Shuvon Rankin, NP   25 mg at 08/08/13 1120  . loratadine (CLARITIN) tablet 10 mg  10 mg Oral Daily Shuvon Rankin, NP   10 mg at 08/08/13 1044  . LORazepam (ATIVAN) tablet 1 mg  1 mg Oral Q8H PRN Shuvon Rankin, NP   1 mg at 08/08/13 1058  . magnesium hydroxide (MILK OF MAGNESIA) suspension 30 mL  30 mL Oral Daily PRN Shuvon Rankin, NP      . mirtazapine (REMERON) tablet 15 mg  15 mg Oral QHS Nehemiah Settle, MD      . multivitamin with minerals tablet 1 tablet  1 tablet Oral Daily Shuvon Rankin, NP   1 tablet at 08/08/13 1050  . omega-3 acid ethyl esters (LOVAZA) capsule 1 g  1 g Oral Daily Shuvon Rankin, NP   1 g at 08/08/13 1100  . PARoxetine (PAXIL-CR) 24 hr tablet 12.5 mg  12.5 mg Oral Daily Nehemiah Settle, MD   12.5 mg at 08/08/13 1032  . Travoprost (BAK Free) (TRAVATAN) 0.004 % ophthalmic solution SOLN 1 drop  1 drop Both Eyes QHS Shuvon Rankin, NP   1 drop at 08/06/13 2141  . traZODone (DESYREL) tablet 100 mg  100 mg Oral QHS Shuvon Rankin, NP   100 mg at 08/06/13 2139    Lab Results:  Results for orders placed during the hospital encounter of 08/06/13 (from the past 48 hour(s))  CBC WITH DIFFERENTIAL     Status: Abnormal   Collection Time    08/07/13 11:11 PM      Result Value Range   WBC 8.0  4.0 - 10.5 K/uL   RBC 3.78 (*) 3.87 - 5.11 MIL/uL   Hemoglobin 11.9 (*) 12.0 - 15.0 g/dL   HCT 16.1 (*) 09.6 - 04.5 %   MCV 86.8  78.0 - 100.0 fL   MCH 31.5  26.0 - 34.0 pg   MCHC 36.3 (*) 30.0 - 36.0 g/dL   RDW 40.9  81.1 - 91.4 %   Platelets 261  150 - 400 K/uL   Neutrophils Relative % 75  43 - 77 %   Neutro Abs 5.9  1.7 - 7.7 K/uL   Lymphocytes Relative 17  12 - 46 %   Lymphs Abs 1.3  0.7 - 4.0  K/uL   Monocytes Relative 8  3 - 12 %   Monocytes Absolute 0.6  0.1 - 1.0 K/uL   Eosinophils Relative 1  0 - 5 %   Eosinophils Absolute 0.1  0.0 - 0.7 K/uL   Basophils Relative 0  0 - 1 %   Basophils Absolute 0.0  0.0 - 0.1 K/uL  COMPREHENSIVE METABOLIC PANEL     Status: Abnormal  Collection Time    08/07/13 11:11 PM      Result Value Range   Sodium 130 (*) 135 - 145 mEq/L   Potassium 2.9 (*) 3.5 - 5.1 mEq/L   Comment: DELTA CHECK NOTED   Chloride 89 (*) 96 - 112 mEq/L   CO2 31  19 - 32 mEq/L   Glucose, Bld 132 (*) 70 - 99 mg/dL   BUN 19  6 - 23 mg/dL   Creatinine, Ser 1.61  0.50 - 1.10 mg/dL   Calcium 9.3  8.4 - 09.6 mg/dL   Total Protein 6.6  6.0 - 8.3 g/dL   Albumin 4.0  3.5 - 5.2 g/dL   AST 19  0 - 37 U/L   ALT 19  0 - 35 U/L   Alkaline Phosphatase 59  39 - 117 U/L   Total Bilirubin 0.5  0.3 - 1.2 mg/dL   GFR calc non Af Amer 50 (*) >90 mL/min   GFR calc Af Amer 58 (*) >90 mL/min   Comment: (NOTE)     The eGFR has been calculated using the CKD EPI equation.     This calculation has not been validated in all clinical situations.     eGFR's persistently <90 mL/min signify possible Chronic Kidney     Disease.  URINALYSIS, ROUTINE W REFLEX MICROSCOPIC     Status: Abnormal   Collection Time    08/08/13 12:10 AM      Result Value Range   Color, Urine YELLOW  YELLOW   APPearance CLOUDY (*) CLEAR   Specific Gravity, Urine 1.020  1.005 - 1.030   pH 6.5  5.0 - 8.0   Glucose, UA NEGATIVE  NEGATIVE mg/dL   Hgb urine dipstick TRACE (*) NEGATIVE   Bilirubin Urine NEGATIVE  NEGATIVE   Ketones, ur NEGATIVE  NEGATIVE mg/dL   Protein, ur NEGATIVE  NEGATIVE mg/dL   Urobilinogen, UA 0.2  0.0 - 1.0 mg/dL   Nitrite NEGATIVE  NEGATIVE   Leukocytes, UA LARGE (*) NEGATIVE  URINE MICROSCOPIC-ADD ON     Status: Abnormal   Collection Time    08/08/13 12:10 AM      Result Value Range   Squamous Epithelial / LPF RARE  RARE   WBC, UA 11-20  <3 WBC/hpf   RBC / HPF 0-2  <3 RBC/hpf    Bacteria, UA FEW (*) RARE   Casts HYALINE CASTS (*) NEGATIVE  OCCULT BLOOD, POC DEVICE     Status: None   Collection Time    08/08/13  2:09 AM      Result Value Range   Fecal Occult Bld NEGATIVE  NEGATIVE  CBC WITH DIFFERENTIAL     Status: Abnormal   Collection Time    08/08/13  3:15 AM      Result Value Range   WBC 6.7  4.0 - 10.5 K/uL   RBC 3.65 (*) 3.87 - 5.11 MIL/uL   Hemoglobin 11.4 (*) 12.0 - 15.0 g/dL   HCT 04.5 (*) 40.9 - 81.1 %   MCV 87.4  78.0 - 100.0 fL   MCH 31.2  26.0 - 34.0 pg   MCHC 35.7  30.0 - 36.0 g/dL   RDW 91.4  78.2 - 95.6 %   Platelets 243  150 - 400 K/uL   Neutrophils Relative % 68  43 - 77 %   Neutro Abs 4.6  1.7 - 7.7 K/uL   Lymphocytes Relative 22  12 - 46 %   Lymphs Abs 1.5  0.7 - 4.0 K/uL   Monocytes Relative 10  3 - 12 %   Monocytes Absolute 0.6  0.1 - 1.0 K/uL   Eosinophils Relative 1  0 - 5 %   Eosinophils Absolute 0.1  0.0 - 0.7 K/uL   Basophils Relative 0  0 - 1 %   Basophils Absolute 0.0  0.0 - 0.1 K/uL    Physical Findings: AIMS: Facial and Oral Movements Muscles of Facial Expression: None, normal Lips and Perioral Area: None, normal Jaw: None, normal Tongue: None, normal,Extremity Movements Upper (arms, wrists, hands, fingers): None, normal Lower (legs, knees, ankles, toes): None, normal, Trunk Movements Neck, shoulders, hips: None, normal, Overall Severity Severity of abnormal movements (highest score from questions above): None, normal Incapacitation due to abnormal movements: None, normal Patient's awareness of abnormal movements (rate only patient's report): No Awareness, Dental Status Current problems with teeth and/or dentures?: No Does patient usually wear dentures?: No  CIWA:  CIWA-Ar Total: 1 COWS:  COWS Total Score: 4  Treatment Plan Summary: Daily contact with patient to assess and evaluate symptoms and progress in treatment Medication management  Plan: 1. Continue crisis management and stabilization. 2. Medication  management to reduce current symptoms to base line and improve patient's overall level of functioning 3. Treat health problems as indicated. 4. Develop treatment plan to decrease risk of relapse upon discharge and the need for readmission. 5. Psycho-social education regarding relapse prevention and self care. 6. Health care follow up as needed for medical problems. 7. Continue home medications where appropriate. 8. Will order Omeprazole for her GI symptoms and continue to monitor all of her symptoms.  Medical Decision Making Problem Points:  Established problem, stable/improving (1) Data Points:  Review or order clinical lab tests (1) Review or order medicine tests (1)  I certify that inpatient services furnished can reasonably be expected to improve the patient's condition.  Rona Ravens. Mashburn RPAC 2:17 PM 08/08/2013 Reviewed the information documented and agree with the treatment plan.  Guyla Bless,JANARDHAHA R. 08/14/2013 8:52 AM

## 2013-08-09 LAB — URINE CULTURE

## 2013-08-09 MED ORDER — BUSPIRONE HCL 15 MG PO TABS
15.0000 mg | ORAL_TABLET | Freq: Three times a day (TID) | ORAL | Status: DC
  Administered 2013-08-09 – 2013-08-14 (×15): 15 mg via ORAL
  Filled 2013-08-09 (×21): qty 1

## 2013-08-09 MED ORDER — ENSURE COMPLETE PO LIQD
237.0000 mL | Freq: Three times a day (TID) | ORAL | Status: DC
  Administered 2013-08-09 – 2013-08-11 (×4): 237 mL via ORAL

## 2013-08-09 MED ORDER — BISMUTH SUBSALICYLATE 262 MG PO CHEW
524.0000 mg | CHEWABLE_TABLET | ORAL | Status: DC | PRN
  Filled 2013-08-09: qty 2

## 2013-08-09 NOTE — Progress Notes (Signed)
Patient ID: Kristy Hays, female   DOB: [birth year], 67 y.o.   MRN: [id number] [facility] MD Progress Note  08/09/2013 1:38 PM Toba Claudio  MRN:  [id number] Subjective:  Patient still notes that she is having nausea and now diarrhea, her appetite is not what it should be. She is drinking ensure prior to meals and feels that she can not eat meals. She is asking about having her anxiety medications increased to scheduled doses. Diagnosis:   DSM5: Schizophrenia Disorders:  Obsessive-Compulsive Disorders:  Trauma-Stressor Disorders:  Substance/Addictive Disorders:  Depressive Disorders: Major Depressive Disorder - Severe (296.23)  AXIS I: Major Depression, Recurrent severe  AXIS II: Deferred  AXIS III:  Past Medical History   Diagnosis  Date   .  Glaucoma    .  Hypertension    .  Hyperlipidemia    .  Anxiety    .  Depression     AXIS IV: economic problems, housing problems, other psychosocial or environmental problems, problems related to social environment and problems with primary support group  AXIS V: 41-50 serious symptoms   ADL's:  Impaired  Sleep: Poor  Appetite:  Poor  Suicidal Ideation:  decreasing Homicidal Ideation:  denies AEB (as evidenced by):  Psychiatric Specialty Exam: Review of Systems  Constitutional: Negative.  Negative for fever, chills, weight loss, malaise/fatigue and diaphoresis.  HENT: Negative for congestion and sore throat.   Eyes: Negative for blurred vision, double vision and photophobia.  Respiratory: Negative for cough, shortness of breath and wheezing.   Cardiovascular: Negative for chest pain, palpitations and PND.  Gastrointestinal: Positive for nausea, vomiting and abdominal pain. Negative for heartburn, diarrhea and constipation.  Musculoskeletal: Negative for falls, joint pain and myalgias.  Neurological: Negative for dizziness, tingling, tremors, sensory change, speech change, focal weakness, seizures, loss of consciousness, weakness and  headaches.  Endo/Heme/Allergies: Negative for polydipsia. Does not bruise/bleed easily.  Psychiatric/Behavioral: Negative for depression, suicidal ideas, hallucinations, memory loss and substance abuse. The patient is not nervous/anxious and does not have insomnia.     Blood pressure 149/84, pulse 68, temperature 98.4 F (36.9 C), temperature source Oral, resp. rate 18, height 5\' 4"  (1.626 m), weight 71.215 kg (157 lb), SpO2 96.00%.Body mass index is 26.94 kg/(m^2).  General Appearance: Disheveled  Eye Contact::  Good  Speech:  Clear and Coherent  Volume:  Normal  Mood:  Dysphoric  Affect:  Non-Congruent  Thought Process:  Goal Directed  Orientation:  Full (Time, Place, and Person)  Thought Content:  WDL  Suicidal Thoughts:  Yes.  without intent/plan  Homicidal Thoughts:  No  Memory:  NA  Judgement:  Impaired  Insight:  Shallow  Psychomotor Activity:  Normal  Concentration:  Fair  Recall:  Fair  Akathisia:  No  Handed:  Right  AIMS (if indicated):     Assets:  Communication Skills Desire for Improvement  Sleep:  Number of Hours: 6.75   Current Medications: Current Facility-Administered Medications  Medication Dose Route Frequency Provider Last Rate Last Dose  . acetaminophen (TYLENOL) tablet 650 mg  650 mg Oral Q6H PRN Shuvon Rankin, NP   650 mg at 08/07/13 1509  . alum & mag hydroxide-simeth (MAALOX/MYLANTA) 200-200-20 MG/5ML suspension 30 mL  30 mL Oral Q4H PRN Shuvon Rankin, NP   30 mL at 08/07/13 1937  . amLODipine (NORVASC) tablet 5 mg  5 mg Oral Daily Shuvon Rankin, NP   5 mg at 08/09/13 0832  . ARIPiprazole (ABILIFY) tablet 2 mg  2 mg Oral BID Tomasita Crumble  Filbert Schilder, MD   2 mg at 08/09/13 0829  . atorvastatin (LIPITOR) tablet 40 mg  40 mg Oral QPM Shuvon Rankin, NP   40 mg at 08/08/13 1852  . busPIRone (BUSPAR) tablet 10 mg  10 mg Oral TID Shuvon Rankin, NP   10 mg at 08/09/13 1203  . carvedilol (COREG) tablet 25 mg  25 mg Oral BID WC Shuvon Rankin, NP   25 mg at  08/09/13 0831  . feeding supplement (ENSURE COMPLETE) (ENSURE COMPLETE) liquid 237 mL  237 mL Oral TID WC Mena Goes Simon, PA-C   237 mL at 08/09/13 1204  . lisinopril (PRINIVIL,ZESTRIL) tablet 20 mg  20 mg Oral Daily Shuvon Rankin, NP   20 mg at 08/09/13 0831   And  . hydrochlorothiazide (HYDRODIURIL) tablet 25 mg  25 mg Oral Daily Shuvon Rankin, NP   25 mg at 08/09/13 0830  . loratadine (CLARITIN) tablet 10 mg  10 mg Oral Daily Shuvon Rankin, NP   10 mg at 08/09/13 0832  . LORazepam (ATIVAN) tablet 1 mg  1 mg Oral Q8H PRN Shuvon Rankin, NP   1 mg at 08/09/13 1203  . magnesium hydroxide (MILK OF MAGNESIA) suspension 30 mL  30 mL Oral Daily PRN Shuvon Rankin, NP      . mirtazapine (REMERON) tablet 15 mg  15 mg Oral QHS Nehemiah Settle, MD   15 mg at 08/08/13 2228  . multivitamin with minerals tablet 1 tablet  1 tablet Oral Daily Shuvon Rankin, NP   1 tablet at 08/09/13 0829  . omega-3 acid ethyl esters (LOVAZA) capsule 1 g  1 g Oral Daily Shuvon Rankin, NP   1 g at 08/09/13 0828  . pantoprazole (PROTONIX) EC tablet 40 mg  40 mg Oral Daily Verne Spurr, PA-C   40 mg at 08/09/13 0830  . PARoxetine (PAXIL-CR) 24 hr tablet 12.5 mg  12.5 mg Oral Daily Nehemiah Settle, MD   12.5 mg at 08/09/13 0829  . Travoprost (BAK Free) (TRAVATAN) 0.004 % ophthalmic solution SOLN 1 drop  1 drop Both Eyes QHS Shuvon Rankin, NP   1 drop at 08/08/13 2228  . traZODone (DESYREL) tablet 100 mg  100 mg Oral QHS Shuvon Rankin, NP   100 mg at 08/08/13 2227    Lab Results:  Results for orders placed during the hospital encounter of 08/06/13 (from the past 48 hour(s))  CBC WITH DIFFERENTIAL     Status: Abnormal   Collection Time    08/07/13 11:11 PM      Result Value Range   WBC 8.0  4.0 - 10.5 K/uL   RBC 3.78 (*) 3.87 - 5.11 MIL/uL   Hemoglobin 11.9 (*) 12.0 - 15.0 g/dL   HCT 40.9 (*) 81.1 - 91.4 %   MCV 86.8  78.0 - 100.0 fL   MCH 31.5  26.0 - 34.0 pg   MCHC 36.3 (*) 30.0 - 36.0 g/dL   RDW 78.2   95.6 - 21.3 %   Platelets 261  150 - 400 K/uL   Neutrophils Relative % 75  43 - 77 %   Neutro Abs 5.9  1.7 - 7.7 K/uL   Lymphocytes Relative 17  12 - 46 %   Lymphs Abs 1.3  0.7 - 4.0 K/uL   Monocytes Relative 8  3 - 12 %   Monocytes Absolute 0.6  0.1 - 1.0 K/uL   Eosinophils Relative 1  0 - 5 %   Eosinophils Absolute 0.1  0.0 -  0.7 K/uL   Basophils Relative 0  0 - 1 %   Basophils Absolute 0.0  0.0 - 0.1 K/uL  COMPREHENSIVE METABOLIC PANEL     Status: Abnormal   Collection Time    08/07/13 11:11 PM      Result Value Range   Sodium 130 (*) 135 - 145 mEq/L   Potassium 2.9 (*) 3.5 - 5.1 mEq/L   Comment: DELTA CHECK NOTED   Chloride 89 (*) 96 - 112 mEq/L   CO2 31  19 - 32 mEq/L   Glucose, Bld 132 (*) 70 - 99 mg/dL   BUN 19  6 - 23 mg/dL   Creatinine, Ser 0.45  0.50 - 1.10 mg/dL   Calcium 9.3  8.4 - 40.9 mg/dL   Total Protein 6.6  6.0 - 8.3 g/dL   Albumin 4.0  3.5 - 5.2 g/dL   AST 19  0 - 37 U/L   ALT 19  0 - 35 U/L   Alkaline Phosphatase 59  39 - 117 U/L   Total Bilirubin 0.5  0.3 - 1.2 mg/dL   GFR calc non Af Amer 50 (*) >90 mL/min   GFR calc Af Amer 58 (*) >90 mL/min   Comment: (NOTE)     The eGFR has been calculated using the CKD EPI equation.     This calculation has not been validated in all clinical situations.     eGFR's persistently <90 mL/min signify possible Chronic Kidney     Disease.  URINALYSIS, ROUTINE W REFLEX MICROSCOPIC     Status: Abnormal   Collection Time    08/08/13 12:10 AM      Result Value Range   Color, Urine YELLOW  YELLOW   APPearance CLOUDY (*) CLEAR   Specific Gravity, Urine 1.020  1.005 - 1.030   pH 6.5  5.0 - 8.0   Glucose, UA NEGATIVE  NEGATIVE mg/dL   Hgb urine dipstick TRACE (*) NEGATIVE   Bilirubin Urine NEGATIVE  NEGATIVE   Ketones, ur NEGATIVE  NEGATIVE mg/dL   Protein, ur NEGATIVE  NEGATIVE mg/dL   Urobilinogen, UA 0.2  0.0 - 1.0 mg/dL   Nitrite NEGATIVE  NEGATIVE   Leukocytes, UA LARGE (*) NEGATIVE  URINE MICROSCOPIC-ADD ON      Status: Abnormal   Collection Time    08/08/13 12:10 AM      Result Value Range   Squamous Epithelial / LPF RARE  RARE   WBC, UA 11-20  <3 WBC/hpf   RBC / HPF 0-2  <3 RBC/hpf   Bacteria, UA FEW (*) RARE   Casts HYALINE CASTS (*) NEGATIVE  URINE CULTURE     Status: None   Collection Time    08/08/13 12:10 AM      Result Value Range   Specimen Description URINE, CLEAN CATCH     Special Requests NONE     Culture  Setup Time       Value: 08/08/2013 05:17     Performed at [facility] Count       Value: NO GROWTH     Performed at [facility]   Culture       Value: NO GROWTH     Performed at [facility]   Report Status 08/09/2013 FINAL    OCCULT BLOOD, POC DEVICE     Status: None   Collection Time    08/08/13  2:09 AM      Result Value Range   Fecal  Occult Bld NEGATIVE  NEGATIVE  CBC WITH DIFFERENTIAL     Status: Abnormal   Collection Time    08/08/13  3:15 AM      Result Value Range   WBC 6.7  4.0 - 10.5 K/uL   RBC 3.65 (*) 3.87 - 5.11 MIL/uL   Hemoglobin 11.4 (*) 12.0 - 15.0 g/dL   HCT 16.1 (*) 09.6 - 04.5 %   MCV 87.4  78.0 - 100.0 fL   MCH 31.2  26.0 - 34.0 pg   MCHC 35.7  30.0 - 36.0 g/dL   RDW 40.9  81.1 - 91.4 %   Platelets 243  150 - 400 K/uL   Neutrophils Relative % 68  43 - 77 %   Neutro Abs 4.6  1.7 - 7.7 K/uL   Lymphocytes Relative 22  12 - 46 %   Lymphs Abs 1.5  0.7 - 4.0 K/uL   Monocytes Relative 10  3 - 12 %   Monocytes Absolute 0.6  0.1 - 1.0 K/uL   Eosinophils Relative 1  0 - 5 %   Eosinophils Absolute 0.1  0.0 - 0.7 K/uL   Basophils Relative 0  0 - 1 %   Basophils Absolute 0.0  0.0 - 0.1 K/uL    Physical Findings: AIMS: Facial and Oral Movements Muscles of Facial Expression: None, normal Lips and Perioral Area: None, normal Jaw: None, normal Tongue: None, normal,Extremity Movements Upper (arms, wrists, hands, fingers): None, normal Lower (legs, knees, ankles, toes): None, normal, Trunk Movements Neck,  shoulders, hips: None, normal, Overall Severity Severity of abnormal movements (highest score from questions above): None, normal Incapacitation due to abnormal movements: None, normal Patient's awareness of abnormal movements (rate only patient's report): No Awareness, Dental Status Current problems with teeth and/or dentures?: No Does patient usually wear dentures?: No  CIWA:  CIWA-Ar Total: 1 COWS:  COWS Total Score: 4  Treatment Plan Summary: Daily contact with patient to assess and evaluate symptoms and progress in treatment Medication management  Plan: 1. Continue crisis management and stabilization. 2. Buspar is increased to 15 mg po TID. 3. Continue all other medications as written.    Medical Decision Making Problem Points:  Established problem, stable/improving (1) Data Points:  Review or order clinical lab tests (1) Review or order medicine tests (1)  I certify that inpatient services furnished can reasonably be expected to improve the patient's condition.  Rona Ravens. Mashburn RPAC 1:38 PM 08/09/2013 Agree with assessment and plan Madie Reno A. Dub Mikes, M.D.

## 2013-08-09 NOTE — Progress Notes (Signed)
Recreation Therapy Notes  Date: 11.26.2014 Time: 3:00pm Location: [address] Dayroom   Group Topic: Coping Skills  Goal Area(s) Addresses:  Patient will complete grateful mandala. Patient will identify benefit of identifying things he/she is grateful for.    Behavioral Response: Did not attend. Patient asked to leave group as it was starting to meet with PA, patient returned as group was ending.   Marykay Lex Genavie Boettger, LRT/CTRS  Jearl Klinefelter 08/09/2013 3:53 PM

## 2013-08-09 NOTE — Progress Notes (Signed)
D: Patient pleasant and cooperative with staff. Patient has appropriate affect and depressed mood. She reported on the self inventory sheet that her sleep is fair, appetite is poor, energy level is low and ability to pay attention is improving. Patient rated depression "9" and feelings of hopelessness "8". She's visible in the milieu and interactive with peers; attending groups and compliant with medications.  A: Support and encouragement provided to patient. Administered scheduled medications per ordering MD. Monitor Q15 minute checks.  R: Patient receptive. Denies SI/HI and auditory/visual hallucinations. Patient remains safe on the unit.

## 2013-08-09 NOTE — [facility] Group Notes (Signed)
[facility] LCSW Aftercare Discharge Planning Group Note   08/09/2013 10:03 AM    Participation Quality:  Appropraite  Mood/Affect:  Appropriate  Depression Rating:  8  Anxiety Rating:  8  Thoughts of Suicide:  No  Will you contract for safety?   NA  Current AVH:  No  Plan for Discharge/Comments:  Patient attended discharge planning group and actively participated in group.  Patient agrees to counseling and will be scheduled with Bennie Pierini where she sees Dr. Tanda Rockers.   CSW provided all participants with daily workbook.   Transportation Means: Patient has transportation.   Supports:  Patient has a support system.   Modean Mccullum, Joesph July

## 2013-08-09 NOTE — Progress Notes (Signed)
Pt did not attend group. When encouraged to do so she stated not tonight and that she would rather sleep

## 2013-08-09 NOTE — [facility] Group Notes (Signed)
[facility] LCSW Group Therapy  Emotional Regulation 1:15  -  2:30   08/09/2013 2:24 PM  Type of Therapy:  Group Therapy  Participation Level:  Active  Participation Quality:  Appropriate, Attentive and Supportive  Affect:  Appropriate  Cognitive:  Alert and Appropriate  Insight:  Developing/Improving and Engaged  Engagement in Therapy:  Developing/Improving and Engaged  Modes of Intervention:  Discussion, Education, Exploration, Problem-solving, Rapport Building, Socialization and Support  Summary of Progress/Problems:   Group topic was emotional regulations.  Patient participated in the discussion and was able to identify an emotion that needed to regulated.Patient shared her emotion is grief.  She advised she talked with her friend on a daily basis prior to her death two months ago.  She shared she plans to get involved with volunteering to keep herself busy once she is stabilized on meds and discharges from the hospital.    Wynn Banker 08/09/2013, 2:24 PM

## 2013-08-09 NOTE — Progress Notes (Signed)
Patient ID: Kristy Hays, female   DOB: [birth year], 67 y.o.   MRN: [id number]   D: Pt was laying in bed asleep thru most of the shift.  When asked about her day pt stated, "If they can get my combination of meds together I would be ok. Writer encouraged pt to continue to take meds as prescribed.   A:  Support and encouragement was offered. 15 min checks continued for safety.  R: Pt remains safe.

## 2013-08-09 NOTE — Progress Notes (Signed)
Patient ID: Kristy Hays, female   DOB: 04/23/1946, 67 y.o.   MRN: [id number] PER STATE REGULATIONS 482.30  THIS CHART WAS REVIEWED FOR MEDICAL NECESSITY WITH RESPECT TO THE PATIENT'S ADMISSION/ DURATION OF STAY.  NEXT REVIEW DATE: 08/13/2013  Willa Rough, RN, BSN CASE MANAGER

## 2013-08-10 MED ORDER — GATORADE (BH)
240.0000 mL | Freq: Three times a day (TID) | ORAL | Status: DC
  Administered 2013-08-10 – 2013-08-14 (×9): 240 mL via ORAL
  Filled 2013-08-10: qty 480

## 2013-08-10 NOTE — [facility] Group Notes (Signed)
[facility] Group Notes:  (Nursing/MHT/Case Management/Adjunct)  Date:  08/10/2013  Time:  0915  Type of Therapy:  Psychoeducational Skills  Participation Level:  Active  Participation Quality:  Appropriate  Affect:  Appropriate  Cognitive:  Alert and Appropriate  Insight:  Appropriate  Engagement in Group:  Supportive  Modes of Intervention:  Support  Summary of Progress/Problems:  [facility] Kristy Hays [employer] 08/10/2013, 10:52 AM

## 2013-08-10 NOTE — Progress Notes (Signed)
D.  Pt. Denies SI/HI and denies A/V hallucinations.  Interacting appropriately with peers and staff.  Reported anxiety and that she has become anxious since she started being depressed. A.  Antianxiety medication given. R.  Pt. Reported decreased anxiety.

## 2013-08-10 NOTE — Progress Notes (Signed)
D: pt asleep, no signs of distress or labored breathing A: q 15 min safety checks R: pt. Remains safe on unit 

## 2013-08-10 NOTE — Progress Notes (Signed)
Patient ID: Kristy Hays, female   DOB: 1946-08-12, 67 y.o.   MRN: [id number] [facility] MD Progress Note  08/10/2013 11:06 AM Kristy Hays  MRN:  [id number] Subjective:  Patient staying in her room, states she is dizzy, then light headed, then anxious. She has multiple somatic complaints and is requesting increases in her anxiety medication. She felt that she may fall and did not go to lunch today, and complained that lunch was not brought to her. She can not describe what her symptoms are or feel like, but when pressed she has no on going symptoms of anxiety. She is not unsteady on her feet, has not fallen, and does not describe vertigo symptoms.  Diagnosis:   DSM5: Schizophrenia Disorders:  Obsessive-Compulsive Disorders:  Trauma-Stressor Disorders:  Substance/Addictive Disorders:  Depressive Disorders: Major Depressive Disorder - Severe (296.23)  AXIS I: Major Depression, Recurrent severe  AXIS II: Deferred  AXIS III:  Past Medical History   Diagnosis  Date   .  Glaucoma    .  Hypertension    .  Hyperlipidemia    .  Anxiety    .  Depression     AXIS IV: economic problems, housing problems, other psychosocial or environmental problems, problems related to social environment and problems with primary support group  AXIS V: 41-50 serious symptoms   ADL's:  Impaired  Sleep: Poor  Appetite:  Poor  Suicidal Ideation:  decreasing Homicidal Ideation:  denies AEB (as evidenced by):  Psychiatric Specialty Exam: Review of Systems  Constitutional: Negative.  Negative for fever, chills, weight loss, malaise/fatigue and diaphoresis.  HENT: Negative for congestion and sore throat.   Eyes: Negative for blurred vision, double vision and photophobia.  Respiratory: Negative for cough, shortness of breath and wheezing.   Cardiovascular: Negative for chest pain, palpitations and PND.  Gastrointestinal: Positive for nausea, vomiting and abdominal pain. Negative for heartburn, diarrhea and  constipation.  Musculoskeletal: Negative for falls, joint pain and myalgias.  Neurological: Negative for dizziness, tingling, tremors, sensory change, speech change, focal weakness, seizures, loss of consciousness, weakness and headaches.  Endo/Heme/Allergies: Negative for polydipsia. Does not bruise/bleed easily.  Psychiatric/Behavioral: Negative for depression, suicidal ideas, hallucinations, memory loss and substance abuse. The patient is not nervous/anxious and does not have insomnia.     Blood pressure 148/86, pulse 71, temperature 97.6 F (36.4 C), temperature source Oral, resp. rate 16, height 5\' 4"  (1.626 m), weight 71.215 kg (157 lb), SpO2 96.00%.Body mass index is 26.94 kg/(m^2).  General Appearance: Disheveled  Eye Contact::  Good  Speech:  Clear and Coherent  Volume:  Normal  Mood:  Dysphoric  Affect:  Non-Congruent  Thought Process:  Goal Directed  Orientation:  Full (Time, Place, and Person)  Thought Content:  WDL  Suicidal Thoughts:  Yes.  without intent/plan  Homicidal Thoughts:  No  Memory:  NA  Judgement:  Impaired  Insight:  Shallow  Psychomotor Activity:  Normal  Concentration:  Fair  Recall:  Fair  Akathisia:  No  Handed:  Right  AIMS (if indicated):     Assets:  Communication Skills Desire for Improvement  Sleep:  Number of Hours: 6.75   Current Medications: Current Facility-Administered Medications  Medication Dose Route Frequency Provider Last Rate Last Dose  . acetaminophen (TYLENOL) tablet 650 mg  650 mg Oral Q6H PRN Shuvon Rankin, NP   650 mg at 08/07/13 1509  . alum & mag hydroxide-simeth (MAALOX/MYLANTA) 200-200-20 MG/5ML suspension 30 mL  30 mL Oral Q4H PRN Shuvon Rankin, NP  30 mL at 08/07/13 1937  . amLODipine (NORVASC) tablet 5 mg  5 mg Oral Daily Shuvon Rankin, NP   5 mg at 08/10/13 0808  . ARIPiprazole (ABILIFY) tablet 2 mg  2 mg Oral BID Nehemiah Settle, MD   2 mg at 08/10/13 0809  . atorvastatin (LIPITOR) tablet 40 mg  40 mg Oral  QPM Shuvon Rankin, NP   40 mg at 08/09/13 1803  . bismuth subsalicylate (PEPTO BISMOL) chewable tablet 524 mg  524 mg Oral PRN Verne Spurr, PA-C      . busPIRone (BUSPAR) tablet 15 mg  15 mg Oral TID Verne Spurr, PA-C   15 mg at 08/10/13 0811  . carvedilol (COREG) tablet 25 mg  25 mg Oral BID WC Shuvon Rankin, NP   25 mg at 08/10/13 0811  . feeding supplement (ENSURE COMPLETE) (ENSURE COMPLETE) liquid 237 mL  237 mL Oral TID PC Neil Mashburn, PA-C   237 mL at 08/10/13 0809  . lisinopril (PRINIVIL,ZESTRIL) tablet 20 mg  20 mg Oral Daily Shuvon Rankin, NP   20 mg at 08/10/13 0809   And  . hydrochlorothiazide (HYDRODIURIL) tablet 25 mg  25 mg Oral Daily Shuvon Rankin, NP   25 mg at 08/10/13 0811  . loratadine (CLARITIN) tablet 10 mg  10 mg Oral Daily Shuvon Rankin, NP   10 mg at 08/10/13 0810  . LORazepam (ATIVAN) tablet 1 mg  1 mg Oral Q8H PRN Shuvon Rankin, NP   1 mg at 08/09/13 1203  . magnesium hydroxide (MILK OF MAGNESIA) suspension 30 mL  30 mL Oral Daily PRN Shuvon Rankin, NP      . mirtazapine (REMERON) tablet 15 mg  15 mg Oral QHS Nehemiah Settle, MD   15 mg at 08/09/13 2149  . multivitamin with minerals tablet 1 tablet  1 tablet Oral Daily Shuvon Rankin, NP   1 tablet at 08/10/13 0810  . omega-3 acid ethyl esters (LOVAZA) capsule 1 g  1 g Oral Daily Shuvon Rankin, NP   1 g at 08/10/13 0812  . pantoprazole (PROTONIX) EC tablet 40 mg  40 mg Oral Daily Verne Spurr, PA-C   40 mg at 08/10/13 4098  . PARoxetine (PAXIL-CR) 24 hr tablet 12.5 mg  12.5 mg Oral Daily Nehemiah Settle, MD   12.5 mg at 08/10/13 0810  . Travoprost (BAK Free) (TRAVATAN) 0.004 % ophthalmic solution SOLN 1 drop  1 drop Both Eyes QHS Shuvon Rankin, NP   1 drop at 08/09/13 2149  . traZODone (DESYREL) tablet 100 mg  100 mg Oral QHS Shuvon Rankin, NP   100 mg at 08/09/13 2149    Lab Results:  No results found for this or any previous visit (from the past 48 hour(s)).  Physical Findings: AIMS:  Facial and Oral Movements Muscles of Facial Expression: None, normal Lips and Perioral Area: None, normal Jaw: None, normal Tongue: None, normal,Extremity Movements Upper (arms, wrists, hands, fingers): None, normal Lower (legs, knees, ankles, toes): None, normal, Trunk Movements Neck, shoulders, hips: None, normal, Overall Severity Severity of abnormal movements (highest score from questions above): None, normal Incapacitation due to abnormal movements: None, normal Patient's awareness of abnormal movements (rate only patient's report): No Awareness, Dental Status Current problems with teeth and/or dentures?: No Does patient usually wear dentures?: No  CIWA:  CIWA-Ar Total: 1 COWS:  COWS Total Score: 4  Treatment Plan Summary: Daily contact with patient to assess and evaluate symptoms and progress in treatment Medication management  Plan:  1. Continue crisis management and stabilization. 2. Patient's request for increase in Ativan is denied. 3. She is instructed to sit on the side of the bed for a few minutes before trying to walk.     Her gait was examined and she is steady with no obvious instability. 4. She is to go to the dining hall for all meals. 5. She is not to remain in bed during the day. She must attend all groups. 6. She can have Gatorade and discontinue the Ensure as she is likely getting hypoglycemic after the Ensure wears off. She is instructed not to have Ensure before meals.   Medical Decision Making Problem Points:  Established problem, stable/improving (1) Data Points:  Review or order clinical lab tests (1) Review or order medicine tests (1)  I certify that inpatient services furnished can reasonably be expected to improve the patient's condition.  Rona Ravens. Mashburn RPAC 11:06 AM 08/10/2013 Agree with assessment and plan Madie Reno A. Dub Mikes, M.D.

## 2013-08-10 NOTE — Progress Notes (Signed)
D: Patient has appropriate affect and depressed mood. She reported on the self inventory sheet that she's sleeping fair, appetite is poor, energy level is low and ability to pay attention is improving. Patient rated depression "8". Continues to participate in groups and tolerating medications well.  A: Support and encouragement provided to patient. Scheduled medications administered per MD orders. Maintain Q15 minute checks for safety.  R: Patient receptive. Denies SI/HI and auditory/visual hallucinations. Patient remains safe.

## 2013-08-11 MED ORDER — SENNOSIDES-DOCUSATE SODIUM 8.6-50 MG PO TABS
1.0000 | ORAL_TABLET | Freq: Every day | ORAL | Status: DC
  Administered 2013-08-11 – 2013-08-12 (×2): 1 via ORAL
  Filled 2013-08-11 (×6): qty 1

## 2013-08-11 MED ORDER — HYDROXYZINE HCL 25 MG PO TABS
25.0000 mg | ORAL_TABLET | Freq: Four times a day (QID) | ORAL | Status: DC | PRN
  Administered 2013-08-11 – 2013-08-13 (×6): 25 mg via ORAL
  Filled 2013-08-11 (×5): qty 1

## 2013-08-11 MED ORDER — MIRTAZAPINE 15 MG PO TABS
7.5000 mg | ORAL_TABLET | Freq: Every day | ORAL | Status: DC
  Administered 2013-08-11 – 2013-08-13 (×3): 7.5 mg via ORAL
  Filled 2013-08-11: qty 1
  Filled 2013-08-11 (×5): qty 0.5

## 2013-08-11 MED ORDER — HYDROXYZINE HCL 25 MG PO TABS
ORAL_TABLET | ORAL | Status: AC
  Administered 2013-08-11: 25 mg via ORAL
  Filled 2013-08-11: qty 1

## 2013-08-11 MED ORDER — PAROXETINE HCL ER 25 MG PO TB24
25.0000 mg | ORAL_TABLET | Freq: Every day | ORAL | Status: DC
  Administered 2013-08-12 – 2013-08-13 (×2): 25 mg via ORAL
  Filled 2013-08-11 (×4): qty 1

## 2013-08-11 NOTE — Progress Notes (Signed)
Adult Psychoeducational Group Note  Date:  08/11/2013 Time:  1:15PM  Group Topic/Focus:  Therapeutic Activity  Participation Level:  Did Not Attend  Additional Comments:  Pt opted not to attend the group session   Barth Kirks 08/11/2013, 2:40 PM

## 2013-08-11 NOTE — Progress Notes (Signed)
D:  Patient up and present in the milieu, but not attending groups.  Is interacting some with peers.  Taking all medications as scheduled and tolerating them well.  Appetite is good.  Affect blunted.   A:  Offered support and encouragement.  Medications given as ordered.  Encouraged patient to attend groups. R:  Cooperative with staff.  Interacting well with peers.  Tolerating medications.  Safety is maintained.

## 2013-08-11 NOTE — Progress Notes (Signed)
Adult Psychoeducational Group Note  Date:  08/11/2013 Time:  10:30 PM  Group Topic/Focus:  Wrap-Up Group:   The focus of this group is to help patients review their daily goal of treatment and discuss progress on daily workbooks.  Participation Level:  Active  Participation Quality:  Appropriate  Affect:  Appropriate  Cognitive:  Appropriate  Insight: Appropriate  Engagement in Group:  Engaged  Modes of Intervention:  Discussion  Additional Comments:  Pt stated she had a good day today because she got to see her daughter and granddaughter.  Caswell Corwin 08/11/2013, 10:30 PM

## 2013-08-11 NOTE — Progress Notes (Signed)
[facility] MD Progress Note  08/11/2013 12:58 PM Kristy Hays  MRN:  [id number] Subjective:  HPI Comments: Kristy Hays is  a 67 y/o female with a past psychiatric history significant for Major Depression, Recurrent severe.   . Location: The patient is currently inpatient.  The patient reports she is feeling more anxious. . Quality: Patient denies current suicidal ideation, intent, or plan. Patient denies current homicidal ideation, intent, or plan. Patient denies auditory hallucinations. Patient denies visual hallucinations. Patient denies symptoms of paranoia. Patient states sleep is good. Appetite is good/fair/bad. Energy level is good/high/low/poor. Patient endorses/denies symptoms of anhedonia. Patient endorses hopelessness, denies helplessness guilt.  . Severity: Moderate to several . Duration: 6-7 days  . Timing: Worse in the morning, and in the afternoon.  . Context: Anxiety over she will get better. Friend's death. . Modifying factors: None. . Associated signs and symptoms:  Denies any recent episodes consistent with mania, particularly decreased need for sleep with increased energy, grandiosity, impulsivity, hyperverbal and pressured speech, or increased productivity. Denies any  recent symptoms consistent with psychosis, particularly auditory or visual hallucinations, thought broadcasting/insertion/withdrawal, or ideas of reference. Denies any history of trauma or symptoms consistent with PTSD such as flashbacks, nightmares, hypervigilance, feelings of numbness or inability to connect with others.   Diagnosis:   DSM5: AXIS I: Major Depression, Recurrent severe  AXIS II: Deferred  AXIS III:  Past Medical History   Diagnosis  Date   .  Glaucoma    .  Hypertension    .  Hyperlipidemia    .  Anxiety    .  Depression    AXIS IV: economic problems, housing problems, other psychosocial or environmental problems, problems related to social environment and problems with primary support  group  AXIS V: 41-50 serious symptoms   ADL's:  Intact  Sleep: Negative  Appetite:  Fair  Suicidal Ideation:  Plan:  Patient denies. Intent:  Patient denies. Means:  Patient denies. Homicidal Ideation:  Plan:  Patient denies. Intent:  Patient denies. Means:  Patient denies. AEB (as evidenced by):  Psychiatric Specialty Exam: ROS  Blood pressure 121/81, pulse 89, temperature 97.8 F (36.6 C), temperature source Oral, resp. rate 20, height 5\' 4"  (1.626 m), weight 71.215 kg (157 lb), SpO2 96.00%.Body mass index is 26.94 kg/(m^2).  General Appearance: Casual and Well Groomed  Eye Contact::  Good  Speech:  Clear and Coherent and Normal Rate  Volume:  Normal  Mood:  Anxious  Affect:  Appropriate and Congruent  Thought Process:  Coherent, Linear and Logical  Orientation:  Full (Time, Place, and Person)  Thought Content:  WDL  Suicidal Thoughts:  No  Homicidal Thoughts:  No  Memory:  Immediate;   Good Recent;   Fair Remote;   Poor  Judgement:  Fair  Insight:  Present  Psychomotor Activity:  Normal  Concentration:  Good  Recall:  Good  Akathisia:  No  Handed:  Right  AIMS (if indicated):   As noted in the chart.  Assets:  Communication Skills Desire for Improvement Financial Resources/Insurance Housing Intimacy Leisure Time Physical Health Resilience Social Support Talents/Skills Transportation  Sleep:  Number of Hours: 6.5   Current Medications: Current Facility-Administered Medications  Medication Dose Route Frequency Provider Last Rate Last Dose  . acetaminophen (TYLENOL) tablet 650 mg  650 mg Oral Q6H PRN Shuvon Rankin, NP   650 mg at 08/07/13 1509  . alum & mag hydroxide-simeth (MAALOX/MYLANTA) 200-200-20 MG/5ML suspension 30 mL  30 mL Oral Q4H PRN  Shuvon Rankin, NP   30 mL at 08/07/13 1937  . amLODipine (NORVASC) tablet 5 mg  5 mg Oral Daily Shuvon Rankin, NP   5 mg at 08/11/13 0840  . ARIPiprazole (ABILIFY) tablet 2 mg  2 mg Oral BID Nehemiah Settle, MD   2 mg at 08/11/13 0839  . atorvastatin (LIPITOR) tablet 40 mg  40 mg Oral QPM Shuvon Rankin, NP   40 mg at 08/10/13 1812  . bismuth subsalicylate (PEPTO BISMOL) chewable tablet 524 mg  524 mg Oral PRN Verne Spurr, PA-C      . busPIRone (BUSPAR) tablet 15 mg  15 mg Oral TID Verne Spurr, PA-C   15 mg at 08/11/13 0839  . carvedilol (COREG) tablet 25 mg  25 mg Oral BID WC Shuvon Rankin, NP   25 mg at 08/11/13 0840  . feeding supplement (ENSURE COMPLETE) (ENSURE COMPLETE) liquid 237 mL  237 mL Oral TID PC Neil Mashburn, PA-C   237 mL at 08/10/13 1305  . gatorade (BH)  240 mL Oral TID PC Neil Mashburn, PA-C   240 mL at 08/11/13 0900  . lisinopril (PRINIVIL,ZESTRIL) tablet 20 mg  20 mg Oral Daily Shuvon Rankin, NP   20 mg at 08/11/13 0840   And  . hydrochlorothiazide (HYDRODIURIL) tablet 25 mg  25 mg Oral Daily Shuvon Rankin, NP   25 mg at 08/11/13 0840  . loratadine (CLARITIN) tablet 10 mg  10 mg Oral Daily Shuvon Rankin, NP   10 mg at 08/11/13 0840  . LORazepam (ATIVAN) tablet 1 mg  1 mg Oral Q8H PRN Shuvon Rankin, NP   1 mg at 08/10/13 1713  . magnesium hydroxide (MILK OF MAGNESIA) suspension 30 mL  30 mL Oral Daily PRN Shuvon Rankin, NP      . mirtazapine (REMERON) tablet 15 mg  15 mg Oral QHS Nehemiah Settle, MD   15 mg at 08/10/13 2106  . multivitamin with minerals tablet 1 tablet  1 tablet Oral Daily Shuvon Rankin, NP   1 tablet at 08/11/13 0839  . omega-3 acid ethyl esters (LOVAZA) capsule 1 g  1 g Oral Daily Shuvon Rankin, NP   1 g at 08/11/13 0839  . pantoprazole (PROTONIX) EC tablet 40 mg  40 mg Oral Daily Verne Spurr, PA-C   40 mg at 08/11/13 0840  . PARoxetine (PAXIL-CR) 24 hr tablet 12.5 mg  12.5 mg Oral Daily Nehemiah Settle, MD   12.5 mg at 08/11/13 0840  . Travoprost (BAK Free) (TRAVATAN) 0.004 % ophthalmic solution SOLN 1 drop  1 drop Both Eyes QHS Shuvon Rankin, NP   1 drop at 08/10/13 2107  . traZODone (DESYREL) tablet 100 mg  100 mg Oral  QHS Shuvon Rankin, NP   100 mg at 08/10/13 2106    Lab Results: No results found for this or any previous visit (from the past 48 hour(s)).  Physical Findings: AIMS: Facial and Oral Movements Muscles of Facial Expression: None, normal Lips and Perioral Area: None, normal Jaw: None, normal Tongue: None, normal,Extremity Movements Upper (arms, wrists, hands, fingers): None, normal Lower (legs, knees, ankles, toes): None, normal, Trunk Movements Neck, shoulders, hips: None, normal, Overall Severity Severity of abnormal movements (highest score from questions above): None, normal Incapacitation due to abnormal movements: None, normal Patient's awareness of abnormal movements (rate only patient's report): No Awareness, Dental Status Current problems with teeth and/or dentures?: No Does patient usually wear dentures?: No  CIWA:  CIWA-Ar Total: 1 COWS:  COWS  Total Score: 4  Treatment Plan Summary: Daily contact with patient to assess and evaluate symptoms and progress in treatment Medication management   Plan:  1. Continue crisis management and stabilization.  2. Patient's request for increase in Ativan is denied.  3. She is instructed to sit on the side of the bed for a few minutes before trying to walk.  Her gait was examined and she is steady with no obvious instability.  4. She is to go to the dining hall for all meals.  5. She is not to remain in bed during the day. She must attend all groups.  6. She can have Gatorade and discontinue the Ensure as she is likely getting hypoglycemic after the Ensure wears off. She is instructed not to have Ensure before meals.  7. Decrease mirtazapine 7.5 mg.  Increase Paxil to 25 mg.  Start Hydroxyzine 25 mg QID Anxiety. Medical Decision Making Problem Points:  Established problem, worsening (2) and Review of psycho-social stressors (1) Data Points:  Order Aims Assessment (2) Review and summation of old records (2) Review of medication regiment  & side effects (2) Review of new medications or change in dosage (2)  I certify that inpatient services furnished can reasonably be expected to improve the patient's condition.   Kylina Vultaggio 08/11/2013, 12:58 PM

## 2013-08-12 MED ORDER — LATANOPROST 0.005 % OP SOLN
1.0000 [drp] | Freq: Every day | OPHTHALMIC | Status: DC
  Administered 2013-08-13: 1 [drp] via OPHTHALMIC
  Filled 2013-08-12: qty 2.5

## 2013-08-12 NOTE — Progress Notes (Signed)
Psychoeducational Group Note  Date: 08/12/2013 Time:  1015  Group Topic/Focus:  Identifying Needs:   The focus of this group is to help patients identify their personal needs that have been historically problematic and identify healthy behaviors to address their needs.  Participation Level:  Active  Participation Quality:  Appropriate  Affect:  Appropriate  Cognitive:  Appropriate  Insight:  Improving  Engagement in Group:  Engaged  Additional Comments:    Lorayne Getchell A  

## 2013-08-12 NOTE — Progress Notes (Signed)
[facility] MD Progress Note  08/12/2013 4:59 PM Kristy Hays  MRN:  [id number] Subjective:  HPI Comments: Mrs. Dinius is  a 67 y/o female with a past psychiatric history significant for Major Depression, Recurrent severe.   . Location: The patient is currently inpatient.  The patient reports she is feeling more anxious. . Quality: The patient reports she is eating better. She reports she did well with the intiation of hydroxyzine and has been above to avoid ativan. She is doing well with the increase of Paxil.  Patient denies current suicidal ideation, intent, or plan. Patient denies current homicidal ideation, intent, or plan. Patient denies auditory hallucinations. Patient denies visual hallucinations. Patient denies symptoms of paranoia. Patient states sleep is good. Appetite is fair. Energy level is good/high/low/poor. Patient endorses/denies symptoms of anhedonia. Patient endorses hopelessness, denies helplessness guilt.   . Severity: Moderate to several . Duration: 6-7 days  . Timing: Worse in the morning, and in the afternoon.  . Context: Anxiety over she will get better. Friend's death. . Modifying factors: None. . Associated signs and symptoms:  Denies any recent episodes consistent with mania, particularly decreased need for sleep with increased energy, grandiosity, impulsivity, hyperverbal and pressured speech, or increased productivity. Denies any  recent symptoms consistent with psychosis, particularly auditory or visual hallucinations, thought broadcasting/insertion/withdrawal, or ideas of reference. Denies any history of trauma or symptoms consistent with PTSD such as flashbacks, nightmares, hypervigilance, feelings of numbness or inability to connect with others.   Diagnosis:   DSM5: AXIS I: Major Depression, Recurrent severe  AXIS II: Deferred  AXIS III:  Past Medical History   Diagnosis  Date   .  Glaucoma    .  Hypertension    .  Hyperlipidemia    .  Anxiety    .   Depression    AXIS IV: economic problems, housing problems, other psychosocial or environmental problems, problems related to social environment and problems with primary support group  AXIS V: 41-50 serious symptoms   ADL's:  Intact  Sleep: Negative  Appetite:  Fair  Suicidal Ideation:  Plan:  Patient denies. Intent:  Patient denies. Means:  Patient denies. Homicidal Ideation:  Plan:  Patient denies. Intent:  Patient denies. Means:  Patient denies. AEB (as evidenced by):  Psychiatric Specialty Exam: ROS   Blood pressure 122/66, pulse 73, temperature 98 F (36.7 C), temperature source Oral, resp. rate 16, height 5\' 4"  (1.626 m), weight 71.215 kg (157 lb), SpO2 96.00%.Body mass index is 26.94 kg/(m^2).  General Appearance: Casual and Well Groomed  Eye Contact::  Good  Speech:  Clear and Coherent and Normal Rate  Volume:  Normal  Mood:  Anxious  Affect:  Appropriate and Congruent  Thought Process:  Coherent, Linear and Logical  Orientation:  Full (Time, Place, and Person)  Thought Content:  WDL  Suicidal Thoughts:  No  Homicidal Thoughts:  No  Memory:  Immediate;   Good Recent;   Fair Remote;   Poor  Judgement:  Fair  Insight:  Present  Psychomotor Activity:  Normal  Concentration:  Good  Recall:  Good  Akathisia:  No  Handed:  Right  AIMS (if indicated):   As noted in the chart.  Assets:  Communication Skills Desire for Improvement Financial Resources/Insurance Housing Intimacy Leisure Time Physical Health Resilience Social Support Talents/Skills Transportation  Sleep:  Number of Hours: 6.75   Current Medications: Current Facility-Administered Medications  Medication Dose Route Frequency Provider Last Rate Last Dose  . acetaminophen (TYLENOL) tablet  650 mg  650 mg Oral Q6H PRN Shuvon Rankin, NP   650 mg at 08/07/13 1509  . amLODipine (NORVASC) tablet 5 mg  5 mg Oral Daily Shuvon Rankin, NP   5 mg at 08/12/13 0820  . ARIPiprazole (ABILIFY) tablet 2 mg  2  mg Oral BID Nehemiah Settle, MD   2 mg at 08/12/13 0820  . atorvastatin (LIPITOR) tablet 40 mg  40 mg Oral QPM Shuvon Rankin, NP   40 mg at 08/11/13 1807  . bismuth subsalicylate (PEPTO BISMOL) chewable tablet 524 mg  524 mg Oral PRN Verne Spurr, PA-C      . busPIRone (BUSPAR) tablet 15 mg  15 mg Oral TID Verne Spurr, PA-C   15 mg at 08/12/13 1152  . carvedilol (COREG) tablet 25 mg  25 mg Oral BID WC Shuvon Rankin, NP   25 mg at 08/12/13 0820  . feeding supplement (ENSURE COMPLETE) (ENSURE COMPLETE) liquid 237 mL  237 mL Oral TID PC Neil Mashburn, PA-C   237 mL at 08/11/13 1410  . gatorade (BH)  240 mL Oral TID PC Neil Mashburn, PA-C   240 mL at 08/12/13 1255  . lisinopril (PRINIVIL,ZESTRIL) tablet 20 mg  20 mg Oral Daily Shuvon Rankin, NP   20 mg at 08/12/13 1610   And  . hydrochlorothiazide (HYDRODIURIL) tablet 25 mg  25 mg Oral Daily Shuvon Rankin, NP   25 mg at 08/12/13 0820  . hydrOXYzine (ATARAX/VISTARIL) tablet 25 mg  25 mg Oral Q6H PRN Larena Sox, MD   25 mg at 08/12/13 1246  . loratadine (CLARITIN) tablet 10 mg  10 mg Oral Daily Shuvon Rankin, NP   10 mg at 08/12/13 0820  . LORazepam (ATIVAN) tablet 1 mg  1 mg Oral Q8H PRN Shuvon Rankin, NP   1 mg at 08/11/13 2044  . magnesium hydroxide (MILK OF MAGNESIA) suspension 30 mL  30 mL Oral Daily PRN Shuvon Rankin, NP      . mirtazapine (REMERON) tablet 7.5 mg  7.5 mg Oral QHS Larena Sox, MD   7.5 mg at 08/11/13 2211  . multivitamin with minerals tablet 1 tablet  1 tablet Oral Daily Shuvon Rankin, NP   1 tablet at 08/12/13 0820  . omega-3 acid ethyl esters (LOVAZA) capsule 1 g  1 g Oral Daily Shuvon Rankin, NP   1 g at 08/12/13 0824  . pantoprazole (PROTONIX) EC tablet 40 mg  40 mg Oral Daily Verne Spurr, PA-C   40 mg at 08/12/13 0820  . PARoxetine (PAXIL-CR) 24 hr tablet 25 mg  25 mg Oral Daily Larena Sox, MD   25 mg at 08/12/13 0820  . senna-docusate (Senokot-S) tablet 1 tablet  1 tablet Oral QHS Larena Sox, MD   1 tablet at 08/11/13 2211  . Travoprost (BAK Free) (TRAVATAN) 0.004 % ophthalmic solution SOLN 1 drop  1 drop Both Eyes QHS Shuvon Rankin, NP   1 drop at 08/11/13 2212  . traZODone (DESYREL) tablet 100 mg  100 mg Oral QHS Shuvon Rankin, NP   100 mg at 08/11/13 2212    Lab Results: No results found for this or any previous visit (from the past 48 hour(s)).  Physical Findings: AIMS: Facial and Oral Movements Muscles of Facial Expression: None, normal Lips and Perioral Area: None, normal Jaw: None, normal Tongue: None, normal,Extremity Movements Upper (arms, wrists, hands, fingers): None, normal Lower (legs, knees, ankles, toes): None, normal, Trunk Movements Neck, shoulders, hips: None, normal,  Overall Severity Severity of abnormal movements (highest score from questions above): None, normal Incapacitation due to abnormal movements: None, normal Patient's awareness of abnormal movements (rate only patient's report): No Awareness, Dental Status Current problems with teeth and/or dentures?: No Does patient usually wear dentures?: No  CIWA:  CIWA-Ar Total: 1 COWS:  COWS Total Score: 4  Treatment Plan Summary: Daily contact with patient to assess and evaluate symptoms and progress in treatment Medication management   Plan:  1. Continue crisis management and stabilization.  2. Patient's request for increase in Ativan is denied.  3. She is instructed to sit on the side of the bed for a few minutes before trying to walk.  Her gait was examined and she is steady with no obvious instability.  4. She is to go to the dining hall for all meals.  5. She is not to remain in bed during the day. She must attend all groups.  6. She can have Gatorade and discontinue the Ensure as she is likely getting hypoglycemic after the Ensure wears off. She is instructed not to have Ensure before meals.  7. Continue mirtazapine 7.5 mg.  Continue Paxil to 25 mg.  Continue Hydroxyzine 25 mg QID  Anxiety. Medical Decision Making Problem Points:  Established problem, worsening (2) and Review of psycho-social stressors (1) Data Points:  Order Aims Assessment (2) Review and summation of old records (2) Review of medication regiment & side effects (2) Review of new medications or change in dosage (2)  I certify that inpatient services furnished can reasonably be expected to improve the patient's condition.   Jomel Whittlesey 08/12/2013, 4:59 PM

## 2013-08-12 NOTE — Progress Notes (Signed)
D) Pt has been attending the program and interacting with select peers. Affect is flat and mood depressed. Rates her depression at a 6 and her hopelessness at a 3. Has anxiety and has been medicated for ti twice todoay.  C/O being constipated. Participates in the groups and has good insight. A) given support, reassurance and praise. Encouraged to work on her Saturday  Booklet, especially to list the positives about herself. R) Denies SI and HI.

## 2013-08-12 NOTE — Progress Notes (Signed)
D: pt is sitting in dayroom. Pt states that today was a good day. Pt is upbeat and smiling. Pt states she attended all the groups. Pt states she is feeling a little anxious. Denies SI/HI/AVH. Denies having any pain at the moment. A: 1:1 time given. q 15 min safety checks. Support and encouragement given. scheduled meds given R: pt remains safe on unit.

## 2013-08-12 NOTE — Progress Notes (Signed)
 .  Psychoeducational Group Note    Date: 08/12/2013 Time: 0930  Goal Setting Purpose of Group: To be able to set a goal that is measurable and that can be accomplished in one day Participation Level:  Active  Participation Quality:  Attentive  Affect:  Appropriate  Cognitive:  Oriented  Insight:  Improving  Engagement in Group:  Engaged  Additional Comments:   Rachna Schonberger A 

## 2013-08-12 NOTE — Progress Notes (Signed)
[facility] Group Notes:  (Nursing/MHT/Case Management/Adjunct)  Date:  08/12/2013  Time:  3:25 PM  Type of Therapy:  Psychoeducational Skills  Participation Level:  Active  Participation Quality:  Appropriate  Affect:  Appropriate  Cognitive:  Appropriate  Insight:  Appropriate  Engagement in Group:  Engaged  Modes of Intervention:  Activity  Summary of Progress/Problems:   Kristy Hays 08/12/2013, 3:25 PM

## 2013-08-12 NOTE — [facility] Group Notes (Signed)
[facility] Group Notes:  (Clinical Social Work)  08/12/2013   3:00-4:00PM  Summary of Progress/Problems:   The main focus of today's process group was for the patient to identify ways in which they have sabotaged their own mental health wellness/recovery.  Motivational interviewing was used to explore the reasons they engage in this behavior, and reasons they may have for wanting to change.  The Stages of Change were explained to the group using a handout, and patients identified where they are with regard to changing self-defeating behaviors.  The patient expressed that she self-sabotages by being a people pleaser because she hates confrontation, but she always ends up being disappointed.  Type of Therapy:  Process Group  Participation Level:  Active  Participation Quality:  Attentive  Affect:  Blunted and Depressed  Cognitive:  Appropriate  Insight:  Engaged  Engagement in Therapy:  Engaged  Modes of Intervention:  Education, Motivational Interviewing   Ambrose Mantle, LCSW 08/12/2013, 4:00pm

## 2013-08-12 NOTE — Progress Notes (Signed)
D: pt seen sitting in dayroom engaging appropriately with others. Pt stated that today was okay. Pt stated that she attended groups. Pt complains of anxiety and not being able to go to the bathroom. Pt is calm and cooperative. Denies SI/HI/AVH. Denies having any pain. Pt. Rates depression at 5, denies having any hopelessness and helplessness A: Scheduled and prn medications given. q 15 min safety. 1:1 time. Support and encouragement offered.  R: pt. Remains safe on the unit.

## 2013-08-13 MED ORDER — LORAZEPAM 1 MG PO TABS: 1.0000 mg | ORAL_TABLET | ORAL | Status: DC | PRN

## 2013-08-13 MED ORDER — PAROXETINE HCL ER 37.5 MG PO TB24
37.5000 mg | ORAL_TABLET | Freq: Every day | ORAL | Status: DC
  Administered 2013-08-14: 37.5 mg via ORAL
  Filled 2013-08-13 (×3): qty 1

## 2013-08-13 MED ORDER — HYDROXYZINE HCL 10 MG PO TABS
10.0000 mg | ORAL_TABLET | ORAL | Status: DC | PRN
  Administered 2013-08-13 – 2013-08-14 (×3): 10 mg via ORAL
  Filled 2013-08-13 (×3): qty 1

## 2013-08-13 NOTE — Progress Notes (Signed)
Adult Psychoeducational Group Note  Date:  08/13/2013 Time:  08:00pm Group Topic/Focus:  Wrap-Up Group:   The focus of this group is to help patients review their daily goal of treatment and discuss progress on daily workbooks.  Participation Level:  Did Not Attend  Participation Quality:    Affect:    Cognitive:    Insight:   Engagement in Group:   Modes of Intervention:    Additional Comments:  Pt did not attend group.  Shelly Bombard D 08/13/2013, 9:48 PM

## 2013-08-13 NOTE — Progress Notes (Signed)
[facility] MD Progress Note  08/13/2013 3:06 PM Kristy Hays  MRN:  [id number] Subjective:  HPI Comments: Kristy Hays is  a 67 y/o female with a past psychiatric history significant for Major Depression, Recurrent severe.   . Location: The patient is currently inpatient.  The patient reports she is feeling  anxious. . Quality: The patient reports she is eating fairly.  She reports she is doing well on hydroxyzine. She is doing well with the increase of Paxil but continues to have some anxiety-she denies irritability.  Patient denies current suicidal ideation, intent, or plan. Patient denies current homicidal ideation, intent, or plan. Patient denies auditory hallucinations. Patient denies visual hallucinations. Patient denies symptoms of paranoia. Patient states sleep is good. Appetite is fair. Energy level is fair. Patient endorses symptoms of anhedonia. Patient endorses hopelessness, denies helplessness guilt.   . Severity: Moderate . Duration: 7-8 days  . Timing: Worse in the morning, and in the afternoon.  . Context: Anxiety over when anf if she will get better. Friend's death. . Modifying factors: None. . Associated signs and symptoms:  Denies any recent episodes consistent with mania, particularly decreased need for sleep with increased energy, grandiosity, impulsivity, hyperverbal and pressured speech, or increased productivity. Denies any  recent symptoms consistent with psychosis, particularly auditory or visual hallucinations, thought broadcasting/insertion/withdrawal, or ideas of reference. Denies any history of trauma or symptoms consistent with PTSD such as flashbacks, nightmares, hypervigilance, feelings of numbness or inability to connect with others.   Diagnosis:   DSM5: AXIS I: Major Depression, Recurrent severe  AXIS II: Deferred  AXIS III:  Past Medical History   Diagnosis  Date   .  Glaucoma    .  Hypertension    .  Hyperlipidemia    .  Anxiety    .  Depression    AXIS  IV: economic problems, housing problems, other psychosocial or environmental problems, problems related to social environment and problems with primary support group  AXIS V: 41-50 serious symptoms   ADL's:  Intact  Sleep: Negative  Appetite:  Fair  Suicidal Ideation:  Plan:  Patient denies. Intent:  Patient denies. Means:  Patient denies. Homicidal Ideation:  Plan:  Patient denies. Intent:  Patient denies. Means:  Patient denies. AEB (as evidenced by):  Psychiatric Specialty Exam: ROS   Blood pressure 120/81, pulse 81, temperature 98.1 F (36.7 C), temperature source Oral, resp. rate 18, height 5\' 4"  (1.626 m), weight 71.215 kg (157 lb), SpO2 96.00%.Body mass index is 26.94 kg/(m^2).  General Appearance: Casual and Well Groomed  Eye Contact::  Good  Speech:  Clear and Coherent and Normal Rate  Volume:  Normal  Mood:  Anxious  Affect:  Appropriate and Congruent  Thought Process:  Coherent, Linear and Logical  Orientation:  Full (Time, Place, and Person)  Thought Content:  WDL  Suicidal Thoughts:  No  Homicidal Thoughts:  No  Memory:  Immediate;   Good Recent;   Fair Remote;   Poor  Judgement:  Fair  Insight:  Present  Psychomotor Activity:  Normal  Concentration:  Good  Recall:  Good  Akathisia:  No  Handed:  Right  AIMS (if indicated):   As noted in the chart.  Assets:  Communication Skills Desire for Improvement Financial Resources/Insurance Housing Intimacy Leisure Time Physical Health Resilience Social Support Talents/Skills Transportation  Sleep:  Number of Hours: 6.75   Current Medications: Current Facility-Administered Medications  Medication Dose Route Frequency Provider Last Rate Last Dose  . acetaminophen (TYLENOL)  tablet 650 mg  650 mg Oral Q6H PRN Shuvon Rankin, NP   650 mg at 08/07/13 1509  . amLODipine (NORVASC) tablet 5 mg  5 mg Oral Daily Shuvon Rankin, NP   5 mg at 08/13/13 0829  . ARIPiprazole (ABILIFY) tablet 2 mg  2 mg Oral BID  Nehemiah Settle, MD   2 mg at 08/13/13 0829  . atorvastatin (LIPITOR) tablet 40 mg  40 mg Oral QPM Shuvon Rankin, NP   40 mg at 08/12/13 1809  . bismuth subsalicylate (PEPTO BISMOL) chewable tablet 524 mg  524 mg Oral PRN Verne Spurr, PA-C      . busPIRone (BUSPAR) tablet 15 mg  15 mg Oral TID Verne Spurr, PA-C   15 mg at 08/13/13 1254  . carvedilol (COREG) tablet 25 mg  25 mg Oral BID WC Shuvon Rankin, NP   25 mg at 08/13/13 0829  . feeding supplement (ENSURE COMPLETE) (ENSURE COMPLETE) liquid 237 mL  237 mL Oral TID PC Neil Mashburn, PA-C   237 mL at 08/11/13 1410  . gatorade (BH)  240 mL Oral TID [facility], PA-C   240 mL at 08/13/13 0913  . lisinopril (PRINIVIL,ZESTRIL) tablet 20 mg  20 mg Oral Daily Shuvon Rankin, NP   20 mg at 08/13/13 0829   And  . hydrochlorothiazide (HYDRODIURIL) tablet 25 mg  25 mg Oral Daily Shuvon Rankin, NP   25 mg at 08/13/13 0829  . hydrOXYzine (ATARAX/VISTARIL) tablet 25 mg  25 mg Oral Q6H PRN Larena Sox, MD   25 mg at 08/13/13 1256  . latanoprost (XALATAN) 0.005 % ophthalmic solution 1 drop  1 drop Both Eyes QHS Kristeen Mans, NP      . loratadine (CLARITIN) tablet 10 mg  10 mg Oral Daily Shuvon Rankin, NP   10 mg at 08/13/13 0829  . LORazepam (ATIVAN) tablet 1 mg  1 mg Oral Q8H PRN Shuvon Rankin, NP   1 mg at 08/11/13 2044  . magnesium hydroxide (MILK OF MAGNESIA) suspension 30 mL  30 mL Oral Daily PRN Shuvon Rankin, NP      . mirtazapine (REMERON) tablet 7.5 mg  7.5 mg Oral QHS Larena Sox, MD   7.5 mg at 08/12/13 2127  . multivitamin with minerals tablet 1 tablet  1 tablet Oral Daily Shuvon Rankin, NP   1 tablet at 08/13/13 0829  . omega-3 acid ethyl esters (LOVAZA) capsule 1 g  1 g Oral Daily Shuvon Rankin, NP   1 g at 08/13/13 0828  . pantoprazole (PROTONIX) EC tablet 40 mg  40 mg Oral Daily Verne Spurr, PA-C   40 mg at 08/13/13 0347  . PARoxetine (PAXIL-CR) 24 hr tablet 25 mg  25 mg Oral Daily Larena Sox, MD   25 mg at  08/13/13 0829  . senna-docusate (Senokot-S) tablet 1 tablet  1 tablet Oral QHS Larena Sox, MD   1 tablet at 08/12/13 2127  . traZODone (DESYREL) tablet 100 mg  100 mg Oral QHS Shuvon Rankin, NP   100 mg at 08/12/13 2127    Lab Results: No results found for this or any previous visit (from the past 48 hour(s)).  Physical Findings: AIMS: Facial and Oral Movements Muscles of Facial Expression: None, normal Lips and Perioral Area: None, normal Jaw: None, normal Tongue: None, normal,Extremity Movements Upper (arms, wrists, hands, fingers): None, normal Lower (legs, knees, ankles, toes): None, normal, Trunk Movements Neck, shoulders, hips: None, normal, Overall Severity Severity of  abnormal movements (highest score from questions above): None, normal Incapacitation due to abnormal movements: None, normal Patient's awareness of abnormal movements (rate only patient's report): No Awareness, Dental Status Current problems with teeth and/or dentures?: No Does patient usually wear dentures?: No  CIWA:  CIWA-Ar Total: 1 COWS:  COWS Total Score: 4  Treatment Plan Summary: Daily contact with patient to assess and evaluate symptoms and progress in treatment Medication management   Plan:  1. Continue crisis management and stabilization.  2. Anticipate discharge by 08/14/2013. 3. She is instructed to sit on the side of the bed for a few minutes before trying to walk.  Her gait was examined and she is steady with no obvious instability.  4. She is to go to the dining hall for all meals.  5. She is not to remain in bed during the day. She must attend all groups.  6. She can have Gatorade and discontinue the Ensure as she is likely getting hypoglycemic after the Ensure wears off. She is instructed not to have Ensure before meals.  7. Continue mirtazapine 7.5 mg.  Increase Paxil to 37.5 mg.  Continue Hydroxyzine 10 mg Q4 hours Anxiety. Discontinue Ativan.  Medical Decision Making Problem  Points:  Established problem, worsening (2) and Review of psycho-social stressors (1) Data Points:  Order Aims Assessment (2) Review and summation of old records (2) Review of medication regiment & side effects (2) Review of new medications or change in dosage (2)  I certify that inpatient services furnished can reasonably be expected to improve the patient's condition.   Yorel Redder 08/13/2013, 3:06 PM

## 2013-08-13 NOTE — [facility] Group Notes (Signed)
[facility] Group Notes:  (Clinical Social Work)  08/13/2013   1:15-2:20PM  Summary of Progress/Problems:   The main focus of today's process group was to discuss about what constitutes a healthy support versus an unhealthy support, and to generate ideas on increasing supports.  An emphasis was placed on using counselor, doctor, therapy groups, 12-step groups, and problem-specific support groups to expand supports.  Several patients were quite disruptive, turning the subject again and again, so much of group was spent in redirecting.  Type of Therapy:  Process Group  Participation Level:  Active  Participation Quality:  Attentive and Sharing  Affect:  Blunted  Cognitive:  Appropriate and Oriented  Insight:  Engaged  Engagement in Therapy:  Engaged  Modes of Intervention:  Education,  Support and Processing  Zayvien Canning Grossman-Orr, LCSW 08/13/2013, 4:00pm   

## 2013-08-13 NOTE — Progress Notes (Signed)
D) Pt has been attending the groups and interacting with her peers appropriately. Denies SI and HI. Rates her depression at a 6 and her hopelessness at a 0. States that she has a lot of anxiety but the Vistaril is working for her. Affect is appropriate. Mood is depressed and anxious.  A) Pt provided with a 1:1. Brainstormed about anxiety and how to work on it. Given support, reassurance and praise. R) Denies SI and HI.

## 2013-08-13 NOTE — Progress Notes (Signed)
Writer spoke with patient 1:1 and she reports that she is looking forward to discharging on tomorrow. Patient reports that her sister from [place] is coming in to stay with her for a while. Patient currently denies si/hi/a/v hallucinations. Safety maintained on unit with 15 min checks.

## 2013-08-13 NOTE — Progress Notes (Signed)
Psychoeducational Group Note  Date:  08/13/2013 Time:  1015  Group Topic/Focus:  Making Healthy Choices:   The focus of this group is to help patients identify negative/unhealthy choices they were using prior to admission and identify positive/healthier coping strategies to replace them upon discharge.  Participation Level:  Active  Participation Quality:  Appropriate  Affect:  Appropriate  Cognitive:  Oriented  Insight:  Improving  Engagement in Group:  Engaged  Additional Comments:    Natalija Mavis A 08/13/2013  

## 2013-08-13 NOTE — Progress Notes (Signed)
[facility] Group Notes:  (Nursing/MHT/Case Management/Adjunct)  Date:  08/13/2013  Time:  3:50 PM  Type of Therapy:  Psychoeducational Skills  Participation Level:  Active  Participation Quality:  Appropriate  Affect:  Appropriate  Cognitive:  Appropriate  Insight:  Appropriate  Engagement in Group:  Engaged  Modes of Intervention:  Activity  Summary of Progress/Problems:  Kristy Hays 08/13/2013, 3:50 PM

## 2013-08-13 NOTE — Progress Notes (Signed)
[facility] Group Notes:  (Nursing/MHT/Case Management/Adjunct)  Date:  08/12/2013  Time:  8:00 p.m.   Type of Therapy:  Psychoeducational Skills  Participation Level:  Active  Participation Quality:  Appropriate  Affect:  Appropriate  Cognitive:  Appropriate  Insight:  Good  Engagement in Group:  Engaged  Modes of Intervention:  Education  Summary of Progress/Problems: The patient verbalized in group this evening that she had a good day overall. First of all, she indicated that she had a good family visit. Secondly, she indicated that she enjoyed the groups. Finally, she mentioned that she felt better overall. Her goal for tomorrow is to eat more.   Lavaughn Haberle S 08/13/2013, 1:51 AM

## 2013-08-13 NOTE — Progress Notes (Signed)
Psychoeducational Group Note  Date: 08/13/2013 Time:  0930 Group Topic/Focus:  Gratefulness:  The focus of this group is to help patients identify what two things they are most grateful for in their lives. What helps ground them and to center them on their work to their recovery.  Participation Level:  Active  Participation Quality:  Appropriate  Affect:  Appropriate  Cognitive:  Oriented  Insight:  Improving  Engagement in Group:  Engaged  Additional Comments:    Ardis Fullwood A  

## 2013-08-14 ENCOUNTER — Ambulatory Visit: Payer: Self-pay | Admitting: Sports Medicine

## 2013-08-14 ENCOUNTER — Telehealth: Payer: Self-pay | Admitting: *Deleted

## 2013-08-14 MED ORDER — MIRTAZAPINE 7.5 MG PO TABS: 7.5000 mg | ORAL_TABLET | Freq: Every day | ORAL | Status: DC

## 2013-08-14 MED ORDER — TRAZODONE HCL 100 MG PO TABS: 100.0000 mg | ORAL_TABLET | Freq: Every day | ORAL | Status: DC

## 2013-08-14 MED ORDER — ARIPIPRAZOLE 2 MG PO TABS: 2.0000 mg | ORAL_TABLET | Freq: Two times a day (BID) | ORAL | Status: DC

## 2013-08-14 MED ORDER — PAROXETINE HCL ER 37.5 MG PO TB24: 37.5000 mg | ORAL_TABLET | Freq: Every day | ORAL | Status: DC

## 2013-08-14 MED ORDER — ATORVASTATIN CALCIUM 40 MG PO TABS: 40.0000 mg | ORAL_TABLET | Freq: Every day | ORAL | Status: DC

## 2013-08-14 MED ORDER — LISINOPRIL-HYDROCHLOROTHIAZIDE 20-25 MG PO TABS: 1.0000 | ORAL_TABLET | Freq: Every day | ORAL | Status: DC

## 2013-08-14 MED ORDER — CARVEDILOL 25 MG PO TABS: 25.0000 mg | ORAL_TABLET | Freq: Two times a day (BID) | ORAL | Status: DC

## 2013-08-14 MED ORDER — BUSPIRONE HCL 15 MG PO TABS: 15.0000 mg | ORAL_TABLET | Freq: Three times a day (TID) | ORAL | Status: DC

## 2013-08-14 MED ORDER — SENNOSIDES-DOCUSATE SODIUM 8.6-50 MG PO TABS: 1.0000 | ORAL_TABLET | Freq: Every day | ORAL | Status: DC

## 2013-08-14 MED ORDER — AMLODIPINE BESYLATE 10 MG PO TABS: 5.0000 mg | ORAL_TABLET | Freq: Every day | ORAL | Status: DC

## 2013-08-14 NOTE — B[facility] Suicide Risk Assessment (Signed)
Suicide Risk Assessment  Discharge Assessment     Demographic Factors:  Adolescent or young adult, Caucasian, Low socioeconomic status and Unemployed  Mental Status Per Nursing Assessment::   On Admission:     Current Mental Status by Physician: Patient is calm and cooperative. Patient has normal psychomotor activity with the good mood and appropriate affect. Patient has normal speech and thought process. Patient has denied suicide, homicidal ideation intentions and plans. Patient has no evidence of psychotic symptoms.  Loss Factors: NA  Historical Factors: Prior suicide attempts, Family history of mental illness or substance abuse and Impulsivity  Risk Reduction Factors:   Sense of responsibility to family, Religious beliefs about death, Living with another person, especially a relative, Positive social support, Positive therapeutic relationship and Positive coping skills or problem solving skills  Continued Clinical Symptoms:  Depression:   Recent sense of peace/wellbeing Previous Psychiatric Diagnoses and Treatments Medical Diagnoses and Treatments/Surgeries  Cognitive Features That Contribute To Risk:  Polarized thinking    Suicide Risk:  Minimal: No identifiable suicidal ideation.  Patients presenting with no risk factors but with morbid ruminations; may be classified as minimal risk based on the severity of the depressive symptoms  Discharge Diagnoses:   AXIS I:  Major Depression, Recurrent severe AXIS II:  Deferred AXIS III:   Past Medical History  Diagnosis Date  . Glaucoma   . Hypertension   . Hyperlipidemia   . Anxiety   . Depression    AXIS IV:  economic problems, occupational problems, other psychosocial or environmental problems, problems related to social environment and problems with primary support group AXIS V:  61-70 mild symptoms  Plan Of Care/Follow-up recommendations:  Activity:  As tolerated Diet:  Regular  Is patient on multiple  antipsychotic therapies at discharge:  No   Has Patient had three or more failed trials of antipsychotic monotherapy by history:  No  Recommended Plan for Multiple Antipsychotic Therapies: NA  Vickii Volland,JANARDHAHA R. 08/14/2013, 11:07 AM

## 2013-08-14 NOTE — Progress Notes (Signed)
Adult Psychoeducational Group Note  Date:  08/14/2013 Time:  11:00am Group Topic/Focus:  Wellness Toolbox:   The focus of this group is to discuss various aspects of wellness, balancing those aspects and exploring ways to increase the ability to experience wellness.  Patients will create a wellness toolbox for use upon discharge.  Participation Level:  Did Not Attend  Participation Quality:    Affect:    Cognitive:    Insight:   Engagement in Group:    Modes of Intervention:    Additional Comments:  Pt did not attend group  Shelly Bombard D 08/14/2013, 1:28 PM

## 2013-08-14 NOTE — B[facility] Group Notes (Signed)
[facility] LCSW Aftercare Discharge Planning Group Note   08/14/2013 10:39 AM    Participation Quality:  Appropraite  Mood/Affect:  Appropriate  Depression Rating:  4  Anxiety Rating:  8 (due to being ready for discharge)  Thoughts of Suicide:  No  Will you contract for safety?   NA  Current AVH:  No  Plan for Discharge/Comments:  Patient attended discharge planning group and actively participated in group. Patient to follow up with [facility].  CSW provided all participants with daily workbook.   Transportation Means: Patient has transportation.   Supports:  Patient has a support system.   Angeline Trick, Joesph July

## 2013-08-14 NOTE — Discharge Summary (Signed)
Physician Discharge Summary Note  Patient:  Kristy Hays is an 67 y.o., female MRN:  [id number] DOB:  1946/09/08 Patient phone:  [phone] (home)  Patient address:   [address] [place] [address],   Date of Admission:  08/06/2013 Date of Discharge: 08/14/2013  Reason for Admission:  Depression with loss of functioning, suicidal ideations  Discharge Diagnoses: Active Problems:   Major depressive disorder, recurrent severe without psychotic features  Review of Systems  Constitutional: Negative.   HENT: Negative.   Eyes: Negative.   Respiratory: Negative.   Cardiovascular: Negative.   Gastrointestinal: Negative.   Genitourinary: Negative.   Musculoskeletal: Negative.   Skin: Negative.   Neurological: Negative.   Endo/Heme/Allergies: Negative.   Psychiatric/Behavioral: Positive for depression. The patient is nervous/anxious.     DSM5: Depressive Disorders:  Major Depressive Disorder - Severe (296.23)  Axis Diagnosis:   AXIS I:  Anxiety Disorder NOS and Major Depression, Recurrent severe AXIS II:  Deferred AXIS III:   Past Medical History  Diagnosis Date  . Glaucoma   . Hypertension   . Hyperlipidemia   . Anxiety   . Depression    AXIS IV:  other psychosocial or environmental problems, problems related to social environment and problems with primary support group AXIS V:  61-70 mild symptoms  Level of Care:  OP  Hospital Course:  On admission:  Patient admitted voluntarily and emergently from the emergency department for increased symptoms of depression, anxiety and unable to function at her home. Patient also reported she has failed to responding outpatient psychiatric services in [facility] for 3 months. Patient reported she has been compliant with the medication prescribed to her. Patient believes her medication is not helping her or working for her. Reportedly she was placed on Zoloft; buspar and Remeron. Patient was evaluated 3 weeks ago  in [facility] and then provide a prescription for Ativan. she was seen her primary psychiatrist Dr. Laury Deep, who placed her medication including Zoloft. patient does not seem like responding to her current medications assessment. Patient feels that her current medications are making feel worse instead of better. patient has disturbance of appetite and probably weight loss and unable to stay out of her bed and has no interest in her usual activities.I patient reportedly continued to feel depressed and dysphoric and asking for help. She lives alone and is able to perform her ADL's when she was not depressed and states "it's just I don't feel like doing anything." Patient states that she was on a medication 32 years ago for depression and it "worked really well but I don't remember what the name of it was. Patient states that taking Ativan helps some with the anxiety. Patient States that " I just need to feel like my self; I just don't know what else to do." Patient denies psychosis and paranoia.  During hospitalization:  Medications managed--Her blood pressure medications continued along with her Lipitor, her eye medications, vitamins, and GERD medications.  Her Ativan and Zoloft were stopped.  Abilify 2 mg BID for depression, Paxil 37.5 mg daily for depression, Buspar 15 mg TID for anxiety increased from 10 mg TID from her home medications, Senna for constipation daily started.  Her Trazodone 100 mg at bedtime for sleep issues continued.  Her Remeron 30 mg at bedtime decreased to 7.5 mg for sleep and appetite. Kristy Hays attended and participated in therapy.  She denied suicidal/homicidal ideations and auditory/visual hallucinations, follow-up appointments encouraged to attend, outside support groups  encouraged and information given, Rx given.  Kristy Hays is mentally and physically stable for discharge.  Consults:  None  Significant Diagnostic Studies:  labs: completed, reviewed, stable  Discharge  Vitals:   Blood pressure 156/92, pulse 80, temperature 98.1 F (36.7 C), temperature source Oral, resp. rate 16, height 5\' 4"  (1.626 m), weight 71.215 kg (157 lb), SpO2 96.00%. Body mass index is 26.94 kg/(m^2). Lab Results:   No results found for this or any previous visit (from the past 72 hour(s)).  Physical Findings: AIMS: Facial and Oral Movements Muscles of Facial Expression: None, normal Lips and Perioral Area: None, normal Jaw: None, normal Tongue: None, normal,Extremity Movements Upper (arms, wrists, hands, fingers): None, normal Lower (legs, knees, ankles, toes): None, normal, Trunk Movements Neck, shoulders, hips: None, normal, Overall Severity Severity of abnormal movements (highest score from questions above): None, normal Incapacitation due to abnormal movements: None, normal Patient's awareness of abnormal movements (rate only patient's report): No Awareness, Dental Status Current problems with teeth and/or dentures?: No Does patient usually wear dentures?: No  CIWA:  CIWA-Ar Total: 2 COWS:  COWS Total Score: 1  Psychiatric Specialty Exam: See Psychiatric Specialty Exam and Suicide Risk Assessment completed by Attending Physician prior to discharge.  Discharge destination:  Home  Is patient on multiple antipsychotic therapies at discharge:  No   Has Patient had three or more failed trials of antipsychotic monotherapy by history:  No  Recommended Plan for Multiple Antipsychotic Therapies: NA  Discharge Orders   Future Appointments Provider Department Dept Phone   09/21/2013 11:30 AM Geanie Berlin, LCSW [facility] [place] [phone]   Future Orders Complete By Expires   Activity as tolerated - No restrictions  As directed    Diet - low sodium heart healthy  As directed        Medication List    STOP taking these medications       fish oil-omega-3 fatty acids 1000 MG capsule     lisinopril 20 MG tablet  Commonly known as:   PRINIVIL,ZESTRIL     LORazepam 1 MG tablet  Commonly known as:  ATIVAN     sertraline 25 MG tablet  Commonly known as:  ZOLOFT     Travoprost (BAK Free) 0.004 % Soln ophthalmic solution  Commonly known as:  TRAVATAN      TAKE these medications     Indication   amLODipine 10 MG tablet  Commonly known as:  NORVASC  Take 0.5 tablets (5 mg total) by mouth daily.   Indication:  High Blood Pressure     ARIPiprazole 2 MG tablet  Commonly known as:  ABILIFY  Take 1 tablet (2 mg total) by mouth 2 (two) times daily.   Indication:  Major Depressive Disorder     atorvastatin 40 MG tablet  Commonly known as:  LIPITOR  Take 1 tablet (40 mg total) by mouth daily.   Indication:  hyperlipidemia     busPIRone 15 MG tablet  Commonly known as:  BUSPAR  Take 1 tablet (15 mg total) by mouth 3 (three) times daily.   Indication:  Anxiety Disorder     carvedilol 25 MG tablet  Commonly known as:  COREG  Take 1 tablet (25 mg total) by mouth 2 (two) times daily with a meal.   Indication:  High Blood Pressure of Unknown Cause     latanoprost 0.005 % ophthalmic solution  Commonly known as:  XALATAN  Place 1 drop into both eyes at bedtime.  Indication:  Wide-Angle Glaucoma     lisinopril-hydrochlorothiazide 20-25 MG per tablet  Commonly known as:  PRINZIDE,ZESTORETIC  Take 1 tablet by mouth daily.   Indication:  High Blood Pressure     loratadine 10 MG tablet  Commonly known as:  CLARITIN  Take 1 tablet (10 mg total) by mouth daily.   Indication:  Hayfever     mirtazapine 7.5 MG tablet  Commonly known as:  REMERON  Take 1 tablet (7.5 mg total) by mouth at bedtime.   Indication:  Trouble Sleeping, Major Depressive Disorder     multivitamin with minerals Tabs tablet  Take 1 tablet by mouth daily.   Indication:  vitamin supplement     omega-3 acid ethyl esters 1 G capsule  Commonly known as:  LOVAZA  Take 1 capsule (1 g total) by mouth daily.   Indication:  High Amount of  Triglycerides in the Blood     pantoprazole 40 MG tablet  Commonly known as:  PROTONIX  Take 1 tablet (40 mg total) by mouth daily.   Indication:  GERD     PARoxetine 37.5 MG 24 hr tablet  Commonly known as:  PAXIL-CR  Take 1 tablet (37.5 mg total) by mouth daily.   Indication:  Major Depressive Disorder     senna-docusate 8.6-50 MG per tablet  Commonly known as:  Senokot-S  Take 1 tablet by mouth at bedtime.   Indication:  Constipation     traZODone 100 MG tablet  Commonly known as:  DESYREL  Take 1 tablet (100 mg total) by mouth at bedtime.   Indication:  Trouble Sleeping           Follow-up Information   Follow up with Dr. Tanda Rockers - [facility] On 08/22/2013. (Tuesday, August 22, 2013 at 10:00 AM)    Contact information:   [place], [place]  [phone]      Follow up with Orvan July - [facility] On 08/21/2013. Orvan July, Monday, September 21, 2013 at 11:30 AM)    Contact information:   [address] [place], [place]   [address]  [phone]      Follow-up recommendations:  Activity:  as tolerated Diet:  low-sodium heart healthy diet  Comments:  Patient will continue his care at [facility] outpatient.  Total Discharge Time:  Greater than 30 minutes.  SignedNanine Means, PMH-NP 08/14/2013, 1:46 PM  Patient was seen for psychiatric assessment, suicide risk assessment and case discussed with the treatment team and formulated treatment plan and disposition plan.Reviewed the information documented and agree with the treatment plan.  Bradin Mcadory,JANARDHAHA R. 08/14/2013 5:25 PM

## 2013-08-14 NOTE — Telephone Encounter (Signed)
appt cancled

## 2013-08-14 NOTE — Progress Notes (Signed)
D:  Patient's self inventory sheet, patient has poor sleep, improving appetite, low energy level, good attention span.  Rated depression 4, anxiety 8, denied hopeless.  Denied withdrawals.  Denied SI.  Has experienced headache in past 24 hours.  Worst pain #3.  After discharge, plans to walk the neighborhood and tr;y to stay busy, eat healthy.  Does have discharge plans.  No problems taking meds after discharge. A:  Medications administered per MD orders.  Emotional support and encouragement given patient. R:  Denied SI and HI.  Denied A/V hallucinations.  Denied pain.  Will continue to monitor patient for safety with 15 minute checks.  Safety maintained.

## 2013-08-14 NOTE — Progress Notes (Signed)
[facility] Adult Case Management Discharge Plan :  Will you be returning to the same living situation after discharge: Yes,  Patient will return to her home. At discharge, do you have transportation home?:Yes,  Daughter to transport patient home. Do you have the ability to pay for your medications:Yes,  Patient is able to afford medications.  Release of information consent forms completed and in the chart;  Patient's signature needed at discharge.  Patient to Follow up at: Follow-up Information   Follow up with Dr. Tanda Rockers - [facility] On 08/22/2013. (Tuesday, August 22, 2013 at 10:00 AM)    Contact information:   [place], [place]  [phone]      Follow up with Orvan July - [facility] On 08/21/2013. Orvan July, Monday, September 21, 2013 at 11:30 AM)    Contact information:   [address] [place], [place]   [address]  [phone]      Patient denies SI/HI:   Patient no longer endorsing SI/HI or other thoughts of self harm.   Safety Planning and Suicide Prevention discussed:  .Reviewed with all patients during discharge planning group  Macklyn Glandon, Joesph July 08/14/2013, 10:38 AM

## 2013-08-14 NOTE — Tx Team (Signed)
Interdisciplinary Treatment Plan Update   Date Reviewed:  08/14/2013  Time Reviewed:  8:38 AM  Progress in Treatment:   Attending groups: Yes Participating in groups: Yes Taking medication as prescribed: Yes  Tolerating medication: Yes Family/Significant other contact made: Yes, contact with daughter Patient understands diagnosis: Yes  Discussing patient identified problems/goals with staff: Yes Medical problems stabilized or resolved: Yes Denies suicidal/homicidal ideation: Yes Patient has not harmed self or others: Yes  For review of initial/current patient goals, please see plan of care.  Estimated Length of Stay:  Discharge today  Reasons for Continued Hospitalization:   New Problems/Goals identified:    Discharge Plan or Barriers:   Home with outpatient follow up [facility]  Additional Comments:     Attendees:  Patient:  Kristy Hays 08/14/2013 8:38 AM   Signature: Mervyn Yomira, MD 08/14/2013 8:38 AM  Signature:  Herma Mering, NP  08/14/2013 8:38 AM  Signature:   Neill Loft, RN 08/14/2013 8:38 AM  Signature:  Quintella Reichert, RN 08/14/2013 8:38 AM  Signature:  08/14/2013 8:38 AM  Signature:  Juline Patch, LCSW 08/14/2013 8:38 AM  Signature:  Elizbeth Squires, Team Lead 08/14/2013 8:38 AM  Signature:  Conard Novak Coordinator 08/14/2013 8:38 AM  Signature:   08/14/2013 8:38 AM  Signature:  08/14/2013  8:38 AM  Signature:   Onnie Boer, RN [facility] 08/14/2013  8:38 AM  Signature:   08/14/2013  8:38 AM    Scribe for Treatment Team:   Juline Patch,  08/14/2013 8:38 AM

## 2013-08-14 NOTE — Progress Notes (Signed)
Discharge Note:  Patient discharged home with daughter.  Denied SI and HI.  Denied A/V hallucinations.  Denied pain.  Suicide prevention information given and discussed with patient who stated she understood and had no questions.  Patient received all her belongings, miscellaneous items, toiletries, clothing, prescriptions, conditioner, bag.  Patient stated she appreciated all assistance received from [facility] staff.

## 2013-08-16 ENCOUNTER — Telehealth (HOSPITAL_COMMUNITY): Payer: Self-pay

## 2013-08-17 NOTE — Progress Notes (Signed)
Patient Discharge Instructions:  Next Level Care Provider Has Access to the EMR, 08/17/13 Records provided to [facility] via [facility]/Epic access.  Jerelene Redden, 08/17/2013, 1:53 PM

## 2013-08-18 NOTE — Telephone Encounter (Signed)
Called patient. She still states she feels groggy. Will decrease of trazodone to 50-75 mg.  If this does not improve drowsiness will increase mirtazapine to 15 mg.

## 2013-08-21 ENCOUNTER — Telehealth (HOSPITAL_COMMUNITY): Payer: Self-pay

## 2013-08-21 NOTE — Telephone Encounter (Signed)
Called patient. She reports she is feeling less foggy with the lower dose of Abilify to 1 mg.  Will continue at this dose for one week.

## 2013-08-22 ENCOUNTER — Encounter (HOSPITAL_COMMUNITY): Payer: Self-pay | Admitting: Psychiatry

## 2013-08-22 ENCOUNTER — Ambulatory Visit (INDEPENDENT_AMBULATORY_CARE_PROVIDER_SITE_OTHER): Payer: Medicare Other | Admitting: Psychiatry

## 2013-08-22 VITALS — BP 128/73 | HR 62 | Ht 64.0 in | Wt 160.0 lb

## 2013-08-22 DIAGNOSIS — F332 Major depressive disorder, recurrent severe without psychotic features: Secondary | ICD-10-CM

## 2013-08-22 MED ORDER — PAROXETINE HCL 20 MG PO TABS: 40.0000 mg | ORAL_TABLET | Freq: Two times a day (BID) | ORAL | Status: DC

## 2013-08-22 MED ORDER — BUSPIRONE HCL 15 MG PO TABS: 15.0000 mg | ORAL_TABLET | Freq: Three times a day (TID) | ORAL | Status: DC

## 2013-08-22 MED ORDER — MIRTAZAPINE 7.5 MG PO TABS: 7.5000 mg | ORAL_TABLET | Freq: Every day | ORAL | Status: DC

## 2013-08-22 MED ORDER — TRAZODONE HCL 50 MG PO TABS: ORAL_TABLET | ORAL | Status: DC

## 2013-08-22 MED ORDER — ARIPIPRAZOLE 2 MG PO TABS: ORAL_TABLET | ORAL | Status: DC
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
## 2013-08-28 ENCOUNTER — Telehealth (HOSPITAL_COMMUNITY): Payer: Self-pay

## 2013-08-28 NOTE — Addendum Note (Signed)
Addended by: Larena Sox on: 08/28/2013 02:21 AM   Modules accepted: Level of Service

## 2013-08-30 NOTE — Telephone Encounter (Signed)
Acknowledged.

## 2013-09-21 ENCOUNTER — Encounter (HOSPITAL_COMMUNITY): Payer: Self-pay | Admitting: Licensed Clinical Social Worker

## 2013-09-21 ENCOUNTER — Ambulatory Visit (INDEPENDENT_AMBULATORY_CARE_PROVIDER_SITE_OTHER): Payer: Medicare Other | Admitting: Licensed Clinical Social Worker

## 2013-09-21 DIAGNOSIS — F4323 Adjustment disorder with mixed anxiety and depressed mood: Secondary | ICD-10-CM
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
## 2013-09-28 ENCOUNTER — Encounter (HOSPITAL_COMMUNITY): Payer: Self-pay | Admitting: Psychiatry

## 2013-09-28 ENCOUNTER — Ambulatory Visit (INDEPENDENT_AMBULATORY_CARE_PROVIDER_SITE_OTHER): Payer: Medicare Other | Admitting: Psychiatry

## 2013-09-28 VITALS — BP 153/74 | HR 61 | Wt 162.0 lb

## 2013-09-28 DIAGNOSIS — F332 Major depressive disorder, recurrent severe without psychotic features: Secondary | ICD-10-CM

## 2013-09-28 MED ORDER — BUSPIRONE HCL 15 MG PO TABS: 15.0000 mg | ORAL_TABLET | Freq: Two times a day (BID) | ORAL | Status: DC

## 2013-09-28 MED ORDER — PAROXETINE HCL 20 MG PO TABS: 20.0000 mg | ORAL_TABLET | Freq: Two times a day (BID) | ORAL | Status: DC
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
## 2013-09-29 NOTE — Addendum Note (Signed)
Addended by: Larena SoxPUTHUVEL, Destenee Guerry J on: 09/29/2013 08:56 PM   Modules accepted: Level of Service

## 2013-09-29 NOTE — Addendum Note (Signed)
Addended by: Marcelo Ickes J on: 09/29/2013 08:56 PM   Modules accepted: Level of Service  

## 2013-11-27 ENCOUNTER — Ambulatory Visit (INDEPENDENT_AMBULATORY_CARE_PROVIDER_SITE_OTHER): Payer: Medicare Other | Admitting: Psychiatry

## 2013-11-27 ENCOUNTER — Encounter (HOSPITAL_COMMUNITY): Payer: Self-pay | Admitting: Psychiatry

## 2013-11-27 VITALS — BP 129/78 | HR 73 | Wt 163.0 lb

## 2013-11-27 DIAGNOSIS — F332 Major depressive disorder, recurrent severe without psychotic features: Secondary | ICD-10-CM

## 2013-11-27 MED ORDER — PAROXETINE HCL 20 MG PO TABS: 20.0000 mg | ORAL_TABLET | Freq: Two times a day (BID) | ORAL | Status: DC

## 2013-11-27 MED ORDER — BUSPIRONE HCL 15 MG PO TABS: 15.0000 mg | ORAL_TABLET | Freq: Two times a day (BID) | ORAL | Status: DC
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
## 2013-12-01 ENCOUNTER — Encounter (HOSPITAL_COMMUNITY): Payer: Self-pay | Admitting: Psychiatry

## 2014-01-07 ENCOUNTER — Other Ambulatory Visit (HOSPITAL_COMMUNITY): Payer: Self-pay | Admitting: Psychiatry

## 2014-05-23 DIAGNOSIS — F410 Panic disorder [episodic paroxysmal anxiety] without agoraphobia: Secondary | ICD-10-CM | POA: Insufficient documentation

## 2014-06-27 ENCOUNTER — Other Ambulatory Visit: Payer: Self-pay | Admitting: Sports Medicine

## 2014-07-11 ENCOUNTER — Other Ambulatory Visit: Payer: Self-pay | Admitting: Sports Medicine

## 2014-07-18 ENCOUNTER — Other Ambulatory Visit: Payer: Self-pay | Admitting: Sports Medicine

## 2014-08-14 ENCOUNTER — Ambulatory Visit (INDEPENDENT_AMBULATORY_CARE_PROVIDER_SITE_OTHER): Payer: Medicare Other | Admitting: Sports Medicine

## 2014-08-14 ENCOUNTER — Encounter: Payer: Self-pay | Admitting: Sports Medicine

## 2014-08-14 ENCOUNTER — Ambulatory Visit (INDEPENDENT_AMBULATORY_CARE_PROVIDER_SITE_OTHER): Payer: Medicare Other

## 2014-08-14 VITALS — BP 93/53 | HR 77 | Ht 64.0 in | Wt 174.0 lb

## 2014-08-14 DIAGNOSIS — R05 Cough: Secondary | ICD-10-CM

## 2014-08-14 DIAGNOSIS — Z0000 Encounter for general adult medical examination without abnormal findings: Secondary | ICD-10-CM

## 2014-08-14 DIAGNOSIS — H6123 Impacted cerumen, bilateral: Secondary | ICD-10-CM

## 2014-08-14 DIAGNOSIS — J209 Acute bronchitis, unspecified: Secondary | ICD-10-CM

## 2014-08-14 DIAGNOSIS — I10 Essential (primary) hypertension: Secondary | ICD-10-CM

## 2014-08-14 DIAGNOSIS — F332 Major depressive disorder, recurrent severe without psychotic features: Secondary | ICD-10-CM

## 2014-08-14 DIAGNOSIS — H6120 Impacted cerumen, unspecified ear: Secondary | ICD-10-CM | POA: Insufficient documentation

## 2014-08-14 DIAGNOSIS — E785 Hyperlipidemia, unspecified: Secondary | ICD-10-CM

## 2014-08-14 LAB — CBC
HCT: 37.7 % (ref 36.0–46.0)
Hemoglobin: 13 g/dL (ref 12.0–15.0)
MCH: 30.7 pg (ref 26.0–34.0)
MCHC: 34.5 g/dL (ref 30.0–36.0)
MCV: 88.9 fL (ref 78.0–100.0)
MPV: 9.9 fL (ref 9.4–12.4)
Platelets: 266 10*3/uL (ref 150–400)
RBC: 4.24 MIL/uL (ref 3.87–5.11)
RDW: 13.2 % (ref 11.5–15.5)
WBC: 6.1 10*3/uL (ref 4.0–10.5)

## 2014-08-14 LAB — LIPID PANEL
Cholesterol: 142 mg/dL (ref 0–200)
HDL: 62 mg/dL (ref >39)
LDL Cholesterol: 63 mg/dL (ref 0–99)
Total CHOL/HDL Ratio: 2.3 ratio
Triglycerides: 86 mg/dL (ref <150)
VLDL: 17 mg/dL (ref 0–40)

## 2014-08-14 LAB — COMPREHENSIVE METABOLIC PANEL
ALT: 22 U/L (ref 0–35)
AST: 24 U/L (ref 0–37)
Albumin: 4.2 g/dL (ref 3.5–5.2)
Alkaline Phosphatase: 65 U/L (ref 39–117)
BUN: 18 mg/dL (ref 6–23)
Calcium: 9 mg/dL (ref 8.4–10.5)
Chloride: 100 mEq/L (ref 96–112)
Creat: 1.62 mg/dL — ABNORMAL HIGH (ref 0.50–1.10)
Potassium: 3.9 mEq/L (ref 3.5–5.3)
Sodium: 140 mEq/L (ref 135–145)
Total Protein: 6.7 g/dL (ref 6.0–8.3)

## 2014-08-14 LAB — COMPREHENSIVE METABOLIC PANEL WITH GFR
CO2: 27 meq/L (ref 19–32)
Glucose, Bld: 163 mg/dL — ABNORMAL HIGH (ref 70–99)
Total Bilirubin: 0.6 mg/dL (ref 0.2–1.2)

## 2014-08-14 MED ORDER — ATORVASTATIN CALCIUM 40 MG PO TABS: 40.0000 mg | ORAL_TABLET | Freq: Every day | ORAL | Status: DC

## 2014-08-14 MED ORDER — CARVEDILOL 25 MG PO TABS: 25.0000 mg | ORAL_TABLET | Freq: Two times a day (BID) | ORAL | Status: DC

## 2014-08-14 MED ORDER — BUSPIRONE HCL 15 MG PO TABS: 15.0000 mg | ORAL_TABLET | Freq: Two times a day (BID) | ORAL | Status: DC

## 2014-08-14 MED ORDER — PAROXETINE HCL 20 MG PO TABS: 20.0000 mg | ORAL_TABLET | Freq: Two times a day (BID) | ORAL | Status: DC

## 2014-08-14 MED ORDER — AMLODIPINE BESYLATE 5 MG PO TABS: 5.0000 mg | ORAL_TABLET | Freq: Every day | ORAL | Status: DC

## 2014-08-14 MED ORDER — BENZONATATE 200 MG PO CAPS: 200.0000 mg | ORAL_CAPSULE | Freq: Three times a day (TID) | ORAL | Status: DC | PRN

## 2014-08-14 MED ORDER — LISINOPRIL-HYDROCHLOROTHIAZIDE 20-25 MG PO TABS: 1.0000 | ORAL_TABLET | Freq: Every day | ORAL | Status: DC

## 2014-08-14 NOTE — Assessment & Plan Note (Signed)
Removal by physician with curette and with irrigation.

## 2014-08-14 NOTE — Assessment & Plan Note (Signed)
Controlled, rechecking blood work.

## 2014-08-14 NOTE — Progress Notes (Addendum)
 Subjective:    Kristy Hays is a 68 y.o. female who presents for Medicare Annual/Subsequent preventive examination.  Preventive Screening-Counseling & Management  Tobacco History  Smoking status  . Former Smoker  Smokeless tobacco  . Not on file    Comment: Quit 24 years ago,     Problems Prior to Visit Hypertension, cough.  Current Problems (verified) Patient Active Problem List   Diagnosis Date Noted  . Major depressive disorder, recurrent severe without psychotic features 08/06/2013  . Hyperlipidemia 07/07/2012  . Hypertension 07/05/2012  . Skin tag, face 07/05/2012    Medications Prior to Visit Current Outpatient Prescriptions on File Prior to Visit  Medication Sig Dispense Refill  . amLODipine (NORVASC) 10 MG tablet Take 0.5 tablets (5 mg total) by mouth daily. 30 tablet 0  . atorvastatin (LIPITOR) 40 MG tablet TAKE ONE TABLET BY MOUTH ONCE DAILY 90 tablet 0  . busPIRone (BUSPAR) 15 MG tablet Take 1 tablet (15 mg total) by mouth 2 (two) times daily. 180 tablet 1  . carvedilol (COREG) 25 MG tablet Take 1 tablet by mouth twice daily with a meal. ** Appointment necessary for refills ** 60 tablet 0  . lisinopril (PRINIVIL,ZESTRIL) 20 MG tablet TAKE ONE TABLET BY MOUTH ONCE DAILY **APPOINTMENT  NEEDED  FOR  REFILLS** 15 tablet 0  . lisinopril-hydrochlorothiazide (PRINZIDE,ZESTORETIC) 20-25 MG per tablet Take 0.5 tablets by mouth daily.    Marland Kitchen loratadine (CLARITIN) 10 MG tablet Take 1 tablet (10 mg total) by mouth daily.    . Multiple Vitamin (MULTIVITAMIN WITH MINERALS) TABS tablet Take 1 tablet by mouth daily.    Marland Kitchen omega-3 acid ethyl esters (LOVAZA) 1 G capsule Take 1 capsule (1 g total) by mouth daily. 30 capsule 0  . PARoxetine (PAXIL) 20 MG tablet Take 1 tablet (20 mg total) by mouth every 12 (twelve) hours. 180 tablet 1  . TRAVATAN Z 0.004 % SOLN ophthalmic solution      No current facility-administered medications on file prior to visit.    Current Medications  (verified) Current Outpatient Prescriptions  Medication Sig Dispense Refill  . amLODipine (NORVASC) 10 MG tablet Take 0.5 tablets (5 mg total) by mouth daily. 30 tablet 0  . atorvastatin (LIPITOR) 40 MG tablet TAKE ONE TABLET BY MOUTH ONCE DAILY 90 tablet 0  . busPIRone (BUSPAR) 15 MG tablet Take 1 tablet (15 mg total) by mouth 2 (two) times daily. 180 tablet 1  . carvedilol (COREG) 25 MG tablet Take 1 tablet by mouth twice daily with a meal. ** Appointment necessary for refills ** 60 tablet 0  . lisinopril (PRINIVIL,ZESTRIL) 20 MG tablet TAKE ONE TABLET BY MOUTH ONCE DAILY **APPOINTMENT  NEEDED  FOR  REFILLS** 15 tablet 0  . lisinopril-hydrochlorothiazide (PRINZIDE,ZESTORETIC) 20-25 MG per tablet Take 0.5 tablets by mouth daily.    Marland Kitchen loratadine (CLARITIN) 10 MG tablet Take 1 tablet (10 mg total) by mouth daily.    . Multiple Vitamin (MULTIVITAMIN WITH MINERALS) TABS tablet Take 1 tablet by mouth daily.    Marland Kitchen omega-3 acid ethyl esters (LOVAZA) 1 G capsule Take 1 capsule (1 g total) by mouth daily. 30 capsule 0  . PARoxetine (PAXIL) 20 MG tablet Take 1 tablet (20 mg total) by mouth every 12 (twelve) hours. 180 tablet 1  . TRAVATAN Z 0.004 % SOLN ophthalmic solution      No current facility-administered medications for this visit.     Allergies (verified) Antihistamines, chlorpheniramine-type; Benadryl; and Penicillins   PAST HISTORY  Family History  Family History  Problem Relation Age of Onset  . Cancer Neg Hx   . Heart attack Mother   . Hypertension Mother   . Hypertension Sister     Social History History  Substance Use Topics  . Smoking status: Former Games developermoker  . Smokeless tobacco: Not on file     Comment: Quit 24 years ago,  . Alcohol Use: No     Comment: Very seldom-5 times a year     Are there smokers in your home (other than you)? No  Risk Factors Current exercise habits: Walks 3 miles daily 4 days per week. Dietary issues discussed: no   Cardiac risk factors:  advanced age (older than 7655 for men, 4365 for women) and hypertension.  Depression Screen (Note: if answer to either of the following is "Yes", a more complete depression screening is indicated)   Over the past two weeks, have you felt down, depressed or hopeless? No  Over the past two weeks, have you felt little interest or pleasure in doing things? No  Have you lost interest or pleasure in daily life? No  Do you often feel hopeless? No  Do you cry easily over simple problems? No  Activities of Daily Living In your present state of health, do you have any difficulty performing the following activities?:  Driving? No Managing money?  No Feeding yourself? No Getting from bed to chair? NoNo exam performed today, able to get up fine. Climbing a flight of stairs? No Preparing food and eating?: No Bathing or showering? No Getting dressed: No Getting to the toilet? No Using the toilet:No Moving around from place to place: No In the past year have you fallen or had a near fall?:No   Are you sexually active?  No  Do you have more than one partner?  No  Hearing Difficulties: No Do you often ask people to speak up or repeat themselves? No Do you experience ringing or noises in your ears? No Do you have difficulty understanding soft or whispered voices? No   Do you feel that you have a problem with memory? No  Do you often misplace items? No  Do you feel safe at home?  Yes  Cognitive Testing  Alert? Yes  Normal Appearance?Yes  Oriented to person? Yes  Place? Yes   Time? Yes  Recall of three objects?  Yes  Can perform simple calculations? Yes  Displays appropriate judgment?Yes  Can read the correct time from a watch face?Yes   Advanced Directives have been discussed with the patient? No  List the Names of Other Physician/Practitioners you currently use: 1.    Indicate any recent Medical Services you may have received from other than [facility] providers in the past year (date may be  approximate).  Immunization History  Administered Date(s) Administered  . Influenza Split 05/15/2012  . Pneumococcal Polysaccharide-23 08/09/2012  . Tdap 09/14/1998, 08/09/2012  . Zoster 08/09/2012    Screening Tests Health Maintenance  Topic Date Due  . COLONOSCOPY  03/26/1995  . INFLUENZA VACCINE  04/14/2014  . MAMMOGRAM  06/30/2015  . TETANUS/TDAP  08/09/2022  . PNEUMOCOCCAL POLYSACCHARIDE VACCINE AGE 74 AND OVER  Completed  . ZOSTAVAX  Completed    All answers were reviewed with the patient and necessary referrals were made:  Rodney LangtonHEKKEKANDAM, , MD   08/14/2014   History reviewed: allergies, current medications, past family history, past medical history, past social history, past surgical history and problem list  Review of Systems A comprehensive review of  systems was negative.    Objective:     Vision by Snellen chart: right eye:20/20, left eye:20/20  Body mass index is 29.85 kg/(m^2). BP 93/53 mmHg  Pulse 77  Ht 5\' 4"  (1.626 m)  Wt 174 lb (78.926 kg)  BMI 29.85 kg/m2 General: Well Developed, well nourished, and in no acute distress.  Neuro: Alert and oriented x3, extra-ocular muscles intact, sensation grossly intact. Cranial nerves II through XII are intact, motor, sensory, and coordinative functions are all intact. HEENT: Normocephalic, atraumatic, pupils equal round reactive to light, neck supple, no masses, no lymphadenopathy, thyroid nonpalpable. Oropharynx, nasopharynx unremarkable, bilateral cerumen impaction. Skin: Warm and dry, no rashes noted.  Cardiac: Regular rate and rhythm, no murmurs rubs or gallops.  Respiratory: Clear to auscultation bilaterally. Not using accessory muscles, speaking in full sentences.  Abdominal: Soft, nontender, nondistended, positive bowel sounds, no masses, no organomegaly.  Musculoskeletal: Shoulder, elbow, wrist, hip, knee, ankle stable, and with full range of motion.   Indication: Cerumen impaction of the  ear(s) Medical necessity statement: On physical examination, cerumen impairs clinically significant portions of the external auditory canal, and tympanic membrane. Noted obstructive, copious cerumen that cannot be removed without magnification and instrumentations requiring physician skills Consent: Discussed benefits and risks of procedure and verbal consent obtained Procedure: Patient was prepped for the procedure. Utilized an otoscope to assess and take note of the ear canal, the tympanic membrane, and the presence, amount, and placement of the cerumen. Gentle water irrigation and soft plastic curette was utilized to remove cerumen.  Post procedure examination: shows cerumen was completely removed. Patient tolerated procedure well. The patient is made aware that they may experience temporary vertigo, temporary hearing loss, and temporary discomfort. If these symptom last for more than 24 hours to call the clinic or proceed to the ED. Assessment:     Healthy female     Plan:     During the course of the visit the patient was educated and counseled about appropriate screening and preventive services including:    Colorectal cancer screening  Diet review for nutrition referral? Yes ____  Not Indicated ____   Patient Instructions (the written plan) was given to the patient.  Medicare Attestation I have personally reviewed: The patient's medical and social history Their use of alcohol, tobacco or illicit drugs Their current medications and supplements The patient's functional ability including ADLs,fall risks, home safety risks, cognitive, and hearing and visual impairment Diet and physical activities Evidence for depression or mood disorders  The patient's weight, height, BMI, and visual acuity have been recorded in the chart.  I have made referrals, counseling, and provided education to the patient based on review of the above and I have provided the patient with a written  personalized care plan for preventive services.     Rodney LangtonHEKKEKANDAM, , MD   08/14/2014       I spent 40 minutes with this patient, greater than 50% was face-to-face time and counseling regarding the below diagnoses.

## 2014-08-14 NOTE — Assessment & Plan Note (Signed)
Well-controlled, refilling medication. Continue follow-up with psychiatry.

## 2014-08-14 NOTE — Assessment & Plan Note (Signed)
Blood pressure is well-controlled. Removing lisinopril 20. Refilling all other medications.

## 2014-08-14 NOTE — Assessment & Plan Note (Signed)
Chest x-ray, Tessalon Perles.

## 2014-08-15 ENCOUNTER — Encounter: Payer: Self-pay | Admitting: Sports Medicine

## 2014-08-15 DIAGNOSIS — N289 Disorder of kidney and ureter, unspecified: Secondary | ICD-10-CM | POA: Insufficient documentation

## 2014-11-13 ENCOUNTER — Ambulatory Visit: Payer: Self-pay | Admitting: Sports Medicine

## 2015-08-10 ENCOUNTER — Other Ambulatory Visit: Payer: Self-pay | Admitting: Sports Medicine

## 2015-08-19 ENCOUNTER — Encounter: Payer: Self-pay | Admitting: Sports Medicine

## 2015-08-19 ENCOUNTER — Ambulatory Visit (INDEPENDENT_AMBULATORY_CARE_PROVIDER_SITE_OTHER): Payer: Medicare Other

## 2015-08-19 ENCOUNTER — Ambulatory Visit (INDEPENDENT_AMBULATORY_CARE_PROVIDER_SITE_OTHER): Payer: Medicare Other | Admitting: Sports Medicine

## 2015-08-19 VITALS — BP 155/91 | HR 61 | Wt 169.0 lb

## 2015-08-19 DIAGNOSIS — M25461 Effusion, right knee: Secondary | ICD-10-CM | POA: Diagnosis not present

## 2015-08-19 DIAGNOSIS — Z0000 Encounter for general adult medical examination without abnormal findings: Secondary | ICD-10-CM | POA: Diagnosis not present

## 2015-08-19 DIAGNOSIS — M25561 Pain in right knee: Secondary | ICD-10-CM | POA: Diagnosis not present

## 2015-08-19 DIAGNOSIS — E785 Hyperlipidemia, unspecified: Secondary | ICD-10-CM

## 2015-08-19 DIAGNOSIS — I10 Essential (primary) hypertension: Secondary | ICD-10-CM | POA: Diagnosis not present

## 2015-08-19 DIAGNOSIS — R739 Hyperglycemia, unspecified: Secondary | ICD-10-CM

## 2015-08-19 DIAGNOSIS — M1711 Unilateral primary osteoarthritis, right knee: Secondary | ICD-10-CM | POA: Insufficient documentation

## 2015-08-19 DIAGNOSIS — M25462 Effusion, left knee: Secondary | ICD-10-CM

## 2015-08-19 DIAGNOSIS — Z23 Encounter for immunization: Secondary | ICD-10-CM

## 2015-08-19 DIAGNOSIS — F332 Major depressive disorder, recurrent severe without psychotic features: Secondary | ICD-10-CM

## 2015-08-19 LAB — LIPID PANEL
Cholesterol: 179 mg/dL (ref 125–200)
HDL: 73 mg/dL (ref >=46)
LDL Cholesterol: 85 mg/dL (ref <130)
Total CHOL/HDL Ratio: 2.5 ratio (ref <=5.0)
Triglycerides: 103 mg/dL (ref <150)
VLDL: 21 mg/dL (ref <30)

## 2015-08-19 LAB — COMPREHENSIVE METABOLIC PANEL
ALT: 16 U/L (ref 6–29)
Albumin: 4.6 g/dL (ref 3.6–5.1)
Alkaline Phosphatase: 83 U/L (ref 33–130)
BUN: 18 mg/dL (ref 7–25)
Chloride: 104 mmol/L (ref 98–110)
Creat: 0.88 mg/dL (ref 0.60–0.93)
Glucose, Bld: 108 mg/dL — ABNORMAL HIGH (ref 65–99)
Potassium: 4.6 mmol/L (ref 3.5–5.3)
Sodium: 142 mmol/L (ref 135–146)
Total Protein: 7 g/dL (ref 6.1–8.1)

## 2015-08-19 LAB — COMPREHENSIVE METABOLIC PANEL WITH GFR
AST: 19 U/L (ref 10–35)
CO2: 30 mmol/L (ref 20–31)
Calcium: 9.8 mg/dL (ref 8.6–10.4)
Total Bilirubin: 0.5 mg/dL (ref 0.2–1.2)

## 2015-08-19 LAB — HEMOGLOBIN A1C
Hgb A1c MFr Bld: 6.1 % — ABNORMAL HIGH (ref <5.7)
Mean Plasma Glucose: 128 mg/dL — ABNORMAL HIGH (ref <117)

## 2015-08-19 LAB — CBC
HCT: 40.2 % (ref 36.0–46.0)
Hemoglobin: 13.3 g/dL (ref 12.0–15.0)
MCH: 29.9 pg (ref 26.0–34.0)
MCHC: 33.1 g/dL (ref 30.0–36.0)
MCV: 90.3 fL (ref 78.0–100.0)
MPV: 9.7 fL (ref 8.6–12.4)
Platelets: 332 K/uL (ref 150–400)
RBC: 4.45 MIL/uL (ref 3.87–5.11)
RDW: 13 % (ref 11.5–15.5)
WBC: 6 10*3/uL (ref 4.0–10.5)

## 2015-08-19 MED ORDER — AMLODIPINE BESYLATE 5 MG PO TABS: 5.0000 mg | ORAL_TABLET | Freq: Every day | ORAL | Status: DC

## 2015-08-19 MED ORDER — LISINOPRIL-HYDROCHLOROTHIAZIDE 20-25 MG PO TABS: 1.0000 | ORAL_TABLET | Freq: Every day | ORAL | Status: DC

## 2015-08-19 MED ORDER — OMEGA-3-ACID ETHYL ESTERS 1 G PO CAPS: 1.0000 g | ORAL_CAPSULE | Freq: Every day | ORAL | Status: DC

## 2015-08-19 MED ORDER — CARVEDILOL 25 MG PO TABS: 25.0000 mg | ORAL_TABLET | Freq: Two times a day (BID) | ORAL | Status: DC

## 2015-08-19 MED ORDER — MELOXICAM 15 MG PO TABS: ORAL_TABLET | ORAL | Status: DC

## 2015-08-19 MED ORDER — ATORVASTATIN CALCIUM 40 MG PO TABS: 40.0000 mg | ORAL_TABLET | Freq: Every day | ORAL | Status: DC

## 2015-08-19 NOTE — Assessment & Plan Note (Signed)
Medicare physical as above, catching up on Immunizations, Mammogram. Bone Density Test.

## 2015-08-19 NOTE — Assessment & Plan Note (Signed)
Elevated, will recheck at visit for knee f/u.

## 2015-08-19 NOTE — Progress Notes (Signed)
Subjective:    Kristy Hays is a 69 y.o. female who presents for Medicare Annual/Subsequent preventive examination.  Preventive Screening-Counseling & Management  Tobacco History  Smoking status  . Former Smoker  Smokeless tobacco  . Not on file    Comment: Quit 24 years ago,     Problems Prior to Visit 1. Hypertension: Elevated, no headaches, visual changes, chest pain 2. Hyperlipidemia: Stable on Lipitor. 3. Major depression: Managed by psychiatry, stable. 4. Right knee pain, medial joint line, moderate, persistent, present for several weeks, no swelling, no mechanical symptoms.  Current Problems (verified) Patient Active Problem List   Diagnosis Date Noted  . Renal insufficiency 08/15/2014  . Acute bronchitis 08/14/2014  . Cerumen impaction 08/14/2014  . Major depressive disorder, recurrent severe without psychotic features (HCC) 08/06/2013  . Hyperlipidemia 07/07/2012  . Hypertension 07/05/2012    Medications Prior to Visit Current Outpatient Prescriptions on File Prior to Visit  Medication Sig Dispense Refill  . amLODipine (NORVASC) 5 MG tablet TAKE ONE TABLET BY MOUTH ONCE DAILY 90 tablet 0  . atorvastatin (LIPITOR) 40 MG tablet Take 1 tablet (40 mg total) by mouth daily. 90 tablet 3  . busPIRone (BUSPAR) 15 MG tablet Take 1 tablet (15 mg total) by mouth 2 (two) times daily. 180 tablet 1  . carvedilol (COREG) 25 MG tablet Take 1 tablet (25 mg total) by mouth 2 (two) times daily with a meal. 180 tablet 3  . lisinopril-hydrochlorothiazide (PRINZIDE,ZESTORETIC) 20-25 MG per tablet Take 1 tablet by mouth daily. 90 tablet 3  . loratadine (CLARITIN) 10 MG tablet Take 1 tablet (10 mg total) by mouth daily.    . Multiple Vitamin (MULTIVITAMIN WITH MINERALS) TABS tablet Take 1 tablet by mouth daily.    Marland Kitchen. omega-3 acid ethyl esters (LOVAZA) 1 G capsule Take 1 capsule (1 g total) by mouth daily. 30 capsule 0  . PARoxetine (PAXIL) 20 MG tablet Take 1 tablet (20 mg total) by mouth  every 12 (twelve) hours. 180 tablet 1  . TRAVATAN Z 0.004 % SOLN ophthalmic solution      No current facility-administered medications on file prior to visit.    Current Medications (verified) Current Outpatient Prescriptions  Medication Sig Dispense Refill  . amLODipine (NORVASC) 5 MG tablet TAKE ONE TABLET BY MOUTH ONCE DAILY 90 tablet 0  . atorvastatin (LIPITOR) 40 MG tablet Take 1 tablet (40 mg total) by mouth daily. 90 tablet 3  . busPIRone (BUSPAR) 15 MG tablet Take 1 tablet (15 mg total) by mouth 2 (two) times daily. 180 tablet 1  . carvedilol (COREG) 25 MG tablet Take 1 tablet (25 mg total) by mouth 2 (two) times daily with a meal. 180 tablet 3  . lisinopril-hydrochlorothiazide (PRINZIDE,ZESTORETIC) 20-25 MG per tablet Take 1 tablet by mouth daily. 90 tablet 3  . loratadine (CLARITIN) 10 MG tablet Take 1 tablet (10 mg total) by mouth daily.    . Multiple Vitamin (MULTIVITAMIN WITH MINERALS) TABS tablet Take 1 tablet by mouth daily.    Marland Kitchen. omega-3 acid ethyl esters (LOVAZA) 1 G capsule Take 1 capsule (1 g total) by mouth daily. 30 capsule 0  . PARoxetine (PAXIL) 20 MG tablet Take 1 tablet (20 mg total) by mouth every 12 (twelve) hours. 180 tablet 1  . TRAVATAN Z 0.004 % SOLN ophthalmic solution      No current facility-administered medications for this visit.     Allergies (verified) Antihistamines, chlorpheniramine-type; Benadryl; and Penicillins   PAST HISTORY  Family History Family  History  Problem Relation Age of Onset  . Cancer Neg Hx   . Heart attack Mother   . Hypertension Mother   . Hypertension Sister     Social History Social History  Substance Use Topics  . Smoking status: Former Games developer  . Smokeless tobacco: Not on file     Comment: Quit 24 years ago,  . Alcohol Use: No     Comment: Very seldom-5 times a year     Are there smokers in your home (other than you)? No  Risk Factors Current exercise habits: Walking 2 miles daily Dietary issues discussed:  No   Cardiac risk factors: advanced age (older than 45 for men, 20 for women), dyslipidemia and hypertension.  Depression Screen (Note: if answer to either of the following is "Yes", a more complete depression screening is indicated)   Over the past two weeks, have you felt down, depressed or hopeless? No  Over the past two weeks, have you felt little interest or pleasure in doing things? No  Have you lost interest or pleasure in daily life? No  Do you often feel hopeless? No  Do you cry easily over simple problems? No  Activities of Daily Living In your present state of health, do you have any difficulty performing the following activities?:  Driving? No Managing money?  No Feeding yourself? No Getting from bed to chair? NoNo exam performed today, not needed. Climbing a flight of stairs? No Preparing food and eating?: No Bathing or showering? No Getting dressed: No Getting to the toilet? No Using the toilet:No Moving around from place to place: No In the past year have you fallen or had a near fall?:Yes   Are you sexually active?  No  Do you have more than one partner?  No  Hearing Difficulties: No Do you often ask people to speak up or repeat themselves? No Do you experience ringing or noises in your ears? No Do you have difficulty understanding soft or whispered voices? No   Do you feel that you have a problem with memory? No  Do you often misplace items? No  Do you feel safe at home?  Yes  Cognitive Testing  Alert? Yes  Normal Appearance?Yes  Oriented to person? Yes  Place? Yes   Time? Yes  Recall of three objects?  Yes  Can perform simple calculations? Yes  Displays appropriate judgment?Yes  Can read the correct time from a watch face?Yes   Advanced Directives have been discussed with the patient? Yes  List the Names of Other Physician/Practitioners you currently use: 1.    Indicate any recent Medical Services you may have received from other than [facility]  providers in the past year (date may be approximate).  Immunization History  Administered Date(s) Administered  . Influenza Split 05/15/2012  . Pneumococcal Polysaccharide-23 08/09/2012  . Tdap 09/14/1998, 08/09/2012  . Zoster 08/09/2012    Screening Tests Health Maintenance  Topic Date Due  . Hepatitis C Screening  1946/09/08  . COLONOSCOPY  03/26/1995  . DEXA SCAN  03/25/2010  . PNA vac Low Risk Adult (2 of 2 - PCV13) 08/09/2013  . INFLUENZA VACCINE  04/15/2015  . MAMMOGRAM  06/30/2015  . TETANUS/TDAP  08/09/2022  . ZOSTAVAX  Completed    All answers were reviewed with the patient and necessary referrals were made:  Rodney Langton, MD   08/19/2015   History reviewed: allergies, current medications, past family history, past medical history, past social history, past surgical history and problem  list  Review of Systems A comprehensive review of systems was negative.    Objective:     Body mass index is 28.99 kg/(m^2). BP 155/91 mmHg  Pulse 61  Wt 169 lb (76.658 kg) General: Well Developed, well nourished, and in no acute distress.  Neuro: Alert and oriented x3, extra-ocular muscles intact, sensation grossly intact. Cranial nerves II through XII are intact, motor, sensory, and coordinative functions are all intact. HEENT: Normocephalic, atraumatic, pupils equal round reactive to light, neck supple, no masses, no lymphadenopathy, thyroid nonpalpable. Oropharynx, nasopharynx, external ear canals are unremarkable. Skin: Warm and dry, no rashes noted.  Cardiac: Regular rate and rhythm, no murmurs rubs or gallops.  Respiratory: Clear to auscultation bilaterally. Not using accessory muscles, speaking in full sentences.  Abdominal: Soft, nontender, nondistended, positive bowel sounds, no masses, no organomegaly.  Right Knee: Normal to inspection with no erythema or effusion or obvious bony abnormalities. Tender to palpation at the medial joint line ROM normal in flexion  and extension and lower leg rotation. Ligaments with solid consistent endpoints including ACL, PCL, LCL, MCL. Negative Mcmurray's and provocative meniscal tests. Non painful patellar compression. Patellar and quadriceps tendons unremarkable. Hamstring and quadriceps strength is normal.   Assessment:     Healthy female     Plan:     During the course of the visit the patient was educated and counseled about appropriate screening and preventive services including:    Pneumococcal vaccine   Influenza vaccine  Screening mammography  Bone densitometry screening  Colorectal cancer screening  Diet review for nutrition referral? Yes ____  Not Indicated ____   Patient Instructions (the written plan) was given to the patient.  Medicare Attestation I have personally reviewed: The patient's medical and social history Their use of alcohol, tobacco or illicit drugs Their current medications and supplements The patient's functional ability including ADLs,fall risks, home safety risks, cognitive, and hearing and visual impairment Diet and physical activities Evidence for depression or mood disorders  The patient's weight, height, BMI, and visual acuity have been recorded in the chart.  I have made referrals, counseling, and provided education to the patient based on review of the above and I have provided the patient with a written personalized care plan for preventive services.     Rodney Langton, MD   08/19/2015

## 2015-08-19 NOTE — Assessment & Plan Note (Signed)
Mobic, xrays.

## 2015-08-19 NOTE — Assessment & Plan Note (Signed)
Recheck lipids

## 2015-08-22 ENCOUNTER — Telehealth: Payer: Self-pay | Admitting: Sports Medicine

## 2015-08-22 NOTE — Telephone Encounter (Signed)
Received fax for prior authorization on Ethyl Esters capsule sent through cover my meds waiting on authorization. - CF

## 2015-08-28 ENCOUNTER — Ambulatory Visit (INDEPENDENT_AMBULATORY_CARE_PROVIDER_SITE_OTHER): Payer: Medicare Other

## 2015-08-28 DIAGNOSIS — Z1231 Encounter for screening mammogram for malignant neoplasm of breast: Secondary | ICD-10-CM

## 2015-08-28 DIAGNOSIS — M8580 Other specified disorders of bone density and structure, unspecified site: Secondary | ICD-10-CM

## 2015-08-28 NOTE — Telephone Encounter (Signed)
[facility] and [facility] denied [facility] on Ethyl Esters Cap due to being a non formulary drug. - CF

## 2015-08-29 ENCOUNTER — Other Ambulatory Visit: Payer: Self-pay | Admitting: Sports Medicine

## 2015-09-17 ENCOUNTER — Ambulatory Visit (INDEPENDENT_AMBULATORY_CARE_PROVIDER_SITE_OTHER): Payer: Medicare Other | Admitting: Sports Medicine

## 2015-09-17 VITALS — BP 150/82 | HR 68 | Temp 98.1°F | Resp 16 | Wt 167.7 lb

## 2015-09-17 DIAGNOSIS — M1711 Unilateral primary osteoarthritis, right knee: Secondary | ICD-10-CM

## 2015-09-17 DIAGNOSIS — Z0000 Encounter for general adult medical examination without abnormal findings: Secondary | ICD-10-CM | POA: Diagnosis not present

## 2015-09-17 DIAGNOSIS — I10 Essential (primary) hypertension: Secondary | ICD-10-CM

## 2015-09-17 MED ORDER — AMLODIPINE BESYLATE 10 MG PO TABS: 10.0000 mg | ORAL_TABLET | Freq: Every day | ORAL | Status: DC

## 2015-09-17 NOTE — Assessment & Plan Note (Signed)
Pain is essentially resolved with meloxicam.

## 2015-09-17 NOTE — Progress Notes (Signed)
  Subjective:    CC: Follow-up  HPI: Right knee osteoarthritis: Improved significantly with meloxicam  Hypertension: Still elevated, on 5 mg of amlodipine, and a couple of other medicines. No lows, no presyncope. No chest pain, visual changes, headache.  Past medical history, Surgical history, Family history not pertinant except as noted below, Social history, Allergies, and medications have been entered into the medical record, reviewed, and no changes needed.   Review of Systems: No fevers, chills, night sweats, weight loss, chest pain, or shortness of breath.   Objective:    General: Well Developed, well nourished, and in no acute distress.  Neuro: Alert and oriented x3, extra-ocular muscles intact, sensation grossly intact.  HEENT: Normocephalic, atraumatic, pupils equal round reactive to light, neck supple, no masses, no lymphadenopathy, thyroid nonpalpable.  Skin: Warm and dry, no rashes. Cardiac: Regular rate and rhythm, no murmurs rubs or gallops, no lower extremity edema.  Respiratory: Clear to auscultation bilaterally. Not using accessory muscles, speaking in full sentences.  Impression and Recommendations:

## 2015-09-17 NOTE — Assessment & Plan Note (Signed)
Up-to-date on screening measures, colonoscopy will be at the end of this month.

## 2015-09-17 NOTE — Assessment & Plan Note (Signed)
Still elevated, rechecking, and increasing amlodipine to 10 mg. Continue carvedilol and lisinopril/HCTZ.

## 2015-10-11 ENCOUNTER — Encounter: Payer: Self-pay | Admitting: Sports Medicine

## 2015-10-11 LAB — HM COLONOSCOPY: HM Colonoscopy: 4

## 2015-10-15 ENCOUNTER — Ambulatory Visit (INDEPENDENT_AMBULATORY_CARE_PROVIDER_SITE_OTHER): Payer: Medicare Other | Admitting: Sports Medicine

## 2015-10-15 VITALS — BP 126/82 | HR 65 | Resp 18 | Wt 168.2 lb

## 2015-10-15 DIAGNOSIS — I10 Essential (primary) hypertension: Secondary | ICD-10-CM | POA: Diagnosis not present

## 2015-10-15 DIAGNOSIS — F332 Major depressive disorder, recurrent severe without psychotic features: Secondary | ICD-10-CM | POA: Diagnosis not present

## 2015-10-15 MED ORDER — BUSPIRONE HCL 15 MG PO TABS: 15.0000 mg | ORAL_TABLET | Freq: Every day | ORAL | Status: DC

## 2015-10-15 NOTE — Progress Notes (Signed)
  Subjective:    CC: Follow-up   HPI: Hypertension: Improved significantly with increasing amlodipine to 10 mg. No side effects.  Past medical history, Surgical history, Family history not pertinant except as noted below, Social history, Allergies, and medications have been entered into the medical record, reviewed, and no changes needed.   Review of Systems: No fevers, chills, night sweats, weight loss, chest pain, or shortness of breath.   Objective:    General: Well Developed, well nourished, and in no acute distress.  Neuro: Alert and oriented x3, extra-ocular muscles intact, sensation grossly intact.  HEENT: Normocephalic, atraumatic, pupils equal round reactive to light, neck supple, no masses, no lymphadenopathy, thyroid nonpalpable.  Skin: Warm and dry, no rashes. Cardiac: Regular rate and rhythm, no murmurs rubs or gallops, no lower extremity edema.  Respiratory: Clear to auscultation bilaterally. Not using accessory muscles, speaking in full sentences.  Impression and Recommendations:

## 2015-10-15 NOTE — Assessment & Plan Note (Signed)
Controlled now, no changes, return in one year.

## 2015-11-14 ENCOUNTER — Telehealth: Payer: Self-pay | Admitting: *Deleted

## 2015-11-14 NOTE — Telephone Encounter (Signed)
Pt left vm stating that however the A1c was coded during her physical in 10-Oct-2023 was wrong and Medicare is refusing to pay it.  She has already received one bill.  She wants to know if a different code can be submitted.

## 2015-11-14 NOTE — Telephone Encounter (Signed)
I have changed the association of the hemoglobin A1c to hyperglycemia, go ahead and resubmit.

## 2015-12-17 NOTE — Telephone Encounter (Signed)
Per Haskell RilingWendy Blum:  [name] [name]. Diagnosis code: R73.9 was added.

## 2016-08-17 ENCOUNTER — Other Ambulatory Visit: Payer: Self-pay | Admitting: Sports Medicine

## 2016-10-11 ENCOUNTER — Other Ambulatory Visit: Payer: Self-pay | Admitting: Sports Medicine

## 2016-10-11 DIAGNOSIS — I10 Essential (primary) hypertension: Secondary | ICD-10-CM

## 2016-10-11 DIAGNOSIS — F332 Major depressive disorder, recurrent severe without psychotic features: Secondary | ICD-10-CM

## 2016-11-24 ENCOUNTER — Ambulatory Visit (INDEPENDENT_AMBULATORY_CARE_PROVIDER_SITE_OTHER): Payer: Medicare Other | Admitting: Sports Medicine

## 2016-11-24 ENCOUNTER — Encounter: Payer: Self-pay | Admitting: Sports Medicine

## 2016-11-24 DIAGNOSIS — I10 Essential (primary) hypertension: Secondary | ICD-10-CM | POA: Diagnosis not present

## 2016-11-24 DIAGNOSIS — E7800 Pure hypercholesterolemia, unspecified: Secondary | ICD-10-CM

## 2016-11-24 DIAGNOSIS — Z0000 Encounter for general adult medical examination without abnormal findings: Secondary | ICD-10-CM

## 2016-11-24 DIAGNOSIS — E785 Hyperlipidemia, unspecified: Secondary | ICD-10-CM | POA: Diagnosis not present

## 2016-11-24 LAB — CBC
HCT: 41.2 % (ref 35.0–45.0)
Hemoglobin: 13.5 g/dL (ref 11.7–15.5)
MCH: 29.8 pg (ref 27.0–33.0)
MCHC: 32.8 g/dL (ref 32.0–36.0)
MCV: 90.9 fL (ref 80.0–100.0)
MPV: 10.2 fL (ref 7.5–12.5)
Platelets: 289 K/uL (ref 140–400)
RBC: 4.53 MIL/uL (ref 3.80–5.10)
RDW: 13.5 % (ref 11.0–15.0)
WBC: 4.9 K/uL (ref 3.8–10.8)

## 2016-11-24 LAB — COMPREHENSIVE METABOLIC PANEL
ALT: 14 U/L (ref 6–29)
AST: 16 U/L (ref 10–35)
Albumin: 4.3 g/dL (ref 3.6–5.1)
Chloride: 106 mmol/L (ref 98–110)
Creat: 1.01 mg/dL — ABNORMAL HIGH (ref 0.60–0.93)
Sodium: 142 mmol/L (ref 135–146)
Total Bilirubin: 0.5 mg/dL (ref 0.2–1.2)
Total Protein: 6.7 g/dL (ref 6.1–8.1)

## 2016-11-24 LAB — COMPREHENSIVE METABOLIC PANEL WITH GFR
Alkaline Phosphatase: 83 U/L (ref 33–130)
BUN: 22 mg/dL (ref 7–25)
CO2: 26 mmol/L (ref 20–31)
Calcium: 9.2 mg/dL (ref 8.6–10.4)
Glucose, Bld: 108 mg/dL — ABNORMAL HIGH (ref 65–99)
Potassium: 4.4 mmol/L (ref 3.5–5.3)

## 2016-11-24 LAB — LIPID PANEL W/REFLEX DIRECT LDL
Cholesterol: 168 mg/dL (ref <200)
HDL: 64 mg/dL (ref >50)
LDL-Cholesterol: 85 mg/dL
Non-HDL Cholesterol (Calc): 104 mg/dL (ref <130)
Total CHOL/HDL Ratio: 2.6 ratio (ref <5.0)
Triglycerides: 92 mg/dL (ref <150)

## 2016-11-24 LAB — TSH: TSH: 2.4 m[IU]/L

## 2016-11-24 MED ORDER — ATORVASTATIN CALCIUM 20 MG PO TABS: 20.0000 mg | ORAL_TABLET | Freq: Every day | ORAL | 3 refills | Status: DC

## 2016-11-24 MED ORDER — CARVEDILOL 25 MG PO TABS: 25.0000 mg | ORAL_TABLET | Freq: Two times a day (BID) | ORAL | 3 refills | Status: DC

## 2016-11-24 NOTE — Progress Notes (Signed)
  Subjective:    CC: Follow-up  HPI: Hypertension: Well controlled.  Hyperlipidemia: Well controlled, needs recheck on lipids, thinks the 40 mg atorvastatin is causing some aching in the legs.  Preventive measures: Due for blood work, hepatitis C screening, agrees to return for a Medicare physical  Past medical history:  Negative.  See flowsheet/record as well for more information.  Surgical history: Negative.  See flowsheet/record as well for more information.  Family history: Negative.  See flowsheet/record as well for more information.  Social history: Negative.  See flowsheet/record as well for more information.  Allergies, and medications have been entered into the medical record, reviewed, and no changes needed.   Review of Systems: No fevers, chills, night sweats, weight loss, chest pain, or shortness of breath.   Objective:    General: Well Developed, well nourished, and in no acute distress.  Neuro: Alert and oriented x3, extra-ocular muscles intact, sensation grossly intact.  HEENT: Normocephalic, atraumatic, pupils equal round reactive to light, neck supple, no masses, no lymphadenopathy, thyroid nonpalpable.  Skin: Warm and dry, no rashes. Cardiac: Regular rate and rhythm, no murmurs rubs or gallops, no lower extremity edema.  Respiratory: Clear to auscultation bilaterally. Not using accessory muscles, speaking in full sentences.  Impression and Recommendations:    Annual physical exam Patient will return for Medicare physical  Hyperlipidemia Getting some leg pain on 40 mg of atorvastatin, decreasing to 20 mg.  Hypertension Well-controlled, no changes in dose.  I spent 25 minutes with this patient, greater than 50% was face-to-face time counseling regarding the above diagnoses

## 2016-11-24 NOTE — Assessment & Plan Note (Signed)
Well-controlled, no changes in dose.

## 2016-11-24 NOTE — Assessment & Plan Note (Signed)
Getting some leg pain on 40 mg of atorvastatin, decreasing to 20 mg.

## 2016-11-24 NOTE — Assessment & Plan Note (Signed)
Patient will return for Medicare physical.  

## 2016-11-25 LAB — HEPATITIS C ANTIBODY: HCV Ab: NEGATIVE

## 2016-11-25 LAB — HEMOGLOBIN A1C
Hgb A1c MFr Bld: 5.8 % — ABNORMAL HIGH (ref <5.7)
Mean Plasma Glucose: 120 mg/dL

## 2016-11-25 LAB — VITAMIN D 25 HYDROXY (VIT D DEFICIENCY, FRACTURES): Vit D, 25-Hydroxy: 42 ng/mL (ref 30–100)

## 2016-12-11 ENCOUNTER — Ambulatory Visit: Payer: Self-pay

## 2016-12-14 ENCOUNTER — Telehealth: Payer: Self-pay | Admitting: Sports Medicine

## 2016-12-14 NOTE — Telephone Encounter (Signed)
Letecia called into the office today and ask me to call Kristy Hays because her Hep C panel was not covered.  I called into [facility] and added Z11.59, screening for other viral disease.

## 2016-12-15 ENCOUNTER — Ambulatory Visit (INDEPENDENT_AMBULATORY_CARE_PROVIDER_SITE_OTHER): Payer: Medicare Other | Admitting: Sports Medicine

## 2016-12-15 ENCOUNTER — Encounter: Payer: Self-pay | Admitting: Sports Medicine

## 2016-12-15 VITALS — BP 120/68 | HR 66 | Resp 16 | Ht 64.0 in | Wt 179.9 lb

## 2016-12-15 DIAGNOSIS — Z0000 Encounter for general adult medical examination without abnormal findings: Secondary | ICD-10-CM

## 2016-12-15 NOTE — Progress Notes (Signed)
Subjective:   Kristy Hays is a 71 y.o. female who presents for Medicare Annual (Subsequent) preventive examination.  Review of Systems:  A comprehensive review of systems was negative.    Objective:   Vitals: BP 120/68   Pulse 66   Resp 16   Ht  (1.626 m)   Wt 179 lb 14.4 oz (81.6 kg)   BMI 30.88 kg/m   Body mass index is 30.88 kg/m.  General: Well Developed, well nourished, and in no acute distress.  Neuro: Alert and oriented x3, extra-ocular muscles intact, sensation grossly intact. Cranial nerves II through XII are intact, motor, sensory, and coordinative functions are all intact. HEENT: Normocephalic, atraumatic, pupils equal round reactive to light, neck supple, no masses, no lymphadenopathy, thyroid nonpalpable. Oropharynx, nasopharynx, external ear canals are unremarkable. Skin: Warm and dry, no rashes noted.  Cardiac: Regular rate and rhythm, no murmurs rubs or gallops.  Respiratory: Clear to auscultation bilaterally. Not using accessory muscles, speaking in full sentences.  Abdominal: Soft, nontender, nondistended, positive bowel sounds, no masses, no organomegaly.  Musculoskeletal: Shoulder, elbow, wrist, hip, knee, ankle stable, and with full range of motion.  Tobacco History  Smoking Status  . Former Smoker  Smokeless Tobacco  . Never Used    Comment: Quit 24 years ago,     Counseling given: Not Answered   Past Medical History:  Diagnosis Date  . Anxiety   . Depression   . Glaucoma   . Hyperlipidemia   . Hypertension    Past Surgical History:  Procedure Laterality Date  . ABDOMINAL HYSTERECTOMY     Family History  Problem Relation Age of Onset  . Cancer Neg Hx   . Heart attack Mother   . Hypertension Mother   . Hypertension Sister    History  Sexual Activity  . Sexual activity: Not Currently  . Partners: Male    Outpatient Encounter Prescriptions as of 12/15/2016  Medication Sig  . amLODipine (NORVASC) 10 MG tablet TAKE ONE TABLET  BY MOUTH ONCE DAILY  . atorvastatin (LIPITOR) 20 MG tablet Take 1 tablet (20 mg total) by mouth daily.  . carvedilol (COREG) 25 MG tablet Take 1 tablet (25 mg total) by mouth 2 (two) times daily with a meal.  . lisinopril-hydrochlorothiazide (PRINZIDE,ZESTORETIC) 20-25 MG tablet TAKE ONE TABLET BY MOUTH ONCE DAILY  . loratadine (CLARITIN) 10 MG tablet Take 1 tablet (10 mg total) by mouth daily.  . meloxicam (MOBIC) 15 MG tablet One tab PO qAM with breakfast for 2 weeks, then daily prn pain.  . Multiple Vitamin (MULTIVITAMIN WITH MINERALS) TABS tablet Take 1 tablet by mouth daily.  Marland Kitchen PARoxetine (PAXIL) 20 MG tablet Take 1 tablet (20 mg total) by mouth every 12 (twelve) hours.  Floyde Parkins Z 0.004 % SOLN ophthalmic solution    No facility-administered encounter medications on file as of 12/15/2016.     Activities of Daily Living No flowsheet data found.  Patient Care Team: Monica Becton, MD as PCP - General (Family Medicine)    Assessment:    Healthy female Exercise Activities and Dietary recommendations    Goals    None     Fall Risk Fall Risk  08/14/2014  Falls in the past year? No  Risk for fall due to : History of fall(s)   Depression Screen PHQ 2/9 Scores 11/24/2016 08/14/2014  PHQ - 2 Score 0 0     Cognitive Function Normal.  Immunization History  Administered Date(s) Administered  .  Influenza Split 05/15/2012  . Pneumococcal Conjugate-13 08/19/2015  . Pneumococcal Polysaccharide-23 08/09/2012  . Tdap 09/14/1998, 08/09/2012  . Zoster 08/09/2012   Screening Tests Health Maintenance  Topic Date Due  . INFLUENZA VACCINE  04/14/2017  . MAMMOGRAM  08/27/2017  . TETANUS/TDAP  08/09/2022  . COLONOSCOPY  10/10/2025  . DEXA SCAN  Completed  . Hepatitis C Screening  Completed  . PNA vac Low Risk Adult  Completed     Plan:     Return in one year. Up-to-date on screening measures.  During the course of the visit the patient was educated and counseled about  the following appropriate screening and preventive services:   Vaccines to include Pneumoccal, Influenza, Hepatitis B, Td, Zostavax, HCV  Electrocardiogram  Cardiovascular Disease  Colorectal cancer screening  Bone density screening  Diabetes screening  Glaucoma screening  Mammography/PAP  Nutrition counseling   Patient Instructions (the written plan) was given to the patient.   Rodney Langton, MD  12/15/2016

## 2016-12-15 NOTE — Progress Notes (Signed)
  Subjective:    CC: complete physical  HPI:  Kristy Hays is a 71 y.o. Female with a history of HTN, hyperlipidemia, glaucoma, depression, and anxiety who presents today for annual physical. She says her medical problems are all well-controlled, and denies any changes in medication. Her last A1c was 5.8 three weeks ago which is still in the pre-diabetes range, but is trending down from 6.1 a year ago. Pt advised to continue healthy diet and exercise habits. She does not have any other pressing medical concerns.   Past medical history:  HTN, hyperlipidemia, glaucoma, depression, and anxiety.  See flowsheet/record as well for more information.  Surgical history: Abdominal hysterectomy.  See flowsheet/record as well for more information.  Family history: Negative.  See flowsheet/record as well for more information.  Social history: Negative.  See flowsheet/record as well for more information.  Allergies, and medications have been entered into the medical record, reviewed, and no changes needed.    Review of Systems: No headache, visual changes, nausea, vomiting, diarrhea, constipation, dizziness, abdominal pain, skin rash, fevers, chills, night sweats, swollen lymph nodes, weight loss, chest pain, body aches, joint swelling, muscle aches, shortness of breath, mood changes, visual or auditory hallucinations.  Objective:    General: Well Developed, well nourished, and in no acute distress.  Neuro: Alert and oriented x3, extra-ocular muscles intact, sensation grossly intact.  HEENT: Normocephalic, atraumatic, pupils equal round reactive to light, neck supple, no masses, no lymphadenopathy, thyroid nonpalpable.  Skin: Warm and dry, no rashes noted.  Cardiac: Regular rate and rhythm, no murmurs rubs or gallops.  Respiratory: Clear to auscultation bilaterally. Not using accessory muscles, speaking in full sentences.  Abdominal: Soft, nontender, nondistended, positive bowel sounds, no masses, no  organomegaly.  Musculoskeletal: Shoulder, elbow, wrist, hip, knee, ankle stable, and with full range of motion.    Impression and Recommendations:    The patient was counselled, risk factors were discussed, anticipatory guidance given.  [name] is a healthy 71 y.o. Female with h/o HTN, hyperlipidemia, glaucoma, depression, and anxiety who presents today for annual physical. Comprehensive physical and ROS was negative. Continue current regimen. Return for annual physical in one year.

## 2017-04-09 ENCOUNTER — Other Ambulatory Visit: Payer: Self-pay

## 2017-10-04 ENCOUNTER — Other Ambulatory Visit: Payer: Self-pay | Admitting: Sports Medicine

## 2017-10-04 DIAGNOSIS — F332 Major depressive disorder, recurrent severe without psychotic features: Secondary | ICD-10-CM

## 2017-10-04 DIAGNOSIS — I10 Essential (primary) hypertension: Secondary | ICD-10-CM

## 2017-11-22 ENCOUNTER — Other Ambulatory Visit: Payer: Self-pay | Admitting: Sports Medicine

## 2017-11-22 DIAGNOSIS — E7800 Pure hypercholesterolemia, unspecified: Secondary | ICD-10-CM

## 2017-12-21 ENCOUNTER — Other Ambulatory Visit: Payer: Self-pay | Admitting: Sports Medicine

## 2017-12-21 ENCOUNTER — Encounter: Payer: Self-pay | Admitting: Sports Medicine

## 2017-12-21 ENCOUNTER — Ambulatory Visit (INDEPENDENT_AMBULATORY_CARE_PROVIDER_SITE_OTHER): Payer: Medicare Other | Admitting: Sports Medicine

## 2017-12-21 VITALS — BP 120/70 | HR 58 | Resp 18 | Wt 182.0 lb

## 2017-12-21 DIAGNOSIS — Z0000 Encounter for general adult medical examination without abnormal findings: Secondary | ICD-10-CM | POA: Diagnosis not present

## 2017-12-21 DIAGNOSIS — E7800 Pure hypercholesterolemia, unspecified: Secondary | ICD-10-CM

## 2017-12-21 DIAGNOSIS — Z1239 Encounter for other screening for malignant neoplasm of breast: Secondary | ICD-10-CM

## 2017-12-21 DIAGNOSIS — I10 Essential (primary) hypertension: Secondary | ICD-10-CM | POA: Diagnosis not present

## 2017-12-21 LAB — COMPREHENSIVE METABOLIC PANEL
ALT: 14 U/L (ref 6–29)
BUN/Creatinine Ratio: 26 (calc) — ABNORMAL HIGH (ref 6–22)
BUN: 24 mg/dL (ref 7–25)
Calcium: 9.4 mg/dL (ref 8.6–10.4)
Creat: 0.94 mg/dL — ABNORMAL HIGH (ref 0.60–0.93)
Glucose, Bld: 116 mg/dL — ABNORMAL HIGH (ref 65–99)
Sodium: 141 mmol/L (ref 135–146)
Total Protein: 6.8 g/dL (ref 6.1–8.1)

## 2017-12-21 LAB — TSH: TSH: 1.46 m[IU]/L (ref 0.40–4.50)

## 2017-12-21 LAB — CBC
HCT: 38.7 % (ref 35.0–45.0)
Hemoglobin: 13 g/dL (ref 11.7–15.5)
MCH: 30 pg (ref 27.0–33.0)
MCHC: 33.6 g/dL (ref 32.0–36.0)
MCV: 89.2 fL (ref 80.0–100.0)
MPV: 10.8 fL (ref 7.5–12.5)
Platelets: 277 10*3/uL (ref 140–400)
RBC: 4.34 Million/uL (ref 3.80–5.10)
RDW: 12.1 % (ref 11.0–15.0)
WBC: 5.6 10*3/uL (ref 3.8–10.8)

## 2017-12-21 LAB — COMPREHENSIVE METABOLIC PANEL WITH GFR
AG Ratio: 2.2 (calc) (ref 1.0–2.5)
AST: 18 U/L (ref 10–35)
Albumin: 4.7 g/dL (ref 3.6–5.1)
Alkaline phosphatase (APISO): 89 U/L (ref 33–130)
CO2: 32 mmol/L (ref 20–32)
Chloride: 104 mmol/L (ref 98–110)
Globulin: 2.1 g/dL (ref 1.9–3.7)
Potassium: 4 mmol/L (ref 3.5–5.3)
Total Bilirubin: 0.5 mg/dL (ref 0.2–1.2)

## 2017-12-21 LAB — LIPID PANEL W/REFLEX DIRECT LDL
Cholesterol: 184 mg/dL (ref <200)
HDL: 59 mg/dL (ref >50)
LDL Cholesterol (Calc): 105 mg/dL (calc) — ABNORMAL HIGH
Non-HDL Cholesterol (Calc): 125 mg/dL (calc) (ref <130)
Total CHOL/HDL Ratio: 3.1 (calc) (ref <5.0)
Triglycerides: 104 mg/dL (ref <150)

## 2017-12-21 MED ORDER — PRAVASTATIN SODIUM 20 MG PO TABS: 20.0000 mg | ORAL_TABLET | Freq: Every day | ORAL | 3 refills | Status: DC

## 2017-12-21 NOTE — Assessment & Plan Note (Signed)
Checking lipids, switching to pravastatin, she is having some myalgias with atorvastatin 20.

## 2017-12-21 NOTE — Assessment & Plan Note (Signed)
Annual physical as above, mammogram is due. Checking routine labs.

## 2017-12-21 NOTE — Progress Notes (Signed)
Subjective:    CC: Annual physical  HPI:  This is a pleasant 72 year old female here for her physical, her only complaint is some myalgias in the evening when taking her atorvastatin 20.  She would like to switch to a different statin.  I reviewed the past medical history, family history, social history, surgical history, and allergies today and no changes were needed.  Please see the problem list section below in epic for further details.  Past Medical History: Past Medical History:  Diagnosis Date  . Anxiety   . Depression   . Glaucoma   . Hyperlipidemia   . Hypertension    Past Surgical History: Past Surgical History:  Procedure Laterality Date  . ABDOMINAL HYSTERECTOMY     Social History: Social History   Socioeconomic History  . Marital status: Divorced    Spouse name: Not on file  . Number of children: Not on file  . Years of education: Not on file  . Highest education level: Not on file  Occupational History  . Not on file  Social Needs  . Financial resource strain: Not on file  . Food insecurity:    Worry: Not on file    Inability: Not on file  . Transportation needs:    Medical: Not on file    Non-medical: Not on file  Tobacco Use  . Smoking status: Former Games developer  . Smokeless tobacco: Never Used  . Tobacco comment: Quit 24 years ago,  Substance and Sexual Activity  . Alcohol use: No    Comment: Very seldom-5 times a year  . Drug use: No  . Sexual activity: Not Currently    Partners: Male  Lifestyle  . Physical activity:    Days per week: Not on file    Minutes per session: Not on file  . Stress: Not on file  Relationships  . Social connections:    Talks on phone: Not on file    Gets together: Not on file    Attends religious service: Not on file    Active member of club or organization: Not on file    Attends meetings of clubs or organizations: Not on file    Relationship status: Not on file  Other Topics Concern  . Not on file  Social  History Narrative  . Not on file   Family History: Family History  Problem Relation Age of Onset  . Cancer Neg Hx   . Heart attack Mother   . Hypertension Mother   . Hypertension Sister    Allergies: Allergies  Allergen Reactions  . Antihistamines, Chlorpheniramine-Type Other (See Comments)    Makes pt Hyper  . Benadryl [Diphenhydramine Hcl (Sleep)]     Makes her hyper  . Penicillins    Medications: See med rec.  Review of Systems: No headache, visual changes, nausea, vomiting, diarrhea, constipation, dizziness, abdominal pain, skin rash, fevers, chills, night sweats, swollen lymph nodes, weight loss, chest pain, body aches, joint swelling, muscle aches, shortness of breath, mood changes, visual or auditory hallucinations.  Objective:    General: Well Developed, well nourished, and in no acute distress.  Neuro: Alert and oriented x3, extra-ocular muscles intact, sensation grossly intact. Cranial nerves II through XII are intact, motor, sensory, and coordinative functions are all intact. HEENT: Normocephalic, atraumatic, pupils equal round reactive to light, neck supple, no masses, no lymphadenopathy, thyroid nonpalpable. Oropharynx, nasopharynx, external ear canals are unremarkable. Skin: Warm and dry, no rashes noted.  Cardiac: Regular rate and rhythm, no murmurs  rubs or gallops.  Respiratory: Clear to auscultation bilaterally. Not using accessory muscles, speaking in full sentences.  Abdominal: Soft, nontender, nondistended, positive bowel sounds, no masses, no organomegaly.  Musculoskeletal: Shoulder, elbow, wrist, hip, knee, ankle stable, and with full range of motion.  Impression and Recommendations:    The patient was counselled, risk factors were discussed, anticipatory guidance given.  Annual physical exam Annual physical as above, mammogram is due. Checking routine labs.  Hypertension Slightly elevated today. Rechecking labs.  Hyperlipidemia Checking lipids,  switching to pravastatin, she is having some myalgias with atorvastatin 20.  ___________________________________________ Ihor Austinhomas J. Benjamin Stainhekkekandam, M.D., ABFM., CAQSM. Primary Care and Sports Medicine  [facility]  Adjunct Instructor of Family Medicine  [employer]

## 2017-12-21 NOTE — Assessment & Plan Note (Signed)
Slightly elevated today. Rechecking labs.

## 2017-12-29 ENCOUNTER — Ambulatory Visit (INDEPENDENT_AMBULATORY_CARE_PROVIDER_SITE_OTHER): Payer: Medicare Other

## 2017-12-29 DIAGNOSIS — Z1231 Encounter for screening mammogram for malignant neoplasm of breast: Secondary | ICD-10-CM

## 2018-04-20 ENCOUNTER — Telehealth: Payer: Self-pay | Admitting: Sports Medicine

## 2018-04-20 NOTE — Telephone Encounter (Signed)
Kristy Hays called into the office letting me know that she received a denial from Medicare where they did not pay for her CPE on 12/21/17.  When I went into her account, she was charged for a [id number] and should have been charged for a [id number].  I sent a correction to billing and we need to allow 45 days for reprocessing of the claim.  WB

## 2018-05-27 ENCOUNTER — Telehealth: Payer: Self-pay | Admitting: Sports Medicine

## 2018-05-27 NOTE — Telephone Encounter (Signed)
Patient advised and is ok with it staying on her problem list.

## 2018-05-27 NOTE — Telephone Encounter (Signed)
Pt called.  Pt states diagnosis of renal insufficiency is incorrect and she would like to speak to you about this.  Thank you.

## 2018-05-27 NOTE — Telephone Encounter (Signed)
That is an old diagnosis from WHEN SHE HAD RENAL INSUFFICIENCY with an abnormally elevated creatinine.  That is basically the definition of renal insufficiency.  Would she like me to remove this from her problem list, I normally leave it there to remind myself that her kidney function can be fairly brittle and we need to simply be vigilant.

## 2018-06-29 DIAGNOSIS — I491 Atrial premature depolarization: Secondary | ICD-10-CM | POA: Insufficient documentation

## 2018-06-29 DIAGNOSIS — E119 Type 2 diabetes mellitus without complications: Secondary | ICD-10-CM | POA: Insufficient documentation

## 2018-06-29 LAB — HEMOGLOBIN A1C: Hemoglobin A1C: 6.1

## 2018-07-03 MED ORDER — GENERIC EXTERNAL MEDICATION
0.08 | Status: DC
Start: ? — End: 2018-07-03

## 2018-07-03 MED ORDER — OXYCODONE HCL 5 MG PO TABS
5.00 | ORAL_TABLET | ORAL | Status: DC
Start: ? — End: 2018-07-03

## 2018-07-03 MED ORDER — MORPHINE SULFATE (PF) 2 MG/ML IV SOLN
2.00 | INTRAVENOUS | Status: DC
Start: ? — End: 2018-07-03

## 2018-07-03 MED ORDER — GENERIC EXTERNAL MEDICATION
4.00 | Status: DC
Start: ? — End: 2018-07-03

## 2018-07-03 MED ORDER — NITROGLYCERIN 0.4 MG SL SUBL
0.40 | SUBLINGUAL_TABLET | SUBLINGUAL | Status: DC
Start: ? — End: 2018-07-03

## 2018-07-03 MED ORDER — TRAMADOL HCL 50 MG PO TABS
25.00 | ORAL_TABLET | ORAL | Status: DC
Start: 2018-07-01 — End: 2018-07-03

## 2018-07-03 MED ORDER — HYDROCODONE-ACETAMINOPHEN 5-325 MG PO TABS
1.00 | ORAL_TABLET | ORAL | Status: DC
Start: ? — End: 2018-07-03

## 2018-07-03 MED ORDER — FAMOTIDINE 20 MG PO TABS
20.00 | ORAL_TABLET | ORAL | Status: DC
Start: ? — End: 2018-07-03

## 2018-07-03 MED ORDER — GENERIC EXTERNAL MEDICATION
Status: DC
Start: 2018-07-02 — End: 2018-07-03

## 2018-07-03 MED ORDER — GENERIC EXTERNAL MEDICATION
10.00 | Status: DC
Start: ? — End: 2018-07-03

## 2018-07-03 MED ORDER — ACETAMINOPHEN 325 MG PO TABS
650.00 | ORAL_TABLET | ORAL | Status: DC
Start: 2018-07-01 — End: 2018-07-03

## 2018-07-03 MED ORDER — DOCUSATE SODIUM 100 MG PO CAPS
200.00 | ORAL_CAPSULE | ORAL | Status: DC
Start: 2018-07-02 — End: 2018-07-03

## 2018-07-03 MED ORDER — BISACODYL 5 MG PO TBEC
5.00 | DELAYED_RELEASE_TABLET | ORAL | Status: DC
Start: ? — End: 2018-07-03

## 2018-07-03 MED ORDER — BISACODYL 10 MG RE SUPP
10.00 | RECTAL | Status: DC
Start: ? — End: 2018-07-03

## 2018-07-03 MED ORDER — ENOXAPARIN SODIUM 40 MG/0.4ML ~~LOC~~ SOLN
40.00 | SUBCUTANEOUS | Status: DC
Start: 2018-07-02 — End: 2018-07-03

## 2018-07-03 MED ORDER — BENZOCAINE-MENTHOL 15-3.6 MG MT LOZG
1.00 | LOZENGE | OROMUCOSAL | Status: DC
Start: ? — End: 2018-07-03

## 2018-07-03 MED ORDER — GENERIC EXTERNAL MEDICATION
650.00 | Status: DC
Start: ? — End: 2018-07-03

## 2018-07-03 MED ORDER — ALUM & MAG HYDROXIDE-SIMETH 200-200-20 MG/5ML PO SUSP
30.00 | ORAL | Status: DC
Start: ? — End: 2018-07-03

## 2018-07-03 MED ORDER — GENERIC EXTERNAL MEDICATION
0.04 | Status: DC
Start: ? — End: 2018-07-03

## 2018-07-03 MED ORDER — HYDROCODONE-ACETAMINOPHEN 10-325 MG PO TABS
1.00 | ORAL_TABLET | ORAL | Status: DC
Start: ? — End: 2018-07-03

## 2018-07-03 MED ORDER — SENNA-DOCUSATE SODIUM 8.6-50 MG PO TABS
1.00 | ORAL_TABLET | ORAL | Status: DC
Start: 2018-07-01 — End: 2018-07-03

## 2018-07-03 MED ORDER — PAROXETINE HCL 20 MG PO TABS
20.00 | ORAL_TABLET | ORAL | Status: DC
Start: 2018-07-02 — End: 2018-07-03

## 2018-07-03 MED ORDER — ALBUTEROL SULFATE (2.5 MG/3ML) 0.083% IN NEBU
2.50 | INHALATION_SOLUTION | RESPIRATORY_TRACT | Status: DC
Start: ? — End: 2018-07-03

## 2018-07-03 MED ORDER — AMLODIPINE BESYLATE 10 MG PO TABS
10.00 | ORAL_TABLET | ORAL | Status: DC
Start: 2018-07-02 — End: 2018-07-03

## 2018-07-03 MED ORDER — HYDRALAZINE HCL 20 MG/ML IJ SOLN
5.00 | INTRAMUSCULAR | Status: DC
Start: ? — End: 2018-07-03

## 2018-07-03 MED ORDER — SODIUM CHLORIDE 0.9 % IV SOLN
10.00 | INTRAVENOUS | Status: DC
Start: ? — End: 2018-07-03

## 2018-07-03 MED ORDER — ONDANSETRON HCL 4 MG/2ML IJ SOLN
4.00 | INTRAMUSCULAR | Status: DC
Start: ? — End: 2018-07-03

## 2018-07-03 MED ORDER — PRAVASTATIN SODIUM 10 MG PO TABS
20.00 | ORAL_TABLET | ORAL | Status: DC
Start: 2018-07-02 — End: 2018-07-03

## 2018-07-03 MED ORDER — ONDANSETRON HCL 4 MG PO TABS
4.00 | ORAL_TABLET | ORAL | Status: DC
Start: ? — End: 2018-07-03

## 2018-07-03 MED ORDER — OXYCODONE HCL 5 MG PO TABS
10.00 | ORAL_TABLET | ORAL | Status: DC
Start: ? — End: 2018-07-03

## 2018-07-03 MED ORDER — FERROUS SULFATE 325 (65 FE) MG PO TABS
325.00 | ORAL_TABLET | ORAL | Status: DC
Start: 2018-07-01 — End: 2018-07-03

## 2018-07-03 MED ORDER — CARVEDILOL 25 MG PO TABS
25.00 | ORAL_TABLET | ORAL | Status: DC
Start: 2018-07-01 — End: 2018-07-03

## 2018-07-03 MED ORDER — POLYETHYLENE GLYCOL 3350 17 G PO PACK
17.00 | PACK | ORAL | Status: DC
Start: ? — End: 2018-07-03

## 2018-07-03 MED ORDER — ACETAMINOPHEN 325 MG PO TABS
650.00 | ORAL_TABLET | ORAL | Status: DC
Start: ? — End: 2018-07-03

## 2018-07-03 MED ORDER — HYDROMORPHONE HCL 1 MG/ML IJ SOLN
0.20 | INTRAMUSCULAR | Status: DC
Start: ? — End: 2018-07-03

## 2018-07-26 ENCOUNTER — Ambulatory Visit (INDEPENDENT_AMBULATORY_CARE_PROVIDER_SITE_OTHER): Payer: Medicare Other | Admitting: Sports Medicine

## 2018-07-26 ENCOUNTER — Encounter: Payer: Self-pay | Admitting: Sports Medicine

## 2018-07-26 VITALS — BP 117/72 | HR 83

## 2018-07-26 DIAGNOSIS — S72144S Nondisplaced intertrochanteric fracture of right femur, sequela: Secondary | ICD-10-CM | POA: Diagnosis not present

## 2018-07-26 DIAGNOSIS — M8080XA Other osteoporosis with current pathological fracture, unspecified site, initial encounter for fracture: Secondary | ICD-10-CM | POA: Diagnosis not present

## 2018-07-26 DIAGNOSIS — M8580 Other specified disorders of bone density and structure, unspecified site: Secondary | ICD-10-CM

## 2018-07-26 DIAGNOSIS — S72141A Displaced intertrochanteric fracture of right femur, initial encounter for closed fracture: Secondary | ICD-10-CM | POA: Insufficient documentation

## 2018-07-26 DIAGNOSIS — D500 Iron deficiency anemia secondary to blood loss (chronic): Secondary | ICD-10-CM

## 2018-07-26 LAB — POCT HEMOGLOBIN: Hemoglobin: 10.6 g/dL (ref 9.5–13.5)

## 2018-07-26 MED ORDER — FERROUS SULFATE 325 (65 FE) MG PO TBEC: 325.0000 mg | DELAYED_RELEASE_TABLET | Freq: Three times a day (TID) | ORAL | 11 refills | Status: DC

## 2018-07-26 NOTE — Progress Notes (Signed)
Subjective:    CC: Hospital follow-up  HPI: This is a pleasant 72 year old female, recently she had a fall, sustained a right intertrochanteric hip fracture, status post open reduction and internal fixation.  Doing extremely well, full weightbearing with a walker.  She had a good amount of blood loss during surgery, bled down to a hemoglobin of 9.5, 10 at discharge, 10.6 today here in the office.  I reviewed the past medical history, family history, social history, surgical history, and allergies today and no changes were needed.  Please see the problem list section below in epic for further details.  Past Medical History: Past Medical History:  Diagnosis Date  . Anxiety   . Depression   . Glaucoma   . Hyperlipidemia   . Hypertension    Past Surgical History: Past Surgical History:  Procedure Laterality Date  . ABDOMINAL HYSTERECTOMY     Social History: Social History   Socioeconomic History  . Marital status: Divorced    Spouse name: Not on file  . Number of children: Not on file  . Years of education: Not on file  . Highest education level: Not on file  Occupational History  . Not on file  Social Needs  . Financial resource strain: Not on file  . Food insecurity:    Worry: Not on file    Inability: Not on file  . Transportation needs:    Medical: Not on file    Non-medical: Not on file  Tobacco Use  . Smoking status: Former Games developermoker  . Smokeless tobacco: Never Used  . Tobacco comment: Quit 24 years ago,  Substance and Sexual Activity  . Alcohol use: No    Comment: Very seldom-5 times a year  . Drug use: No  . Sexual activity: Not Currently    Partners: Male  Lifestyle  . Physical activity:    Days per week: Not on file    Minutes per session: Not on file  . Stress: Not on file  Relationships  . Social connections:    Talks on phone: Not on file    Gets together: Not on file    Attends religious service: Not on file    Active member of club or  organization: Not on file    Attends meetings of clubs or organizations: Not on file    Relationship status: Not on file  Other Topics Concern  . Not on file  Social History Narrative  . Not on file   Family History: Family History  Problem Relation Age of Onset  . Cancer Neg Hx   . Heart attack Mother   . Hypertension Mother   . Hypertension Sister    Allergies: Allergies  Allergen Reactions  . Antihistamines, Chlorpheniramine-Type Other (See Comments)    Makes pt Hyper  . Benadryl [Diphenhydramine Hcl (Sleep)]     Makes her hyper  . Penicillins    Medications: See med rec.  Review of Systems: No fevers, chills, night sweats, weight loss, chest pain, or shortness of breath.   Objective:    General: Well Developed, well nourished, and in no acute distress.  Neuro: Alert and oriented x3, extra-ocular muscles intact, sensation grossly intact.  HEENT: Normocephalic, atraumatic, pupils equal round reactive to light, neck supple, no masses, no lymphadenopathy, thyroid nonpalpable.  Skin: Warm and dry, no rashes. Cardiac: Regular rate and rhythm, no murmurs rubs or gallops, no lower extremity edema.  Respiratory: Clear to auscultation bilaterally. Not using accessory muscles, speaking in full sentences.  Impression and Recommendations:    Closed intertrochanteric fracture of right hip status post nailing Hip is doing well post intertrochanteric fracture and ORIF. Adding a DEXA scan. Continue iron for blood loss anemia. Hemoglobin dropped to 9.5 and was up to 10 at discharge.  Blood loss anemia Blood down to a hemoglobin of 9.5 intraoperatively, 10 at discharge from the hospital and 10.6 today on a point-of-care check. Refilling iron, 325 mg p.o. 2 times daily to 3 times daily. ___________________________________________ [name]. Benjamin Stain, M.D., ABFM., CAQSM. Primary Care and Sports Medicine [place] [facility]  Adjunct Professor of Family Medicine    [employer]

## 2018-07-26 NOTE — Assessment & Plan Note (Addendum)
Hip is doing well post intertrochanteric fracture and ORIF. Adding a DEXA scan. Continue iron for blood loss anemia. Hemoglobin dropped to 9.5 and was up to 10 at discharge.

## 2018-07-26 NOTE — Assessment & Plan Note (Signed)
Blood down to a hemoglobin of 9.5 intraoperatively, 10 at discharge from the hospital and 10.6 today on a point-of-care check. Refilling iron, 325 mg p.o. 2 times daily to 3 times daily.

## 2018-07-28 ENCOUNTER — Telehealth: Payer: Self-pay | Admitting: *Deleted

## 2018-07-28 NOTE — Telephone Encounter (Signed)
VO given for PT twice a week for 4 weeks to strengthen balance and gait.

## 2018-08-03 ENCOUNTER — Ambulatory Visit (INDEPENDENT_AMBULATORY_CARE_PROVIDER_SITE_OTHER): Payer: Medicare Other

## 2018-08-03 ENCOUNTER — Other Ambulatory Visit: Payer: Self-pay

## 2018-08-03 DIAGNOSIS — M8589 Other specified disorders of bone density and structure, multiple sites: Secondary | ICD-10-CM

## 2018-08-03 DIAGNOSIS — M8580 Other specified disorders of bone density and structure, unspecified site: Secondary | ICD-10-CM | POA: Insufficient documentation

## 2018-08-03 MED ORDER — CALCIUM CARBONATE-VITAMIN D 600-400 MG-UNIT PO TABS: 1.0000 | ORAL_TABLET | Freq: Two times a day (BID) | ORAL | 11 refills | Status: DC

## 2018-08-03 NOTE — Addendum Note (Signed)
Addended by: Monica BectonHEKKEKANDAM, Adelin Ventrella J on: 08/03/2018 04:29 PM   Modules accepted: Orders

## 2018-08-03 NOTE — Assessment & Plan Note (Signed)
Results show osteopenia, calcium and vitamin D 2x a day are needed.  No evidence of osteoporosis, calling calcium/vit d.

## 2018-11-02 ENCOUNTER — Other Ambulatory Visit: Payer: Self-pay | Admitting: Sports Medicine

## 2018-11-02 DIAGNOSIS — F332 Major depressive disorder, recurrent severe without psychotic features: Secondary | ICD-10-CM

## 2018-11-02 DIAGNOSIS — I10 Essential (primary) hypertension: Secondary | ICD-10-CM

## 2019-01-12 ENCOUNTER — Other Ambulatory Visit: Payer: Self-pay | Admitting: Sports Medicine

## 2019-01-12 DIAGNOSIS — E7800 Pure hypercholesterolemia, unspecified: Secondary | ICD-10-CM

## 2019-01-24 ENCOUNTER — Other Ambulatory Visit: Payer: Self-pay

## 2019-01-24 ENCOUNTER — Ambulatory Visit (INDEPENDENT_AMBULATORY_CARE_PROVIDER_SITE_OTHER): Payer: Medicare Other | Admitting: Sports Medicine

## 2019-01-24 VITALS — BP 131/75 | HR 65 | Wt 185.0 lb

## 2019-01-24 DIAGNOSIS — E559 Vitamin D deficiency, unspecified: Secondary | ICD-10-CM

## 2019-01-24 DIAGNOSIS — N289 Disorder of kidney and ureter, unspecified: Secondary | ICD-10-CM | POA: Diagnosis not present

## 2019-01-24 DIAGNOSIS — M1711 Unilateral primary osteoarthritis, right knee: Secondary | ICD-10-CM

## 2019-01-24 DIAGNOSIS — E7800 Pure hypercholesterolemia, unspecified: Secondary | ICD-10-CM | POA: Diagnosis not present

## 2019-01-24 DIAGNOSIS — F332 Major depressive disorder, recurrent severe without psychotic features: Secondary | ICD-10-CM

## 2019-01-24 DIAGNOSIS — I10 Essential (primary) hypertension: Secondary | ICD-10-CM

## 2019-01-24 DIAGNOSIS — E039 Hypothyroidism, unspecified: Secondary | ICD-10-CM

## 2019-01-24 MED ORDER — CELECOXIB 200 MG PO CAPS: ORAL_CAPSULE | ORAL | 2 refills | Status: DC

## 2019-01-24 MED ORDER — PAROXETINE HCL 20 MG PO TABS: 20.0000 mg | ORAL_TABLET | Freq: Two times a day (BID) | ORAL | 1 refills | Status: DC

## 2019-01-24 MED ORDER — PRAVASTATIN SODIUM 10 MG PO TABS: 10.0000 mg | ORAL_TABLET | Freq: Every day | ORAL | 1 refills | Status: DC

## 2019-01-24 NOTE — Assessment & Plan Note (Signed)
I am happy to take over Paxil. Refilling this now.

## 2019-01-24 NOTE — Assessment & Plan Note (Signed)
Rechecking routine labs. 

## 2019-01-24 NOTE — Assessment & Plan Note (Signed)
Having some myalgias with pravastatin, dropping her to 10 mg daily, if persistent myalgias after a couple of months we will drop to 10 mg every other day.

## 2019-01-24 NOTE — Assessment & Plan Note (Addendum)
Switching to Celebrex, this will be easier on her stomach.

## 2019-01-24 NOTE — Progress Notes (Signed)
Subjective:    CC: Follow-up several issues  HPI: Knee osteoarthritis: Worked well in the past with simple NSAIDs, agreeable to try this again.  Hypertension: Well-controlled.  Hyperlipidemia: Historically well controlled but unfortunately has been having some myalgias with pravastatin.  Depression: Well-controlled on current antidepressant, prescribed by psychiatry and wondering if I can take over prescriptions of this.  I reviewed the past medical history, family history, social history, surgical history, and allergies today and no changes were needed.  Please see the problem list section below in epic for further details.  Past Medical History: Past Medical History:  Diagnosis Date  . Anxiety   . Depression   . Glaucoma   . Hyperlipidemia   . Hypertension    Past Surgical History: Past Surgical History:  Procedure Laterality Date  . ABDOMINAL HYSTERECTOMY     Social History: Social History   Socioeconomic History  . Marital status: Divorced    Spouse name: Not on file  . Number of children: Not on file  . Years of education: Not on file  . Highest education level: Not on file  Occupational History  . Not on file  Social Needs  . Financial resource strain: Not on file  . Food insecurity:    Worry: Not on file    Inability: Not on file  . Transportation needs:    Medical: Not on file    Non-medical: Not on file  Tobacco Use  . Smoking status: Former Games developer  . Smokeless tobacco: Never Used  . Tobacco comment: Quit 24 years ago,  Substance and Sexual Activity  . Alcohol use: No    Comment: Very seldom-5 times a year  . Drug use: No  . Sexual activity: Not Currently    Partners: Male  Lifestyle  . Physical activity:    Days per week: Not on file    Minutes per session: Not on file  . Stress: Not on file  Relationships  . Social connections:    Talks on phone: Not on file    Gets together: Not on file    Attends religious service: Not on file   Active member of club or organization: Not on file    Attends meetings of clubs or organizations: Not on file    Relationship status: Not on file  Other Topics Concern  . Not on file  Social History Narrative  . Not on file   Family History: Family History  Problem Relation Age of Onset  . Cancer Neg Hx   . Heart attack Mother   . Hypertension Mother   . Hypertension Sister    Allergies: Allergies  Allergen Reactions  . Antihistamines, Chlorpheniramine-Type Other (See Comments)    Makes pt Hyper  . Benadryl [Diphenhydramine Hcl (Sleep)]     Makes her hyper  . Penicillins    Medications: See med rec.  Review of Systems: No fevers, chills, night sweats, weight loss, chest pain, or shortness of breath.   Objective:    General: Well Developed, well nourished, and in no acute distress.  Neuro: Alert and oriented x3, extra-ocular muscles intact, sensation grossly intact.  HEENT: Normocephalic, atraumatic, pupils equal round reactive to light, neck supple, no masses, no lymphadenopathy, thyroid nonpalpable.  Skin: Warm and dry, no rashes. Cardiac: Regular rate and rhythm, no murmurs rubs or gallops, no lower extremity edema.  Respiratory: Clear to auscultation bilaterally. Not using accessory muscles, speaking in full sentences.  Impression and Recommendations:    Primary osteoarthritis of right  knee Switching to Celebrex, this will be easier on her stomach.  Renal insufficiency Rechecking routine labs.  Hyperlipidemia Having some myalgias with pravastatin, dropping her to 10 mg daily, if persistent myalgias after a couple of months we will drop to 10 mg every other day.  Hypertension Well-controlled on current medications, no changes.  Major depressive disorder, recurrent severe without psychotic features I am happy to take over Paxil. Refilling this now.   ___________________________________________ Ihor Austinhomas J. Benjamin Stainhekkekandam, M.D., ABFM., CAQSM. Primary Care and  Sports Medicine [facility]  Adjunct Professor of Family Medicine  [employer]

## 2019-01-24 NOTE — Assessment & Plan Note (Signed)
Well-controlled on current medications, no changes. 

## 2019-01-25 LAB — COMPLETE METABOLIC PANEL WITH GFR
AG Ratio: 1.8 (calc) (ref 1.0–2.5)
ALT: 16 U/L (ref 6–29)
AST: 19 U/L (ref 10–35)
Albumin: 4.5 g/dL (ref 3.6–5.1)
Alkaline phosphatase (APISO): 93 U/L (ref 37–153)
BUN: 18 mg/dL (ref 7–25)
CO2: 28 mmol/L (ref 20–32)
Calcium: 9.9 mg/dL (ref 8.6–10.4)
Chloride: 105 mmol/L (ref 98–110)
Creat: 0.93 mg/dL (ref 0.60–0.93)
GFR, Est African American: 71 mL/min/{1.73_m2} (ref >=60)
GFR, Est Non African American: 61 mL/min/{1.73_m2} (ref >=60)
Globulin: 2.5 g/dL (calc) (ref 1.9–3.7)
Glucose, Bld: 102 mg/dL — ABNORMAL HIGH (ref 65–99)
Potassium: 4.4 mmol/L (ref 3.5–5.3)
Sodium: 142 mmol/L (ref 135–146)
Total Bilirubin: 0.4 mg/dL (ref 0.2–1.2)
Total Protein: 7 g/dL (ref 6.1–8.1)

## 2019-01-25 LAB — VITAMIN D 25 HYDROXY (VIT D DEFICIENCY, FRACTURES): Vit D, 25-Hydroxy: 40 ng/mL (ref 30–100)

## 2019-01-25 LAB — CBC
HCT: 38.7 % (ref 35.0–45.0)
Hemoglobin: 13.4 g/dL (ref 11.7–15.5)
MCH: 30.7 pg (ref 27.0–33.0)
MCHC: 34.6 g/dL (ref 32.0–36.0)
MCV: 88.8 fL (ref 80.0–100.0)
MPV: 10.6 fL (ref 7.5–12.5)
Platelets: 273 10*3/uL (ref 140–400)
RBC: 4.36 10*6/uL (ref 3.80–5.10)
RDW: 12.1 % (ref 11.0–15.0)
WBC: 5.6 10*3/uL (ref 3.8–10.8)

## 2019-01-25 LAB — LIPID PANEL W/REFLEX DIRECT LDL
Cholesterol: 217 mg/dL — ABNORMAL HIGH (ref <200)
HDL: 63 mg/dL (ref >=50)
LDL Cholesterol (Calc): 131 mg/dL (calc) — ABNORMAL HIGH
Non-HDL Cholesterol (Calc): 154 mg/dL (calc) — ABNORMAL HIGH (ref <130)
Total CHOL/HDL Ratio: 3.4 (calc) (ref <5.0)
Triglycerides: 120 mg/dL (ref <150)

## 2019-01-25 LAB — TSH: TSH: 1.1 mIU/L (ref 0.40–4.50)

## 2019-02-07 ENCOUNTER — Other Ambulatory Visit: Payer: Self-pay | Admitting: Sports Medicine

## 2019-02-07 DIAGNOSIS — F332 Major depressive disorder, recurrent severe without psychotic features: Secondary | ICD-10-CM

## 2019-02-07 DIAGNOSIS — I10 Essential (primary) hypertension: Secondary | ICD-10-CM

## 2019-03-27 ENCOUNTER — Ambulatory Visit (INDEPENDENT_AMBULATORY_CARE_PROVIDER_SITE_OTHER): Payer: Medicare Other | Admitting: Sports Medicine

## 2019-03-27 ENCOUNTER — Encounter: Payer: Self-pay | Admitting: Sports Medicine

## 2019-03-27 ENCOUNTER — Other Ambulatory Visit: Payer: Self-pay

## 2019-03-27 DIAGNOSIS — E7800 Pure hypercholesterolemia, unspecified: Secondary | ICD-10-CM

## 2019-03-27 LAB — COMPLETE METABOLIC PANEL WITH GFR
AG Ratio: 1.8 (calc) (ref 1.0–2.5)
ALT: 11 U/L (ref 6–29)
AST: 16 U/L (ref 10–35)
Albumin: 4.4 g/dL (ref 3.6–5.1)
Alkaline phosphatase (APISO): 80 U/L (ref 37–153)
BUN/Creatinine Ratio: 23 (calc) — ABNORMAL HIGH (ref 6–22)
BUN: 23 mg/dL (ref 7–25)
CO2: 33 mmol/L — ABNORMAL HIGH (ref 20–32)
Calcium: 9.8 mg/dL (ref 8.6–10.4)
Chloride: 104 mmol/L (ref 98–110)
Creat: 1.02 mg/dL — ABNORMAL HIGH (ref 0.60–0.93)
GFR, Est African American: 63 mL/min/{1.73_m2} (ref >=60)
GFR, Est Non African American: 54 mL/min/{1.73_m2} — ABNORMAL LOW (ref >=60)
Globulin: 2.4 g/dL (calc) (ref 1.9–3.7)
Glucose, Bld: 121 mg/dL — ABNORMAL HIGH (ref 65–99)
Potassium: 4.2 mmol/L (ref 3.5–5.3)
Sodium: 141 mmol/L (ref 135–146)
Total Bilirubin: 0.5 mg/dL (ref 0.2–1.2)
Total Protein: 6.8 g/dL (ref 6.1–8.1)

## 2019-03-27 LAB — LIPID PANEL W/REFLEX DIRECT LDL
Cholesterol: 199 mg/dL (ref <200)
HDL: 57 mg/dL (ref >=50)
LDL Cholesterol (Calc): 118 mg/dL (calc) — ABNORMAL HIGH
Non-HDL Cholesterol (Calc): 142 mg/dL (calc) — ABNORMAL HIGH (ref <130)
Total CHOL/HDL Ratio: 3.5 (calc) (ref <5.0)
Triglycerides: 126 mg/dL (ref <150)

## 2019-03-27 MED ORDER — REPATHA 140 MG/ML ~~LOC~~ SOSY: 1.0000 | PREFILLED_SYRINGE | SUBCUTANEOUS | 11 refills | Status: DC

## 2019-03-27 NOTE — Progress Notes (Signed)
Subjective:    CC: Follow-up  HPI: Hyperlipidemia: Still having difficulty tolerating even pravastatin at 10 mg, desires to switch to something else such as Repatha.  Hypertension: Controlled.  I reviewed the past medical history, family history, social history, surgical history, and allergies today and no changes were needed.  Please see the problem list section below in epic for further details.  Past Medical History: Past Medical History:  Diagnosis Date  . Anxiety   . Depression   . Glaucoma   . Hyperlipidemia   . Hypertension    Past Surgical History: Past Surgical History:  Procedure Laterality Date  . ABDOMINAL HYSTERECTOMY     Social History: Social History   Socioeconomic History  . Marital status: Divorced    Spouse name: Not on file  . Number of children: Not on file  . Years of education: Not on file  . Highest education level: Not on file  Occupational History  . Not on file  Social Needs  . Financial resource strain: Not on file  . Food insecurity    Worry: Not on file    Inability: Not on file  . Transportation needs    Medical: Not on file    Non-medical: Not on file  Tobacco Use  . Smoking status: Former Research scientist (life sciences)  . Smokeless tobacco: Never Used  . Tobacco comment: Quit 24 years ago,  Substance and Sexual Activity  . Alcohol use: No    Comment: Very seldom-5 times a year  . Drug use: No  . Sexual activity: Not Currently    Partners: Male  Lifestyle  . Physical activity    Days per week: Not on file    Minutes per session: Not on file  . Stress: Not on file  Relationships  . Social Herbalist on phone: Not on file    Gets together: Not on file    Attends religious service: Not on file    Active member of club or organization: Not on file    Attends meetings of clubs or organizations: Not on file    Relationship status: Not on file  Other Topics Concern  . Not on file  Social History Narrative  . Not on file   Family  History: Family History  Problem Relation Age of Onset  . Cancer Neg Hx   . Heart attack Mother   . Hypertension Mother   . Hypertension Sister    Allergies: Allergies  Allergen Reactions  . Antihistamines, Chlorpheniramine-Type Other (See Comments)    Makes pt Hyper  . Benadryl [Diphenhydramine Hcl (Sleep)]     Makes her hyper  . Penicillins    Medications: See med rec.  Review of Systems: No fevers, chills, night sweats, weight loss, chest pain, or shortness of breath.   Objective:    General: Well Developed, well nourished, and in no acute distress.  Neuro: Alert and oriented x3, extra-ocular muscles intact, sensation grossly intact.  HEENT: Normocephalic, atraumatic, pupils equal round reactive to light, neck supple, no masses, no lymphadenopathy, thyroid nonpalpable.  Skin: Warm and dry, no rashes. Cardiac: Regular rate and rhythm, no murmurs rubs or gallops, no lower extremity edema.  Respiratory: Clear to auscultation bilaterally. Not using accessory muscles, speaking in full sentences.  Impression and Recommendations:    Hyperlipidemia With mixed hyperlipidemia, we have tried several statins including pravastatin most recently, and she continues to have persistent myalgias. This is even after dropping down to 10 mg. At this point we  are going to switch to Repatha with a diagnosis of hyperlipidemia intolerant to statins. If this is too expensive we will then try fenofibrate.   ___________________________________________ Ihor Austinhomas J. Benjamin Stainhekkekandam, M.D., ABFM., CAQSM. Primary Care and Sports Medicine [facility]  Adjunct Professor of Family Medicine  [employer]

## 2019-03-27 NOTE — Assessment & Plan Note (Signed)
With mixed hyperlipidemia, we have tried several statins including pravastatin most recently, and she continues to have persistent myalgias. This is even after dropping down to 10 mg. At this point we are going to switch to [place] with a diagnosis of hyperlipidemia intolerant to statins. If this is too expensive we will then try fenofibrate.

## 2019-03-28 ENCOUNTER — Telehealth: Payer: Self-pay | Admitting: Sports Medicine

## 2019-03-28 NOTE — Telephone Encounter (Signed)
Received a fax from insurance that [place] was approved from 03/28/2019 through 09/24/2019. Pharmacy aware.

## 2019-03-28 NOTE — Telephone Encounter (Signed)
Received fax from Covermymeds that Repatha requires a PA. Information has been sent to the insurance company. Awaiting determination.   

## 2019-04-04 ENCOUNTER — Ambulatory Visit (INDEPENDENT_AMBULATORY_CARE_PROVIDER_SITE_OTHER): Payer: Medicare Other | Admitting: Sports Medicine

## 2019-04-04 ENCOUNTER — Other Ambulatory Visit: Payer: Self-pay

## 2019-04-04 VITALS — BP 123/73 | HR 67 | Temp 98.0°F | Ht 64.0 in | Wt 186.0 lb

## 2019-04-04 DIAGNOSIS — E7800 Pure hypercholesterolemia, unspecified: Secondary | ICD-10-CM

## 2019-04-04 NOTE — Progress Notes (Signed)
Patient presents to clinic to [place] [facility]. Patient was given a handout on the injection sites and how to give the injection. I instructed the patient on how to give the injection first and then she demon started back with no questions. I had the patient give herself the injection. Patient did not have any immediate complications immediately after the injection. Patient prefers to give the injection in the abdomen. No further questions during the visit.

## 2019-04-23 ENCOUNTER — Other Ambulatory Visit: Payer: Self-pay | Admitting: Sports Medicine

## 2019-04-23 DIAGNOSIS — E7800 Pure hypercholesterolemia, unspecified: Secondary | ICD-10-CM

## 2019-05-19 ENCOUNTER — Other Ambulatory Visit: Payer: Self-pay | Admitting: Sports Medicine

## 2019-05-19 DIAGNOSIS — E7800 Pure hypercholesterolemia, unspecified: Secondary | ICD-10-CM

## 2019-05-19 MED ORDER — REPATHA 140 MG/ML ~~LOC~~ SOSY: 1.0000 | PREFILLED_SYRINGE | SUBCUTANEOUS | 11 refills | Status: DC

## 2019-07-05 ENCOUNTER — Ambulatory Visit (INDEPENDENT_AMBULATORY_CARE_PROVIDER_SITE_OTHER): Payer: Medicare Other | Admitting: Sports Medicine

## 2019-07-05 ENCOUNTER — Other Ambulatory Visit: Payer: Self-pay

## 2019-07-05 ENCOUNTER — Encounter: Payer: Self-pay | Admitting: Sports Medicine

## 2019-07-05 DIAGNOSIS — E7800 Pure hypercholesterolemia, unspecified: Secondary | ICD-10-CM

## 2019-07-05 NOTE — Assessment & Plan Note (Signed)
Did not tolerate multiple statins, we switched to Repatha, she is doing well, checking fasting lipids tomorrow.

## 2019-07-05 NOTE — Progress Notes (Signed)
   Hyperlipidemia Did not tolerate multiple statins, we switched to [place], she is doing well, checking fasting lipids tomorrow.  I am going to no charge this visit, she really supposed to just go downstairs for labs.

## 2019-07-07 LAB — COMPLETE METABOLIC PANEL WITH GFR
AG Ratio: 1.8 (calc) (ref 1.0–2.5)
ALT: 16 U/L (ref 6–29)
AST: 17 U/L (ref 10–35)
Albumin: 4 g/dL (ref 3.6–5.1)
Alkaline phosphatase (APISO): 84 U/L (ref 37–153)
BUN/Creatinine Ratio: 18 (calc) (ref 6–22)
BUN: 17 mg/dL (ref 7–25)
CO2: 31 mmol/L (ref 20–32)
Calcium: 9.4 mg/dL (ref 8.6–10.4)
Chloride: 106 mmol/L (ref 98–110)
Creat: 0.96 mg/dL — ABNORMAL HIGH (ref 0.60–0.93)
GFR, Est African American: 68 mL/min/{1.73_m2} (ref >=60)
GFR, Est Non African American: 58 mL/min/{1.73_m2} — ABNORMAL LOW (ref >=60)
Globulin: 2.2 g/dL (calc) (ref 1.9–3.7)
Glucose, Bld: 113 mg/dL — ABNORMAL HIGH (ref 65–99)
Potassium: 3.7 mmol/L (ref 3.5–5.3)
Sodium: 142 mmol/L (ref 135–146)
Total Bilirubin: 0.4 mg/dL (ref 0.2–1.2)
Total Protein: 6.2 g/dL (ref 6.1–8.1)

## 2019-07-07 LAB — CBC
HCT: 37.1 % (ref 35.0–45.0)
Hemoglobin: 12.6 g/dL (ref 11.7–15.5)
MCH: 30.5 pg (ref 27.0–33.0)
MCHC: 34 g/dL (ref 32.0–36.0)
MCV: 89.8 fL (ref 80.0–100.0)
MPV: 10.8 fL (ref 7.5–12.5)
Platelets: 250 10*3/uL (ref 140–400)
RBC: 4.13 10*6/uL (ref 3.80–5.10)
RDW: 12.6 % (ref 11.0–15.0)
WBC: 4.4 10*3/uL (ref 3.8–10.8)

## 2019-07-07 LAB — LIPID PANEL W/REFLEX DIRECT LDL
Cholesterol: 151 mg/dL (ref <200)
HDL: 60 mg/dL (ref >=50)
LDL Cholesterol (Calc): 73 mg/dL (calc)
Non-HDL Cholesterol (Calc): 91 mg/dL (calc) (ref <130)
Total CHOL/HDL Ratio: 2.5 (calc) (ref <5.0)
Triglycerides: 96 mg/dL (ref <150)

## 2019-07-07 LAB — TSH: TSH: 1.47 mIU/L (ref 0.40–4.50)

## 2019-07-25 ENCOUNTER — Other Ambulatory Visit: Payer: Self-pay | Admitting: Sports Medicine

## 2019-07-25 DIAGNOSIS — F332 Major depressive disorder, recurrent severe without psychotic features: Secondary | ICD-10-CM

## 2019-07-25 DIAGNOSIS — I10 Essential (primary) hypertension: Secondary | ICD-10-CM

## 2019-08-09 ENCOUNTER — Other Ambulatory Visit: Payer: Self-pay

## 2019-08-14 ENCOUNTER — Other Ambulatory Visit: Payer: Self-pay | Admitting: Sports Medicine

## 2019-08-14 DIAGNOSIS — E7800 Pure hypercholesterolemia, unspecified: Secondary | ICD-10-CM

## 2019-09-28 NOTE — Progress Notes (Deleted)
Subjective:   Kristy Hays is a 74 y.o. female who presents for Medicare Annual (Subsequent) preventive examination.  Review of Systems:  No ROS.  Medicare Wellness Virtual Visit.  Visual/audio telehealth visit, UTA vital signs.   See social history for additional risk factors.      Sleep patterns:    Home Safety/Smoke Alarms: Feels safe in home. Smoke alarms in place.  Living environment;  Seat Belt Safety/Bike Helmet: Wears seat belt.   Female:   Pap-  Aged out     Mammo- UTD      Dexa scan- UTD       CCS- UTD     Objective:     Vitals: There were no vitals taken for this visit.  There is no height or weight on file to calculate BMI.  No flowsheet data found.  Tobacco Social History   Tobacco Use  Smoking Status Former Smoker  Smokeless Tobacco Never Used  Tobacco Comment   Quit 24 years ago,     Counseling given: Not Answered Comment: Quit 24 years ago,   Clinical Intake:                       Past Medical History:  Diagnosis Date  . Anxiety   . Depression   . Glaucoma   . Hyperlipidemia   . Hypertension    Past Surgical History:  Procedure Laterality Date  . ABDOMINAL HYSTERECTOMY     Family History  Problem Relation Age of Onset  . Cancer Neg Hx   . Heart attack Mother   . Hypertension Mother   . Hypertension Sister    Social History   Socioeconomic History  . Marital status: Divorced    Spouse name: Not on file  . Number of children: Not on file  . Years of education: Not on file  . Highest education level: Not on file  Occupational History  . Not on file  Tobacco Use  . Smoking status: Former Games developer  . Smokeless tobacco: Never Used  . Tobacco comment: Quit 24 years ago,  Substance and Sexual Activity  . Alcohol use: No    Comment: Very seldom-5 times a year  . Drug use: No  . Sexual activity: Not Currently    Partners: Male  Other Topics Concern  . Not on file  Social History Narrative  . Not on file    Social Determinants of Health   Financial Resource Strain:   . Difficulty of Paying Living Expenses: Not on file  Food Insecurity:   . Worried About Programme researcher, broadcasting/film/video in the Last Year: Not on file  . Ran Out of Food in the Last Year: Not on file  Transportation Needs:   . Lack of Transportation (Medical): Not on file  . Lack of Transportation (Non-Medical): Not on file  Physical Activity:   . Days of Exercise per Week: Not on file  . Minutes of Exercise per Session: Not on file  Stress:   . Feeling of Stress : Not on file  Social Connections:   . Frequency of Communication with Friends and Family: Not on file  . Frequency of Social Gatherings with Friends and Family: Not on file  . Attends Religious Services: Not on file  . Active Member of Clubs or Organizations: Not on file  . Attends Banker Meetings: Not on file  . Marital Status: Not on file    Outpatient Encounter Medications as of 10/03/2019  Medication Sig  . amLODipine (NORVASC) 10 MG tablet Take 1 tablet by mouth once daily  . Calcium Carbonate-Vitamin D 600-400 MG-UNIT tablet Take 1 tablet by mouth 2 (two) times daily.  . carvedilol (COREG) 25 MG tablet TAKE 1 TABLET BY MOUTH TWICE DAILY WITH A MEAL  . Evolocumab (REPATHA) 140 MG/ML SOSY Inject 1 Dose into the skin every 14 (fourteen) days.  . ferrous sulfate 325 (65 FE) MG EC tablet Take 1 tablet (325 mg total) by mouth 3 (three) times daily with meals.  Marland Kitchen lisinopril-hydrochlorothiazide (ZESTORETIC) 20-25 MG tablet Take 1 tablet by mouth once daily  . loratadine (CLARITIN) 10 MG tablet Take 1 tablet (10 mg total) by mouth daily.  . Multiple Vitamin (MULTIVITAMIN WITH MINERALS) TABS tablet Take 1 tablet by mouth daily.  Marland Kitchen PARoxetine (PAXIL) 20 MG tablet Take 1 tablet (20 mg total) by mouth every 12 (twelve) hours. (Patient taking differently: Take 20 mg by mouth daily. )  . TRAVATAN Z 0.004 % SOLN ophthalmic solution    No facility-administered  encounter medications on file as of 10/03/2019.    Activities of Daily Living In your present state of health, do you have any difficulty performing the following activities: 04/04/2019  Hearing? N  Vision? N  Difficulty concentrating or making decisions? N  Walking or climbing stairs? N  Dressing or bathing? N  Doing errands, shopping? N  Some recent data might be hidden    Patient Care Team: Monica Becton, MD as PCP - General (Family Medicine)    Assessment:   This is a routine wellness examination for [place].Physical assessment deferred to PCP.   Exercise Activities and Dietary recommendations   Diet  Breakfast: Lunch:  Dinner:       Goals   None     Fall Risk Fall Risk  08/09/2019 08/03/2018 04/09/2017 08/14/2014  Falls in the past year? 0 1 Yes No  Comment Emmi Telephone Survey: data to providers prior to load Emmi Telephone Survey: data to providers prior to load Emmi Telephone Survey: data to providers prior to load -  Number falls in past yr: - 1 1 -  Comment - Emmi Telephone Survey Actual Response = 1 Emmi Telephone Survey Actual Response = 1 -  Injury with Fall? - 1 No -  Risk for fall due to : - - - History of fall(s)   Is the patient's home free of loose throw rugs in walkways, pet beds, electrical cords, etc?   {Blank single:19197::"yes","no"}      Grab bars in the bathroom? {Blank single:19197::"yes","no"}      Handrails on the stairs?   {Blank single:19197::"yes","no"}      Adequate lighting?   {Blank single:19197::"yes","no"}   Depression Screen PHQ 2/9 Scores 07/26/2018 11/24/2016 08/14/2014  PHQ - 2 Score 0 0 0     Cognitive Function        Immunization History  Administered Date(s) Administered  . Influenza Split 05/15/2012  . Influenza, High Dose Seasonal PF 05/16/2019  . Influenza-Unspecified 06/17/2018  . Pneumococcal Conjugate-13 08/19/2015  . Pneumococcal Polysaccharide-23 08/09/2012  . Tdap 09/14/1998, 08/09/2012  . Zoster  08/09/2012    Screening Tests Health Maintenance  Topic Date Due  . MAMMOGRAM  12/30/2019  . TETANUS/TDAP  08/09/2022  . COLONOSCOPY  10/10/2025  . INFLUENZA VACCINE  Completed  . DEXA SCAN  Completed  . Hepatitis C Screening  Completed  . PNA vac Low Risk Adult  Completed      Plan:   ***  I have personally reviewed and noted the following in the patient's chart:   . Medical and social history . Use of alcohol, tobacco or illicit drugs  . Current medications and supplements . Functional ability and status . Nutritional status . Physical activity . Advanced directives . List of other physicians . Hospitalizations, surgeries, and ER visits in previous 12 months . Vitals . Screenings to include cognitive, depression, and falls . Referrals and appointments  In addition, I have reviewed and discussed with patient certain preventive protocols, quality metrics, and best practice recommendations. A written personalized care plan for preventive services as well as general preventive health recommendations were provided to patient.     Joanne Chars, LPN  [id number]

## 2019-10-27 ENCOUNTER — Other Ambulatory Visit: Payer: Self-pay | Admitting: Nurse Practitioner

## 2019-10-27 ENCOUNTER — Encounter: Payer: Self-pay | Admitting: Nurse Practitioner

## 2019-11-01 ENCOUNTER — Other Ambulatory Visit: Payer: Self-pay | Admitting: Sports Medicine

## 2019-11-01 DIAGNOSIS — F332 Major depressive disorder, recurrent severe without psychotic features: Secondary | ICD-10-CM

## 2019-11-01 DIAGNOSIS — I10 Essential (primary) hypertension: Secondary | ICD-10-CM

## 2019-11-14 ENCOUNTER — Other Ambulatory Visit: Payer: Self-pay | Admitting: Sports Medicine

## 2019-11-14 DIAGNOSIS — E7800 Pure hypercholesterolemia, unspecified: Secondary | ICD-10-CM

## 2019-11-28 ENCOUNTER — Encounter: Payer: Self-pay | Admitting: Nurse Practitioner

## 2020-01-24 ENCOUNTER — Other Ambulatory Visit: Payer: Self-pay | Admitting: Sports Medicine

## 2020-01-24 DIAGNOSIS — F332 Major depressive disorder, recurrent severe without psychotic features: Secondary | ICD-10-CM

## 2020-02-08 ENCOUNTER — Other Ambulatory Visit: Payer: Self-pay | Admitting: Sports Medicine

## 2020-02-08 DIAGNOSIS — F332 Major depressive disorder, recurrent severe without psychotic features: Secondary | ICD-10-CM

## 2020-02-08 DIAGNOSIS — I10 Essential (primary) hypertension: Secondary | ICD-10-CM

## 2020-02-08 DIAGNOSIS — E7800 Pure hypercholesterolemia, unspecified: Secondary | ICD-10-CM

## 2020-03-20 ENCOUNTER — Encounter: Payer: Self-pay | Admitting: Sports Medicine

## 2020-05-03 ENCOUNTER — Other Ambulatory Visit: Payer: Self-pay | Admitting: Sports Medicine

## 2020-05-03 DIAGNOSIS — F332 Major depressive disorder, recurrent severe without psychotic features: Secondary | ICD-10-CM

## 2020-05-03 DIAGNOSIS — I10 Essential (primary) hypertension: Secondary | ICD-10-CM

## 2020-06-11 ENCOUNTER — Other Ambulatory Visit: Payer: Self-pay | Admitting: Sports Medicine

## 2020-06-11 DIAGNOSIS — E7800 Pure hypercholesterolemia, unspecified: Secondary | ICD-10-CM

## 2020-06-18 ENCOUNTER — Other Ambulatory Visit: Payer: Self-pay | Admitting: Sports Medicine

## 2020-06-18 DIAGNOSIS — E7800 Pure hypercholesterolemia, unspecified: Secondary | ICD-10-CM

## 2020-07-10 ENCOUNTER — Other Ambulatory Visit: Payer: Self-pay | Admitting: Sports Medicine

## 2020-07-10 DIAGNOSIS — E7800 Pure hypercholesterolemia, unspecified: Secondary | ICD-10-CM

## 2020-07-27 ENCOUNTER — Other Ambulatory Visit: Payer: Self-pay | Admitting: Sports Medicine

## 2020-07-27 DIAGNOSIS — F332 Major depressive disorder, recurrent severe without psychotic features: Secondary | ICD-10-CM

## 2020-07-27 DIAGNOSIS — I10 Essential (primary) hypertension: Secondary | ICD-10-CM

## 2020-07-28 ENCOUNTER — Other Ambulatory Visit: Payer: Self-pay | Admitting: Sports Medicine

## 2020-07-28 DIAGNOSIS — F332 Major depressive disorder, recurrent severe without psychotic features: Secondary | ICD-10-CM

## 2020-07-29 ENCOUNTER — Telehealth: Payer: Self-pay | Admitting: Sports Medicine

## 2020-07-29 DIAGNOSIS — E7800 Pure hypercholesterolemia, unspecified: Secondary | ICD-10-CM

## 2020-07-29 NOTE — Telephone Encounter (Signed)
Pt scheduled her Medicare wellness for next Tuesday but would like to get her yearly labs done before her appt time so can you place this order and call & let patient know when to go for labs. Thank you

## 2020-07-30 NOTE — Addendum Note (Signed)
Addended by: Monica Becton on: 07/30/2020 02:43 PM   Modules accepted: Orders

## 2020-07-30 NOTE — Telephone Encounter (Signed)
Left message on VM that labs orders were in, verified lab hours, and reminded her to be fasting.

## 2020-07-30 NOTE — Telephone Encounter (Signed)
Done

## 2020-08-01 ENCOUNTER — Other Ambulatory Visit: Payer: Self-pay | Admitting: Sports Medicine

## 2020-08-01 DIAGNOSIS — E7800 Pure hypercholesterolemia, unspecified: Secondary | ICD-10-CM

## 2020-08-06 ENCOUNTER — Encounter: Payer: Medicare Other | Admitting: Sports Medicine

## 2020-08-06 LAB — COMPREHENSIVE METABOLIC PANEL
AG Ratio: 1.9 (calc) (ref 1.0–2.5)
ALT: 15 U/L (ref 6–29)
AST: 16 U/L (ref 10–35)
Albumin: 4.4 g/dL (ref 3.6–5.1)
Alkaline phosphatase (APISO): 83 U/L (ref 37–153)
BUN/Creatinine Ratio: 15 (calc) (ref 6–22)
BUN: 15 mg/dL (ref 7–25)
CO2: 30 mmol/L (ref 20–32)
Calcium: 9.6 mg/dL (ref 8.6–10.4)
Chloride: 104 mmol/L (ref 98–110)
Creat: 1.02 mg/dL — ABNORMAL HIGH (ref 0.60–0.93)
Globulin: 2.3 g/dL (calc) (ref 1.9–3.7)
Glucose, Bld: 111 mg/dL — ABNORMAL HIGH (ref 65–99)
Potassium: 3.8 mmol/L (ref 3.5–5.3)
Sodium: 143 mmol/L (ref 135–146)
Total Bilirubin: 0.5 mg/dL (ref 0.2–1.2)
Total Protein: 6.7 g/dL (ref 6.1–8.1)

## 2020-08-06 LAB — CBC
HCT: 40.3 % (ref 35.0–45.0)
Hemoglobin: 13.6 g/dL (ref 11.7–15.5)
MCH: 30.4 pg (ref 27.0–33.0)
MCHC: 33.7 g/dL (ref 32.0–36.0)
MCV: 90 fL (ref 80.0–100.0)
MPV: 10.4 fL (ref 7.5–12.5)
Platelets: 290 10*3/uL (ref 140–400)
RBC: 4.48 10*6/uL (ref 3.80–5.10)
RDW: 12.2 % (ref 11.0–15.0)
WBC: 4.6 10*3/uL (ref 3.8–10.8)

## 2020-08-06 LAB — LIPID PANEL
Cholesterol: 171 mg/dL (ref <200)
HDL: 69 mg/dL (ref >=50)
LDL Cholesterol (Calc): 80 mg/dL (calc)
Non-HDL Cholesterol (Calc): 102 mg/dL (calc) (ref <130)
Total CHOL/HDL Ratio: 2.5 (calc) (ref <5.0)
Triglycerides: 120 mg/dL (ref <150)

## 2020-08-06 LAB — TSH: TSH: 1.51 mIU/L (ref 0.40–4.50)

## 2020-08-16 ENCOUNTER — Ambulatory Visit (INDEPENDENT_AMBULATORY_CARE_PROVIDER_SITE_OTHER): Payer: Medicare Other | Admitting: Sports Medicine

## 2020-08-16 ENCOUNTER — Other Ambulatory Visit: Payer: Self-pay

## 2020-08-16 ENCOUNTER — Encounter: Payer: Self-pay | Admitting: Sports Medicine

## 2020-08-16 VITALS — BP 107/63 | HR 72 | Ht 64.0 in | Wt 186.0 lb

## 2020-08-16 DIAGNOSIS — E7800 Pure hypercholesterolemia, unspecified: Secondary | ICD-10-CM | POA: Diagnosis not present

## 2020-08-16 DIAGNOSIS — Z0000 Encounter for general adult medical examination without abnormal findings: Secondary | ICD-10-CM

## 2020-08-16 DIAGNOSIS — M8580 Other specified disorders of bone density and structure, unspecified site: Secondary | ICD-10-CM

## 2020-08-16 DIAGNOSIS — E119 Type 2 diabetes mellitus without complications: Secondary | ICD-10-CM

## 2020-08-16 DIAGNOSIS — I10 Essential (primary) hypertension: Secondary | ICD-10-CM

## 2020-08-16 DIAGNOSIS — M810 Age-related osteoporosis without current pathological fracture: Secondary | ICD-10-CM

## 2020-08-16 LAB — POCT GLYCOSYLATED HEMOGLOBIN (HGB A1C): Hemoglobin A1C: 6.2 % — AB (ref 4.0–5.6)

## 2020-08-16 MED ORDER — ASPIRIN EC 81 MG PO TBEC: 81.0000 mg | DELAYED_RELEASE_TABLET | Freq: Every day | ORAL | 3 refills | Status: DC

## 2020-08-16 MED ORDER — SHINGRIX 50 MCG/0.5ML IM SUSR: 0.5000 mL | Freq: Once | INTRAMUSCULAR | 0 refills | Status: AC

## 2020-08-16 NOTE — Assessment & Plan Note (Signed)
Annual physical exam as above, adding Shingrix vaccination.

## 2020-08-16 NOTE — Assessment & Plan Note (Signed)
A1c well controlled at 6.2%, up-to-date on eye examination, foot exam done today. Adding some aspirin considering risk factors.

## 2020-08-16 NOTE — Progress Notes (Signed)
Subjective:   Kristy Hays is a 74 y.o. female who presents for Medicare Annual (Subsequent) preventive examination.  Review of Systems    Comprehensive review of systems is unremarkable       Objective:    Today's Vitals   08/16/20 0910  BP: 107/63  Pulse: 72  Weight: 186 lb (84.4 kg)  Height: 5\' 4"  (1.626 m)   Body mass index is 31.93 kg/m.  No flowsheet data found.  Current Medications (verified) Outpatient Encounter Medications as of 08/16/2020  Medication Sig   amLODipine (NORVASC) 10 MG tablet Take 1 tablet by mouth once daily   aspirin EC 81 MG tablet Take 1 tablet (81 mg total) by mouth daily.   Calcium Carbonate-Vitamin D 600-400 MG-UNIT tablet Take 1 tablet by mouth 2 (two) times daily.   carvedilol (COREG) 25 MG tablet TAKE 1 TABLET BY MOUTH TWICE DAILY WITH A MEAL   ferrous sulfate 325 (65 FE) MG EC tablet Take 1 tablet (325 mg total) by mouth 3 (three) times daily with meals.   lisinopril-hydrochlorothiazide (ZESTORETIC) 20-25 MG tablet Take 1 tablet by mouth once daily   loratadine (CLARITIN) 10 MG tablet Take 1 tablet (10 mg total) by mouth daily.   Multiple Vitamin (MULTIVITAMIN WITH MINERALS) TABS tablet Take 1 tablet by mouth daily.   PARoxetine (PAXIL) 20 MG tablet TAKE 1 TABLET BY MOUTH EVERY 12 HOURS   REPATHA 140 MG/ML SOSY INJECT ONE DOSE INTO THE SKIN EVERY 14 DAYS   Zoster Vaccine Adjuvanted [facility]) injection Inject 0.5 mLs into the muscle once for 1 dose. Administer at 0 and 2-6 months for 2 total doses.  To be administered by pharmacy.   [DISCONTINUED] TRAVATAN Z 0.004 % SOLN ophthalmic solution    No facility-administered encounter medications on file as of 08/16/2020.    Allergies (verified) Antihistamines, chlorpheniramine-type; Benadryl [diphenhydramine hcl (sleep)]; and Penicillins   History: Past Medical History:  Diagnosis Date   Anxiety    Depression    Glaucoma    Hyperlipidemia    Hypertension    Past  Surgical History:  Procedure Laterality Date   ABDOMINAL HYSTERECTOMY     Family History  Problem Relation Age of Onset   Cancer Neg Hx    Heart attack Mother    Hypertension Mother    Hypertension Sister    Social History   Socioeconomic History   Marital status: Divorced    Spouse name: Not on file   Number of children: Not on file   Years of education: Not on file   Highest education level: Not on file  Occupational History   Not on file  Tobacco Use   Smoking status: Former Smoker   Smokeless tobacco: Never Used   Tobacco comment: Quit 24 years ago,  Substance and Sexual Activity   Alcohol use: No    Comment: Very seldom-5 times a year   Drug use: No   Sexual activity: Not Currently    Partners: Male  Other Topics Concern   Not on file  Social History Narrative   Not on file   Social Determinants of Health   Financial Resource Strain:    Difficulty of Paying Living Expenses: Not on file  Food Insecurity:    Worried About 14/11/2019 in the Last Year: Not on file   Programme researcher, broadcasting/film/video of Food in the Last Year: Not on file  Transportation Needs:    Lack of Transportation (Medical): Not on file  Lack of Transportation (Non-Medical): Not on file  Physical Activity:    Days of Exercise per Week: Not on file   Minutes of Exercise per Session: Not on file  Stress:    Feeling of Stress : Not on file  Social Connections:    Frequency of Communication with Friends and Family: Not on file   Frequency of Social Gatherings with Friends and Family: Not on file   Attends Religious Services: Not on file   Active Member of Clubs or Organizations: Not on file   Attends Banker Meetings: Not on file   Marital Status: Not on file    Tobacco Counseling Counseling given: Not Answered Comment: Quit 24 years ago,      Diabetic? Yes   Activities of Daily Living No flowsheet data found.  Patient Care Team: Monica Becton, MD as PCP - General (Family Medicine)  Indicate any recent Medical Services you may have received from other than [facility] providers in the past year (date may be approximate).     Assessment:   This is a routine wellness examination for Kristy Hays.  Hearing/Vision screen No exam data present  Dietary issues and exercise activities discussed:    Goals   None    Depression Screen PHQ 2/9 Scores 08/16/2020 08/16/2020 07/26/2018 11/24/2016 08/14/2014  PHQ - 2 Score 0 0 0 0 0  PHQ- 9 Score 0 - - - -    Fall Risk Fall Risk  08/16/2020 08/09/2019 08/03/2018 04/09/2017 08/14/2014  Falls in the past year? 0 0 1 Yes No  Comment - [employer] Telephone Survey: data to providers prior to load [employer] Telephone Survey: data to providers prior to load [employer] Telephone Survey: data to providers prior to load -  Number falls in past yr: - - 1 1 -  Comment - - [facility] Telephone Survey Actual Response = 1 [employer] Telephone Survey Actual Response = 1 -  Injury with Fall? - - 1 No -  Risk for fall due to : - - - - History of fall(s)    Any stairs in or around the home? Yes  If so, are there any without handrails? Yes  Home free of loose throw rugs in walkways, pet beds, electrical cords, etc? Yes  Adequate lighting in your home to reduce risk of falls? Yes   ASSISTIVE DEVICES UTILIZED TO PREVENT FALLS:  Life alert? No  Use of a cane, walker or w/c? No  Grab bars in the bathroom? No  Shower chair or bench in shower? No  Elevated toilet seat or a handicapped toilet? No   TIMED UP AND GO:  Was the test performed? Yes .  Length of time to ambulate 10 feet: 5 sec.   Gait steady and fast without use of assistive device  Cognitive Function:     6CIT Screen 08/16/2020  What Year? 0 points  What month? 0 points  What time? 0 points  Count back from 20 0 points  Months in reverse 0 points  Repeat phrase 2 points  Total Score 2    Immunizations Immunization History  Administered Date(s) Administered    Influenza Split 05/15/2012   Influenza, High Dose Seasonal PF 05/16/2019   Influenza-Unspecified 06/17/2018, 05/26/2020   PFIZER SARS-COV-2 Vaccination 10/04/2019, 10/25/2019, 06/07/2020   Pneumococcal Conjugate-13 08/19/2015   Pneumococcal Polysaccharide-23 08/09/2012   Tdap 09/14/1998, 08/09/2012   Zoster 08/09/2012    TDAP status: Up to date Flu Vaccine status: Up to date Pneumococcal vaccine status: Up  to date Covid-19 vaccine status: Completed vaccines  Qualifies for Shingles Vaccine? Yes   Zostavax completed Yes   Shingrix Completed?: No.    Education has been provided regarding the importance of this vaccine. Patient has been advised to call insurance company to determine out of pocket expense if they have not yet received this vaccine. Advised may also receive vaccine at local pharmacy or Health Dept. Verbalized acceptance and understanding.  Screening Tests Health Maintenance  Topic Date Due   HEMOGLOBIN A1C  02/14/2021   OPHTHALMOLOGY EXAM  02/26/2021   FOOT EXAM  08/16/2021   TETANUS/TDAP  08/09/2022   COLONOSCOPY  10/10/2025   INFLUENZA VACCINE  Completed   DEXA SCAN  Completed   COVID-19 Vaccine  Completed   Hepatitis C Screening  Completed   PNA vac Low Risk Adult  Completed    Health Maintenance  There are no preventive care reminders to display for this patient.  Colorectal cancer screening: No longer required.  Mammogram status: No longer required.  Bone Density status: Completed Osteopenia. Results reflect: Bone density results: OSTEOPENIA. Repeat every 2 years.  Lung Cancer Screening: (Low Dose CT Chest recommended if Age 21-80 years, 30 pack-year currently smoking OR have quit w/in 15years.) does not qualify.   Lung Cancer Screening Referral: N/A  Additional Screening:  Vision Screening: Recommended annual ophthalmology exams for early detection of glaucoma and other disorders of the eye. Is the patient up to date with their annual  eye exam?  Yes   Dental Screening: Recommended annual dental exams for proper oral hygiene  Community Resource Referral / Chronic Care Management: CRR required this visit?  No   CCM required this visit?  No      Plan:    Annual physical exam Annual physical exam as above, adding Shingrix vaccination.   Hyperlipidemia Well-controlled on Repatha, no change in plan.  Hypertension Well-controlled on current medication.  Type 2 diabetes mellitus without complication, without long-term current use of insulin (HCC) A1c well controlled at 6.2%, up-to-date on eye examination, foot exam done today. Adding some aspirin considering risk factors.  Osteopenia Osteopenia 2 years ago, repeating.   I have personally reviewed and noted the following in the patients chart:    Medical and social history  Use of alcohol, tobacco or illicit drugs   Current medications and supplements  Functional ability and status  Nutritional status  Physical activity  Advanced directives  List of other physicians  Hospitalizations, surgeries, and ER visits in previous 12 months  Vitals  Screenings to include cognitive, depression, and falls  Referrals and appointments  In addition, I have reviewed and discussed with patient certain preventive protocols, quality metrics, and best practice recommendations. A written personalized care plan for preventive services as well as general preventive health recommendations were provided to patient.     Rodney Langton, MD   08/16/2020

## 2020-08-16 NOTE — Assessment & Plan Note (Signed)
Well-controlled on Repatha, no change in plan.

## 2020-08-16 NOTE — Assessment & Plan Note (Signed)
Osteopenia 2 years ago, repeating.

## 2020-08-16 NOTE — Assessment & Plan Note (Signed)
Well- controlled on current medication 

## 2020-08-27 ENCOUNTER — Other Ambulatory Visit: Payer: Self-pay | Admitting: Sports Medicine

## 2020-08-27 DIAGNOSIS — E7800 Pure hypercholesterolemia, unspecified: Secondary | ICD-10-CM

## 2020-08-28 ENCOUNTER — Other Ambulatory Visit: Payer: Medicare Other

## 2020-09-09 ENCOUNTER — Other Ambulatory Visit: Payer: Self-pay | Admitting: Sports Medicine

## 2020-09-09 DIAGNOSIS — E7800 Pure hypercholesterolemia, unspecified: Secondary | ICD-10-CM

## 2020-09-10 ENCOUNTER — Other Ambulatory Visit: Payer: Self-pay | Admitting: Sports Medicine

## 2020-09-10 DIAGNOSIS — E7800 Pure hypercholesterolemia, unspecified: Secondary | ICD-10-CM

## 2020-09-11 ENCOUNTER — Ambulatory Visit (INDEPENDENT_AMBULATORY_CARE_PROVIDER_SITE_OTHER): Payer: Medicare Other

## 2020-09-11 ENCOUNTER — Other Ambulatory Visit: Payer: Self-pay

## 2020-09-11 DIAGNOSIS — M810 Age-related osteoporosis without current pathological fracture: Secondary | ICD-10-CM | POA: Diagnosis not present

## 2020-09-11 DIAGNOSIS — M8580 Other specified disorders of bone density and structure, unspecified site: Secondary | ICD-10-CM | POA: Diagnosis not present

## 2020-09-11 MED ORDER — CALCIUM CARBONATE-VITAMIN D 600-400 MG-UNIT PO TABS: 1.0000 | ORAL_TABLET | Freq: Two times a day (BID) | ORAL | 11 refills | Status: AC

## 2020-09-23 DIAGNOSIS — H401132 Primary open-angle glaucoma, bilateral, moderate stage: Secondary | ICD-10-CM | POA: Diagnosis not present

## 2020-10-10 ENCOUNTER — Telehealth: Payer: Self-pay

## 2020-10-10 NOTE — Telephone Encounter (Signed)
PA submitted for Repatha through Covermymeds.

## 2020-10-11 ENCOUNTER — Telehealth: Payer: Self-pay

## 2020-10-11 NOTE — Telephone Encounter (Signed)
PA was initially denied. Appeal completed today. Awaiting response.

## 2020-10-11 NOTE — Telephone Encounter (Signed)
Patient brought her copy of the denial letter by the office. Appeal was completed via telephone. Response expected within 7 days.

## 2020-10-11 NOTE — Telephone Encounter (Signed)
Pt. Dropped off an envelope and stated that it's to help her get the rx she needs. If she can't get this Rx she is asking that you give something else in it's place. Placed envelope in the message box.

## 2020-10-17 NOTE — Telephone Encounter (Signed)
Received approval from [employer] for patient's Repatha it is good from September 14, 2020 through October 18, 2023. Left message for patient to make her aware as well. Approval faxed to pharmacy.

## 2020-10-18 ENCOUNTER — Other Ambulatory Visit: Payer: Self-pay | Admitting: Sports Medicine

## 2020-10-18 ENCOUNTER — Telehealth: Payer: Self-pay | Admitting: Sports Medicine

## 2020-10-18 DIAGNOSIS — E7800 Pure hypercholesterolemia, unspecified: Secondary | ICD-10-CM

## 2020-10-18 NOTE — Telephone Encounter (Signed)
Patient called, stating that her insurance card was scanned in and asked did the PA go through, after current insurance card was scanned in, she stated it was for REPATHA? AM

## 2020-10-18 NOTE — Telephone Encounter (Signed)
Left message for patient that the Repatha was approved and letter was faxed to pharmacy.

## 2020-11-11 ENCOUNTER — Other Ambulatory Visit: Payer: Self-pay | Admitting: Sports Medicine

## 2020-11-11 ENCOUNTER — Telehealth: Payer: Self-pay

## 2020-11-11 DIAGNOSIS — I10 Essential (primary) hypertension: Secondary | ICD-10-CM

## 2020-11-11 DIAGNOSIS — F332 Major depressive disorder, recurrent severe without psychotic features: Secondary | ICD-10-CM

## 2020-11-11 NOTE — Telephone Encounter (Signed)
Patient calls to report that she is having some low BP readings (90/60) and this makes her feel very tired. She is wanting to know if you would be willing to cut her BP meds in half to see if this helps. Please advise.

## 2020-11-12 NOTE — Telephone Encounter (Signed)
Yes absolutely, lets drop to a half tab of lisinopril/hctz.  Leaving amlodipine alone.

## 2020-11-13 NOTE — Telephone Encounter (Signed)
Patient aware to decrease the dose of her lisinopril/hctz.  She is going to keep some readings at home and send a mychart message to make sure her readings are stabilizing.

## 2020-11-19 ENCOUNTER — Ambulatory Visit: Payer: Medicare Other | Admitting: Sports Medicine

## 2020-11-27 ENCOUNTER — Other Ambulatory Visit: Payer: Self-pay | Admitting: Sports Medicine

## 2020-11-27 DIAGNOSIS — E7800 Pure hypercholesterolemia, unspecified: Secondary | ICD-10-CM

## 2020-12-13 ENCOUNTER — Other Ambulatory Visit: Payer: Self-pay | Admitting: Sports Medicine

## 2020-12-13 DIAGNOSIS — E7800 Pure hypercholesterolemia, unspecified: Secondary | ICD-10-CM

## 2020-12-24 ENCOUNTER — Other Ambulatory Visit: Payer: Self-pay | Admitting: Sports Medicine

## 2020-12-24 DIAGNOSIS — E7800 Pure hypercholesterolemia, unspecified: Secondary | ICD-10-CM

## 2021-01-15 ENCOUNTER — Other Ambulatory Visit: Payer: Self-pay | Admitting: Sports Medicine

## 2021-01-15 DIAGNOSIS — F332 Major depressive disorder, recurrent severe without psychotic features: Secondary | ICD-10-CM

## 2021-01-17 DIAGNOSIS — F332 Major depressive disorder, recurrent severe without psychotic features: Secondary | ICD-10-CM

## 2021-01-17 MED ORDER — PAROXETINE HCL 20 MG PO TABS: 20.0000 mg | ORAL_TABLET | ORAL | 3 refills | Status: DC

## 2021-01-21 ENCOUNTER — Other Ambulatory Visit: Payer: Self-pay | Admitting: Sports Medicine

## 2021-01-21 DIAGNOSIS — E7800 Pure hypercholesterolemia, unspecified: Secondary | ICD-10-CM

## 2021-01-21 DIAGNOSIS — H401132 Primary open-angle glaucoma, bilateral, moderate stage: Secondary | ICD-10-CM | POA: Diagnosis not present

## 2021-02-12 ENCOUNTER — Other Ambulatory Visit: Payer: Self-pay | Admitting: Sports Medicine

## 2021-02-12 DIAGNOSIS — I10 Essential (primary) hypertension: Secondary | ICD-10-CM

## 2021-02-12 DIAGNOSIS — F332 Major depressive disorder, recurrent severe without psychotic features: Secondary | ICD-10-CM

## 2021-02-12 MED ORDER — LISINOPRIL-HYDROCHLOROTHIAZIDE 10-12.5 MG PO TABS: 1.0000 | ORAL_TABLET | Freq: Every day | ORAL | 3 refills | Status: DC

## 2021-02-13 ENCOUNTER — Other Ambulatory Visit: Payer: Self-pay | Admitting: Sports Medicine

## 2021-02-13 DIAGNOSIS — E7800 Pure hypercholesterolemia, unspecified: Secondary | ICD-10-CM

## 2021-03-07 ENCOUNTER — Other Ambulatory Visit: Payer: Self-pay | Admitting: Sports Medicine

## 2021-03-07 DIAGNOSIS — E7800 Pure hypercholesterolemia, unspecified: Secondary | ICD-10-CM

## 2021-03-28 ENCOUNTER — Telehealth: Payer: Self-pay

## 2021-03-28 DIAGNOSIS — E7800 Pure hypercholesterolemia, unspecified: Secondary | ICD-10-CM

## 2021-03-28 MED ORDER — FENOFIBRATE 160 MG PO TABS: 160.0000 mg | ORAL_TABLET | Freq: Every day | ORAL | 3 refills | Status: DC

## 2021-03-28 NOTE — Telephone Encounter (Signed)
Patient called to report that she has exhausted all options (coupons, discount cards, etc) and cannot afford medication. She would like to see that the other options are.   She is aware that you will not return to the office until July 19th and may not get an answer until after that date.

## 2021-03-28 NOTE — Assessment & Plan Note (Signed)
Repatha no longer covered, statin intolerant, switching to fenofibrate.

## 2021-03-28 NOTE — Telephone Encounter (Signed)
Ok no problem, switching to fenofibrate, recheck fasting lipids in 3 months. (Non-statin and generic)

## 2021-03-29 ENCOUNTER — Other Ambulatory Visit: Payer: Self-pay | Admitting: Sports Medicine

## 2021-03-29 DIAGNOSIS — E7800 Pure hypercholesterolemia, unspecified: Secondary | ICD-10-CM

## 2021-03-31 NOTE — Telephone Encounter (Signed)
Patient aware of the change in cholesterol meds and the needs to recheck labs in 3 months. Reminder set to enter orders for labs and call patient in 3 months.

## 2021-05-08 ENCOUNTER — Other Ambulatory Visit: Payer: Self-pay | Admitting: Sports Medicine

## 2021-05-08 DIAGNOSIS — I10 Essential (primary) hypertension: Secondary | ICD-10-CM

## 2021-06-12 DIAGNOSIS — M1711 Unilateral primary osteoarthritis, right knee: Secondary | ICD-10-CM | POA: Diagnosis not present

## 2021-06-27 ENCOUNTER — Telehealth: Payer: Self-pay | Admitting: Sports Medicine

## 2021-06-27 DIAGNOSIS — E119 Type 2 diabetes mellitus without complications: Secondary | ICD-10-CM

## 2021-06-27 NOTE — Telephone Encounter (Signed)
Labs ordered.

## 2021-06-30 DIAGNOSIS — E119 Type 2 diabetes mellitus without complications: Secondary | ICD-10-CM | POA: Diagnosis not present

## 2021-07-01 LAB — LIPID PANEL
Cholesterol: 223 mg/dL — ABNORMAL HIGH (ref <200)
HDL: 61 mg/dL (ref >=50)
LDL Cholesterol (Calc): 142 mg/dL (calc) — ABNORMAL HIGH
Non-HDL Cholesterol (Calc): 162 mg/dL (calc) — ABNORMAL HIGH (ref <130)
Total CHOL/HDL Ratio: 3.7 (calc) (ref <5.0)
Triglycerides: 93 mg/dL (ref <150)

## 2021-07-01 LAB — COMPREHENSIVE METABOLIC PANEL
AG Ratio: 2.1 (calc) (ref 1.0–2.5)
ALT: 21 U/L (ref 6–29)
AST: 19 U/L (ref 10–35)
Albumin: 4.6 g/dL (ref 3.6–5.1)
Alkaline phosphatase (APISO): 58 U/L (ref 37–153)
BUN/Creatinine Ratio: 21 (calc) (ref 6–22)
BUN: 23 mg/dL (ref 7–25)
CO2: 28 mmol/L (ref 20–32)
Calcium: 10.1 mg/dL (ref 8.6–10.4)
Chloride: 106 mmol/L (ref 98–110)
Creat: 1.11 mg/dL — ABNORMAL HIGH (ref 0.60–1.00)
Globulin: 2.2 g/dL (calc) (ref 1.9–3.7)
Glucose, Bld: 110 mg/dL — ABNORMAL HIGH (ref 65–99)
Potassium: 4.3 mmol/L (ref 3.5–5.3)
Sodium: 143 mmol/L (ref 135–146)
Total Bilirubin: 0.4 mg/dL (ref 0.2–1.2)
Total Protein: 6.8 g/dL (ref 6.1–8.1)

## 2021-07-01 LAB — CBC
HCT: 39.3 % (ref 35.0–45.0)
Hemoglobin: 13.1 g/dL (ref 11.7–15.5)
MCH: 30.3 pg (ref 27.0–33.0)
MCHC: 33.3 g/dL (ref 32.0–36.0)
MCV: 91 fL (ref 80.0–100.0)
MPV: 10.4 fL (ref 7.5–12.5)
Platelets: 299 10*3/uL (ref 140–400)
RBC: 4.32 10*6/uL (ref 3.80–5.10)
RDW: 12.1 % (ref 11.0–15.0)
WBC: 4.1 10*3/uL (ref 3.8–10.8)

## 2021-07-01 LAB — HEMOGLOBIN A1C
Hgb A1c MFr Bld: 6 % of total Hgb — ABNORMAL HIGH (ref <5.7)
Mean Plasma Glucose: 126 mg/dL
eAG (mmol/L): 7 mmol/L

## 2021-07-01 LAB — TSH: TSH: 1.11 mIU/L (ref 0.40–4.50)

## 2021-07-02 ENCOUNTER — Other Ambulatory Visit: Payer: Self-pay | Admitting: Sports Medicine

## 2021-07-02 DIAGNOSIS — E7800 Pure hypercholesterolemia, unspecified: Secondary | ICD-10-CM

## 2021-07-24 DIAGNOSIS — M1711 Unilateral primary osteoarthritis, right knee: Secondary | ICD-10-CM | POA: Diagnosis not present

## 2021-08-11 ENCOUNTER — Other Ambulatory Visit: Payer: Self-pay | Admitting: Sports Medicine

## 2021-08-11 DIAGNOSIS — I10 Essential (primary) hypertension: Secondary | ICD-10-CM

## 2021-08-18 ENCOUNTER — Other Ambulatory Visit: Payer: Self-pay

## 2021-08-18 ENCOUNTER — Ambulatory Visit (INDEPENDENT_AMBULATORY_CARE_PROVIDER_SITE_OTHER): Payer: Medicare (Managed Care) | Admitting: Sports Medicine

## 2021-08-18 VITALS — BP 121/76 | HR 69 | Wt 179.1 lb

## 2021-08-18 DIAGNOSIS — I491 Atrial premature depolarization: Secondary | ICD-10-CM

## 2021-08-18 DIAGNOSIS — M1711 Unilateral primary osteoarthritis, right knee: Secondary | ICD-10-CM

## 2021-08-18 DIAGNOSIS — Z0000 Encounter for general adult medical examination without abnormal findings: Secondary | ICD-10-CM

## 2021-08-18 DIAGNOSIS — E7800 Pure hypercholesterolemia, unspecified: Secondary | ICD-10-CM | POA: Diagnosis not present

## 2021-08-18 MED ORDER — EZETIMIBE 10 MG PO TABS: 10.0000 mg | ORAL_TABLET | Freq: Every day | ORAL | 3 refills | Status: DC

## 2021-08-18 NOTE — Assessment & Plan Note (Signed)
Kristy Hays continues to have elevated lipids, she is statin intolerant and Repatha is no longer covered, fenofibrate provided minimal improvement, adding Zetia, recheck in 3 months.

## 2021-08-18 NOTE — Assessment & Plan Note (Signed)
Irregularly irregular rate, previous ECGs have shown PACs, asymptomatic, return as needed.  No further intervention needed.

## 2021-08-18 NOTE — Assessment & Plan Note (Signed)
Working with Dr. Darnell Level [facility], steroid injection did not provide good relief, they are planning viscosupplementation. I did give her an exercise prescription.

## 2021-08-18 NOTE — Progress Notes (Signed)
Subjective:   Kristy Hays is a 75 y.o. female who presents for Medicare Annual (Subsequent) preventive examination.  Review of Systems    Comprehensive review of systems is negative.     Objective:    Today's Vitals   08/18/21 0931  BP: 121/76  Pulse: 69  SpO2: 96%  Weight: 179 lb 1.9 oz (81.2 kg)   Body mass index is 30.75 kg/m.  No flowsheet data found.  Current Medications (verified) Outpatient Encounter Medications as of 08/18/2021  Medication Sig   amLODipine (NORVASC) 10 MG tablet Take 1 tablet (10 mg total) by mouth daily. Patient needs an appt before refills can be given. Please call office to schedule.   aspirin EC 81 MG tablet Take 1 tablet (81 mg total) by mouth daily.   Calcium Carbonate-Vitamin D 600-400 MG-UNIT tablet Take 1 tablet by mouth 2 (two) times daily.   carvedilol (COREG) 25 MG tablet TAKE 1 TABLET BY MOUTH TWICE DAILY WITH A MEAL   ezetimibe (ZETIA) 10 MG tablet Take 1 tablet (10 mg total) by mouth daily.   fenofibrate 160 MG tablet Take 1 tablet (160 mg total) by mouth daily.   ferrous sulfate 325 (65 FE) MG EC tablet Take 1 tablet (325 mg total) by mouth 3 (three) times daily with meals.   lisinopril-hydrochlorothiazide (ZESTORETIC) 10-12.5 MG tablet Take 1 tablet by mouth daily.   loratadine (CLARITIN) 10 MG tablet Take 1 tablet (10 mg total) by mouth daily.   Multiple Vitamin (MULTIVITAMIN WITH MINERALS) TABS tablet Take 1 tablet by mouth daily.   PARoxetine (PAXIL) 20 MG tablet Take 1 tablet (20 mg total) by mouth every morning.   No facility-administered encounter medications on file as of 08/18/2021.    Allergies (verified) Antihistamines, chlorpheniramine-type; Benadryl [diphenhydramine hcl (sleep)]; and Penicillins   History: Past Medical History:  Diagnosis Date   Anxiety    Depression    Glaucoma    Hyperlipidemia    Hypertension    Past Surgical History:  Procedure Laterality Date   ABDOMINAL HYSTERECTOMY     Family  History  Problem Relation Age of Onset   Cancer Neg Hx    Heart attack Mother    Hypertension Mother    Hypertension Sister    Social History   Socioeconomic History   Marital status: Divorced    Spouse name: Not on file   Number of children: Not on file   Years of education: Not on file   Highest education level: Not on file  Occupational History   Not on file  Tobacco Use   Smoking status: Former   Smokeless tobacco: Never   Tobacco comments:    Quit 24 years ago,  Substance and Sexual Activity   Alcohol use: No    Comment: Very seldom-5 times a year   Drug use: No   Sexual activity: Not Currently    Partners: Male  Other Topics Concern   Not on file  Social History Narrative   Not on file   Social Determinants of Health   Financial Resource Strain: Not on file  Food Insecurity: Not on file  Transportation Needs: Not on file  Physical Activity: Not on file  Stress: Not on file  Social Connections: Not on file    Tobacco Counseling Counseling given: Not Answered Tobacco comments: Quit 24 years ago,  Activities of Daily Living No flowsheet data found.  Patient Care Team: Monica Becton, MD as PCP - General (Family Medicine)  Indicate  any recent Medical Services you may have received from other than [facility] providers in the past year (date may be approximate).     Assessment:   This is a routine wellness examination for Kristy Hays.  Hearing/Vision screen No results found.  Dietary issues and exercise activities discussed:     Goals Addressed   None   Depression Screen PHQ 2/9 Scores 08/18/2021 08/16/2020 08/16/2020 07/26/2018 11/24/2016 08/14/2014  PHQ - 2 Score 0 0 0 0 0 0  PHQ- 9 Score 0 0 - - - -    Fall Risk Fall Risk  08/18/2021 08/16/2020 08/09/2019 08/03/2018 04/09/2017  Falls in the past year? 0 0 0 1 Yes  Comment - - [facility] Telephone Survey: data to providers prior to load [employer] Telephone Survey: data to providers prior to load [employer] Telephone  Survey: data to providers prior to load  Number falls in past yr: 0 - - 1 1  Comment - - - [employer] Telephone Survey Actual Response = 1 [employer] Telephone Survey Actual Response = 1  Injury with Fall? 0 - - 1 No  Risk for fall due to : - - - - -    FALL RISK PREVENTION PERTAINING TO THE HOME:  Any stairs in or around the home? Yes  If so, are there any without handrails? No  Home free of loose throw rugs in walkways, pet beds, electrical cords, etc? Yes  Adequate lighting in your home to reduce risk of falls? Yes   ASSISTIVE DEVICES UTILIZED TO PREVENT FALLS:  Life alert? No  Use of a cane, walker or w/c? No  Grab bars in the bathroom? Yes  Shower chair or bench in shower? No  Elevated toilet seat or a handicapped toilet? No   TIMED UP AND GO:  Was the test performed? No .  Length of time to ambulate 10 feet: 10 sec.   Gait steady and fast without use of assistive device  Cognitive Function:     6CIT Screen 08/16/2020  What Year? 0 points  What month? 0 points  What time? 0 points  Count back from 20 0 points  Months in reverse 0 points  Repeat phrase 2 points  Total Score 2    Immunizations Immunization History  Administered Date(s) Administered   Fluad Quad(high Dose 65+) 05/26/2021   Influenza Split 05/15/2012   Influenza, High Dose Seasonal PF 05/16/2019   Influenza-Unspecified 06/17/2018, 05/26/2020   PFIZER(Purple Top)SARS-COV-2 Vaccination 10/04/2019, 10/25/2019, 06/07/2020, 05/26/2021   Pneumococcal Conjugate-13 08/19/2015   Pneumococcal Polysaccharide-23 08/09/2012   Tdap 09/14/1998, 08/09/2012   Zoster Recombinat (Shingrix) 12/28/2019, 08/14/2020   Zoster, Live 08/09/2012    TDAP status: Up to date  Flu Vaccine status: Up to date  Pneumococcal vaccine status: Up to date  Covid-19 vaccine status: Completed vaccines  Qualifies for Shingles Vaccine? No   Zostavax completed No   Shingrix Completed?: Yes  Screening Tests Health Maintenance  Topic  Date Due   FOOT EXAM  08/16/2021   COVID-19 Vaccine (5 - Booster for Pfizer series) 12/12/2021 (Originally 07/21/2021)   HEMOGLOBIN A1C  12/29/2021   OPHTHALMOLOGY EXAM  05/30/2022   TETANUS/TDAP  08/09/2022   Pneumonia Vaccine 48+ Years old  Completed   INFLUENZA VACCINE  Completed   DEXA SCAN  Completed   Hepatitis C Screening  Completed   Zoster Vaccines- Shingrix  Completed   HPV VACCINES  Aged Out   COLONOSCOPY (Pts 45-66yrs Insurance coverage will need to be confirmed)  Discontinued  Health Maintenance  Health Maintenance Due  Topic Date Due   FOOT EXAM  08/16/2021    Colorectal cancer screening: No longer required.   Mammogram status: No longer required due to age.  Bone Density status: Completed 2021. Results reflect: Bone density results: NORMAL. Repeat every 2 years.  Lung Cancer Screening: (Low Dose CT Chest recommended if Age 16-80 years, 30 pack-year currently smoking OR have quit w/in 15years.) does not qualify.   Lung Cancer Screening Referral: N/A  Additional Screening:  Vision Screening: Recommended annual ophthalmology exams for early detection of glaucoma and other disorders of the eye. Is the patient up to date with their annual eye exam?  Yes   Dental Screening: Recommended annual dental exams for proper oral hygiene   Plan:     I have personally reviewed and noted the following in the patient's chart:   Medical and social history Use of alcohol, tobacco or illicit drugs  Current medications and supplements including opioid prescriptions.  Functional ability and status Nutritional status Physical activity Advanced directives List of other physicians Hospitalizations, surgeries, and ER visits in previous 12 months Vitals Screenings to include cognitive, depression, and falls Referrals and appointments   Annual physical exam Medicare physical as above, she does report that she is happy, no falls, she is up-to-date on screening measures,  due for foot exam today so we will knock this out. Return to see me in a year.  Hyperlipidemia Kristy Hays continues to have elevated lipids, she is statin intolerant and Repatha is no longer covered, fenofibrate provided minimal improvement, adding Zetia, recheck in 3 months.  Premature atrial contraction Irregularly irregular rate, previous ECGs have shown PACs, asymptomatic, return as needed.  No further intervention needed.  Primary osteoarthritis of right knee Working with Dr. Darnell Level [facility], steroid injection did not provide good relief, they are planning viscosupplementation. I did give her an exercise prescription.   In addition, I have reviewed and discussed with patient certain preventive protocols, quality metrics, and best practice recommendations. A written personalized care plan for preventive services as well as general preventive health recommendations were provided to patient.     Rodney Langton, MD   08/18/2021

## 2021-08-18 NOTE — Assessment & Plan Note (Signed)
Medicare physical as above, she does report that she is happy, no falls, she is up-to-date on screening measures, due for foot exam today so we will knock this out. Return to see me in a year.

## 2021-09-04 DIAGNOSIS — M1711 Unilateral primary osteoarthritis, right knee: Secondary | ICD-10-CM | POA: Diagnosis not present

## 2021-10-27 ENCOUNTER — Other Ambulatory Visit: Payer: Self-pay

## 2021-10-27 ENCOUNTER — Ambulatory Visit (INDEPENDENT_AMBULATORY_CARE_PROVIDER_SITE_OTHER): Payer: Medicare (Managed Care)

## 2021-10-27 ENCOUNTER — Emergency Department (INDEPENDENT_AMBULATORY_CARE_PROVIDER_SITE_OTHER): Payer: Medicare (Managed Care)

## 2021-10-27 ENCOUNTER — Ambulatory Visit (INDEPENDENT_AMBULATORY_CARE_PROVIDER_SITE_OTHER): Payer: Medicare (Managed Care) | Admitting: Sports Medicine

## 2021-10-27 ENCOUNTER — Emergency Department (INDEPENDENT_AMBULATORY_CARE_PROVIDER_SITE_OTHER)
Admission: EM | Admit: 2021-10-27 | Discharge: 2021-10-27 | Disposition: A | Discharge status: Home / Self Care | Payer: Medicare (Managed Care) | Source: Home / Self Care | Attending: Family Medicine | Admitting: Family Medicine

## 2021-10-27 DIAGNOSIS — W010XXA Fall on same level from slipping, tripping and stumbling without subsequent striking against object, initial encounter: Secondary | ICD-10-CM | POA: Diagnosis not present

## 2021-10-27 DIAGNOSIS — M25511 Pain in right shoulder: Secondary | ICD-10-CM | POA: Diagnosis not present

## 2021-10-27 DIAGNOSIS — S4291XA Fracture of right shoulder girdle, part unspecified, initial encounter for closed fracture: Secondary | ICD-10-CM

## 2021-10-27 DIAGNOSIS — S43014A Anterior dislocation of right humerus, initial encounter: Secondary | ICD-10-CM | POA: Diagnosis not present

## 2021-10-27 DIAGNOSIS — M25711 Osteophyte, right shoulder: Secondary | ICD-10-CM | POA: Diagnosis not present

## 2021-10-27 DIAGNOSIS — S43004A Unspecified dislocation of right shoulder joint, initial encounter: Secondary | ICD-10-CM | POA: Diagnosis not present

## 2021-10-27 DIAGNOSIS — S43011A Anterior subluxation of right humerus, initial encounter: Secondary | ICD-10-CM | POA: Diagnosis not present

## 2021-10-27 DIAGNOSIS — S43034A Inferior dislocation of right humerus, initial encounter: Secondary | ICD-10-CM | POA: Diagnosis not present

## 2021-10-27 DIAGNOSIS — M19011 Primary osteoarthritis, right shoulder: Secondary | ICD-10-CM | POA: Diagnosis not present

## 2021-10-27 MED ORDER — HYDROCODONE-ACETAMINOPHEN 10-325 MG PO TABS: 1.0000 | ORAL_TABLET | Freq: Three times a day (TID) | ORAL | 0 refills | Status: DC | PRN

## 2021-10-27 MED ORDER — KETOROLAC TROMETHAMINE 30 MG/ML IJ SOLN
30.0000 mg | Freq: Once | INTRAMUSCULAR | Status: AC
  Administered 2021-10-27: 30 mg via INTRAVENOUS

## 2021-10-27 NOTE — ED Notes (Signed)
Provider notified of abnormal vitals.

## 2021-10-27 NOTE — Discharge Instructions (Signed)
Follow up with Dr Benjamin Stain

## 2021-10-27 NOTE — ED Provider Notes (Signed)
[facility]    CSN: [id number] Arrival date & time: 10/27/21  1202      History   Chief Complaint Chief Complaint  Patient presents with   Shoulder Injury    RT    HPI Karaleigh Hellard is a 76 y.o. female.   HPI  Patient was walking the dog.  Leash was held in her right hand.  The dog took off to greet a neighbor and pulled forcibly on her right arm.  She fell and landed on the right arm.  Is here for severe right shoulder pain.  This happened this morning.  She did take 2 Tylenol before she left home  Past Medical History:  Diagnosis Date   Anxiety    Depression    Glaucoma    Hyperlipidemia    Hypertension     Patient Active Problem List   Diagnosis Date Noted   Closed fracture dislocation of shoulder, right, initial encounter 10/27/2021   Osteopenia 08/03/2018   Closed intertrochanteric fracture of right hip status post nailing 07/26/2018   Premature atrial contraction 06/29/2018   Type 2 diabetes mellitus without complication, without long-term current use of insulin (HCC) 06/29/2018   Annual physical exam 08/19/2015   Primary osteoarthritis of right knee 08/19/2015   Renal insufficiency 08/15/2014   Panic disorder 05/23/2014   Major depressive disorder, recurrent severe without psychotic features (HCC) 08/06/2013   Hyperlipidemia 07/07/2012   Hypertension 07/05/2012    Past Surgical History:  Procedure Laterality Date   ABDOMINAL HYSTERECTOMY      OB History   No obstetric history on file.      Home Medications    Prior to Admission medications   Medication Sig Start Date End Date Taking? Authorizing Provider  amLODipine (NORVASC) 10 MG tablet Take 1 tablet (10 mg total) by mouth daily. Patient needs an appt before refills can be given. Please call office to schedule. 08/11/21   Monica Becton, MD  aspirin EC 81 MG tablet Take 1 tablet (81 mg total) by mouth daily. 08/16/20   Monica Becton, MD  Calcium Carbonate-Vitamin D  600-400 MG-UNIT tablet Take 1 tablet by mouth 2 (two) times daily. 09/11/20   Monica Becton, MD  carvedilol (COREG) 25 MG tablet TAKE 1 TABLET BY MOUTH TWICE DAILY WITH A MEAL 07/02/21   Monica Becton, MD  ezetimibe (ZETIA) 10 MG tablet Take 1 tablet (10 mg total) by mouth daily. 08/18/21   Monica Becton, MD  fenofibrate 160 MG tablet Take 1 tablet (160 mg total) by mouth daily. 03/28/21   Monica Becton, MD  ferrous sulfate 325 (65 FE) MG EC tablet Take 1 tablet (325 mg total) by mouth 3 (three) times daily with meals. 07/26/18   Monica Becton, MD  lisinopril-hydrochlorothiazide (ZESTORETIC) 10-12.5 MG tablet Take 1 tablet by mouth daily. 02/12/21   Monica Becton, MD  loratadine (CLARITIN) 10 MG tablet Take 1 tablet (10 mg total) by mouth daily. 08/14/13   Charm Rings, NP  Multiple Vitamin (MULTIVITAMIN WITH MINERALS) TABS tablet Take 1 tablet by mouth daily. 08/14/13   Charm Rings, NP  PARoxetine (PAXIL) 20 MG tablet Take 1 tablet (20 mg total) by mouth every morning. 01/17/21   Monica Becton, MD    Family History Family History  Problem Relation Age of Onset   Cancer Neg Hx    Heart attack Mother    Hypertension Mother    Hypertension Sister  Social History Social History   Tobacco Use   Smoking status: Former   Smokeless tobacco: Never   Tobacco comments:    Quit 24 years ago,  Substance Use Topics   Alcohol use: No    Comment: Very seldom-5 times a year   Drug use: No     Allergies   Antihistamines, chlorpheniramine-type; Benadryl [diphenhydramine hcl (sleep)]; and Penicillins   Review of Systems Review of Systems See HPI  Physical Exam Triage Vital Signs ED Triage Vitals  Enc Vitals Group     BP 10/27/21 1237 131/80     Pulse Rate 10/27/21 1237 (!) 48     Resp 10/27/21 1237 16     Temp 10/27/21 1241 (!) 95.4 F (35.2 C)     Temp Source 10/27/21 1237 Tympanic     SpO2 10/27/21 1237 96 %      Weight --      Height --      Head Circumference --      Peak Flow --      Pain Score 10/27/21 1238 9     Pain Loc --      Pain Edu? --      Excl. in GC? --    No data found.  Updated Vital Signs BP 131/80 (BP Location: Left Arm)    Pulse (!) 45    Temp (!) 95.4 F (35.2 C) (Tympanic)    Resp 16    SpO2 96%      Physical Exam Constitutional:      General: She is not in acute distress.    Appearance: She is well-developed.     Comments: In wheelchair.  Obvious discomfort  HENT:     Head: Normocephalic and atraumatic.     Mouth/Throat:     Comments: In mask Eyes:     Conjunctiva/sclera: Conjunctivae normal.     Pupils: Pupils are equal, round, and reactive to light.  Cardiovascular:     Rate and Rhythm: Normal rate.  Pulmonary:     Effort: Pulmonary effort is normal. No respiratory distress.  Abdominal:     General: There is no distension.     Palpations: Abdomen is soft.  Musculoskeletal:        General: Swelling present. Normal range of motion.     Cervical back: Normal range of motion.     Comments: Right shoulder is diffusely tender.  Elbow wrist and hand are normal.  No pain on collarbone or scapula.  Skin:    General: Skin is warm and dry.  Neurological:     Mental Status: She is alert.     UC Treatments / Results  Labs (all labs ordered are listed, but only abnormal results are displayed) Labs Reviewed - No data to display  EKG   Radiology DG Humerus Right  Result Date: 10/27/2021 CLINICAL DATA:  Fall, right shoulder injury EXAM: RIGHT HUMERUS - 2+ VIEW COMPARISON:  None. FINDINGS: Anterior inferior dislocation of the right humeral head at the glenohumeral joint. No right humerus fracture. No suspicious focal osseous lesions. Moderate right glenohumeral osteoarthritis with inferior right humeral head marginal osteophyte. Curvilinear soft tissue calcifications lateral to the greater tuberosity of the proximal right humerus. IMPRESSION: 1. Anterior inferior  dislocation of the right humeral head at the glenohumeral joint. No right humerus fracture. Recommend postreduction right shoulder radiographs for further evaluation. 2. Curvilinear soft tissue calcifications lateral to the greater tuberosity of the proximal right humerus, suggestive of calcific tendinopathy. 3. Moderate right  glenohumeral osteoarthritis. Electronically Signed   By: Delbert Phenix M.D.   On: 10/27/2021 13:40    Procedures Procedures (including critical care time)  Medications Ordered in UC Medications  ketorolac (TORADOL) 30 MG/ML injection 30 mg (30 mg Intravenous Given 10/27/21 1359)    Initial Impression / Assessment and Plan / UC Course  I have reviewed the triage vital signs and the nursing notes.  Pertinent labs & imaging results that were available during my care of the patient were reviewed by me and considered in my medical decision making (see chart for details).     Case discussed with Dr. Benjamin Stain.  He instructed me to order a CT scan for his review.  Based on this she was transferred over to Dr. Lucienne Minks office for reduction of her shoulder dislocation. Final Clinical Impressions(s) / UC Diagnoses   Final diagnoses:  Dislocation, shoulder closed, right, initial encounter     Discharge Instructions      Follow up with Dr Benjamin Stain     ED Prescriptions   None    PDMP not reviewed this encounter.   Eustace Moore, MD 10/27/21 1447

## 2021-10-27 NOTE — ED Notes (Signed)
Pt transferred to Dr Melvia Heaps office for RT shoulder reduction.

## 2021-10-27 NOTE — ED Triage Notes (Signed)
Pt c/o RT shoulder pain since this morning. Says she was walking her dog when the dog ran and drug her down injuring her shoulder. Pain 9/10 2 Tylenol about 1145am.

## 2021-10-27 NOTE — Progress Notes (Signed)
° ° °  Procedures performed today:    Procedure: Real-time Ultrasound Guided injection of the right glenohumeral joint Device: Samsung HS60  Verbal informed consent obtained.  Time-out conducted.  Noted no overlying erythema, induration, or other signs of local infection.  Skin prepped in a sterile fashion.  Local anesthesia: Topical Ethyl chloride.  With sterile technique and under real time ultrasound guidance: Noted dislocated shoulder, hemarthrosis, 3 cc lidocaine, 3 cc bupivacaine injected easily Completed without difficulty  Advised to call if fevers/chills, erythema, induration, drainage, or persistent bleeding.  Images permanently stored and available for review in PACS.  Impression: Technically successful ultrasound guided injection.  Procedure: Reduction anterior shoulder dislocation Risks, benefits, and alternatives explained and consent obtained. Time out conducted. Noted good sensation over the deltoid Surface prepped with chlorhexidine, I then used ultrasound guidance to inject 3 cc lidocaine, 3 cc bupivacaine into the joint hemarthrosis Adequate anesthesia ensured. Fracture reduction: Using countertraction from an assistant I applied traction to the upper extremity, I then gently externally rotated it, as we did not gain immediate reduction I then applied a laterally directed force high up in the axilla and the shoulder was felt to reduce. Post reduction films obtained showed anatomic alignment. Pt stable, aftercare and follow-up advised. Continued good sensation over the deltoid post reduction.  Independent interpretation of notes and tests performed by another provider:   Initial films reviewed, there was a anterior dislocation of the right glenohumeral joint with avulsion from the glenoid, I had requested a CT as well to ensure no fracture of the humeral head before we attempted reduction, CT also personally reviewed, no humeral head fracture, she does have some severe  osteoarthritis.  Reduction was performed and postreduction films showed anatomic alignment.  Brief History, Exam, Impression, and Recommendations:    Closed fracture dislocation of shoulder, right, initial encounter Fracture dislocation, closed reduction with local anesthesia. High-dose hydrocodone for pain, keep the sling on and return to see me in 2 weeks.  Level 5 template as we did consider hospitalization today.  ___________________________________________ [name]. Benjamin Stain, M.D., ABFM., CAQSM. Primary Care and Sports Medicine [facility]  Adjunct Instructor of Family Medicine  [employer]

## 2021-10-27 NOTE — Assessment & Plan Note (Addendum)
Fracture dislocation, closed reduction with local anesthesia. Postreduction films confirm good reduction. High-dose hydrocodone for pain, keep the sling on and return to see me in 2 weeks.

## 2021-10-28 ENCOUNTER — Other Ambulatory Visit: Payer: Medicare (Managed Care)

## 2021-10-29 ENCOUNTER — Other Ambulatory Visit: Payer: Self-pay | Admitting: Sports Medicine

## 2021-10-29 DIAGNOSIS — E7800 Pure hypercholesterolemia, unspecified: Secondary | ICD-10-CM

## 2021-11-10 ENCOUNTER — Other Ambulatory Visit: Payer: Self-pay

## 2021-11-10 ENCOUNTER — Ambulatory Visit (INDEPENDENT_AMBULATORY_CARE_PROVIDER_SITE_OTHER): Payer: Medicare (Managed Care) | Admitting: Sports Medicine

## 2021-11-10 ENCOUNTER — Ambulatory Visit: Payer: Medicare (Managed Care)

## 2021-11-10 DIAGNOSIS — S43004A Unspecified dislocation of right shoulder joint, initial encounter: Secondary | ICD-10-CM | POA: Diagnosis not present

## 2021-11-10 DIAGNOSIS — I10 Essential (primary) hypertension: Secondary | ICD-10-CM | POA: Diagnosis not present

## 2021-11-10 DIAGNOSIS — S4291XD Fracture of right shoulder girdle, part unspecified, subsequent encounter for fracture with routine healing: Secondary | ICD-10-CM

## 2021-11-10 DIAGNOSIS — S4291XA Fracture of right shoulder girdle, part unspecified, initial encounter for closed fracture: Secondary | ICD-10-CM

## 2021-11-10 DIAGNOSIS — M19011 Primary osteoarthritis, right shoulder: Secondary | ICD-10-CM | POA: Diagnosis not present

## 2021-11-10 MED ORDER — LISINOPRIL-HYDROCHLOROTHIAZIDE 10-12.5 MG PO TABS: 1.0000 | ORAL_TABLET | Freq: Every day | ORAL | 3 refills | Status: DC

## 2021-11-10 MED ORDER — HYDROCODONE-ACETAMINOPHEN 10-325 MG PO TABS: 1.0000 | ORAL_TABLET | Freq: Three times a day (TID) | ORAL | 0 refills | Status: DC | PRN

## 2021-11-10 MED ORDER — AMLODIPINE BESYLATE 10 MG PO TABS: 10.0000 mg | ORAL_TABLET | Freq: Every day | ORAL | 3 refills | Status: DC

## 2021-11-10 NOTE — Progress Notes (Signed)
° ° °  Procedures performed today:    None.  Independent interpretation of notes and tests performed by another provider:   None.  Brief History, Exam, Impression, and Recommendations:    Closed fracture dislocation of shoulder, right, initial encounter This is a very pleasant 76 year old female, we reduced the fracture dislocation about 2 weeks ago with local anesthesia. She did have some fracture fragments pre and postreduction films. She is doing really well today, she has for the most part no pain. She has expected loss of motion with external rotation only to neutral on the right side as opposed to 45 degrees on the left. We discussed discontinuing her sling, she will wear it only at night for the next week or 2. We will add formal physical therapy. We also discussed avoiding abduction and external rotation which could redislocate the shoulder. I will refill a bit more pain medication for her to use if she has some discomfort during physical therapy, updated x-rays today. We will do 2 weeks of passive motion, 2 weeks of active motion in 2 weeks of strengthening. Return to see me in 4 weeks.    ___________________________________________ [name]. Benjamin Stain, M.D., ABFM., CAQSM. Primary Care and Sports Medicine [facility]  Adjunct Instructor of Family Medicine  [employer]

## 2021-11-10 NOTE — Assessment & Plan Note (Signed)
This is a very pleasant 76 year old female, we reduced the fracture dislocation about 2 weeks ago with local anesthesia. She did have some fracture fragments pre and postreduction films. She is doing really well today, she has for the most part no pain. She has expected loss of motion with external rotation only to neutral on the right side as opposed to 45 degrees on the left. We discussed discontinuing her sling, she will wear it only at night for the next week or 2. We will add formal physical therapy. We also discussed avoiding abduction and external rotation which could redislocate the shoulder. I will refill a bit more pain medication for her to use if she has some discomfort during physical therapy, updated x-rays today. We will do 2 weeks of passive motion, 2 weeks of active motion in 2 weeks of strengthening. Return to see me in 4 weeks.

## 2021-11-12 ENCOUNTER — Encounter: Payer: Self-pay | Admitting: Rehabilitative and Restorative Service Providers"

## 2021-11-12 ENCOUNTER — Ambulatory Visit
Payer: Medicare (Managed Care) | Attending: Sports Medicine | Admitting: Rehabilitative and Restorative Service Providers"

## 2021-11-12 ENCOUNTER — Other Ambulatory Visit: Payer: Self-pay

## 2021-11-12 DIAGNOSIS — M6281 Muscle weakness (generalized): Secondary | ICD-10-CM | POA: Insufficient documentation

## 2021-11-12 DIAGNOSIS — S4291XA Fracture of right shoulder girdle, part unspecified, initial encounter for closed fracture: Secondary | ICD-10-CM | POA: Diagnosis not present

## 2021-11-12 DIAGNOSIS — R601 Generalized edema: Secondary | ICD-10-CM | POA: Diagnosis not present

## 2021-11-12 DIAGNOSIS — R29898 Other symptoms and signs involving the musculoskeletal system: Secondary | ICD-10-CM | POA: Diagnosis not present

## 2021-11-12 DIAGNOSIS — M25511 Pain in right shoulder: Secondary | ICD-10-CM | POA: Insufficient documentation

## 2021-11-12 DIAGNOSIS — R293 Abnormal posture: Secondary | ICD-10-CM | POA: Diagnosis not present

## 2021-11-12 NOTE — Patient Instructions (Signed)
Access Code: [email or web address] ?URL: [email or web address] ?Date: 11/12/2021 ?Prepared by: Corlis Leak ? ?Exercises ?Seated Cervical Retraction - 2 x daily - 7 x weekly - 1-2 sets - 5-10 reps - 10 sec hold ?Standing Scapular Retraction - 3 x daily - 7 x weekly - 1 sets - 10 reps - 10 sec hold ?Shoulder External Rotation and Scapular Retraction - 3 x daily - 7 x weekly - 1 sets - 10 reps - 3-5 sec hold ?Circular Shoulder Pendulum with Table Support - 3-4 x daily - 7 x weekly - 1 sets - 20-30 reps ?Seated Shoulder Flexion Towel Slide at Table Top Full Range of Motion - 2 x daily - 7 x weekly - 1 sets - 5-10 reps - 10sec hold ?Seated Shoulder External Rotation AAROM with Cane and Hand in Neutral - 2 x daily - 7 x weekly - 1 sets - 5-10 reps - 5-10 sec hold ?Standing Pectoral Release with Ball at Wall - 3-4 x daily - 7 x weekly ? ?

## 2021-11-12 NOTE — Therapy (Signed)
[facility] [place] [address] [place], [place], [address] Phone: [phone]   Fax:  [phone]  Physical Therapy Treatment  Patient Details  Name: Kristy Hays MRN: [id number] Date of Birth: Sep 25, 1945 Referring Provider (PT): Dr Benjamin Stain   Encounter Date: 11/12/2021   PT End of Session - 11/12/21 1247     Visit Number 1    Number of Visits 12    Date for PT Re-Evaluation 12/24/21    PT Start Time 1143    PT Stop Time 1234    PT Time Calculation (min) 51 min    Activity Tolerance Patient tolerated treatment well             Past Medical History:  Diagnosis Date   Anxiety    Depression    Glaucoma    Hyperlipidemia    Hypertension     Past Surgical History:  Procedure Laterality Date   ABDOMINAL HYSTERECTOMY      There were no vitals filed for this visit.   Subjective Assessment - 11/12/21 1145     Subjective Patient reports that she was walking her dog and he pulled her down landing on the Rt shoulder. She sustained anterior dislocation of Rt shoulder. She sustained closed fracture of head of the humerus with bone fragments in the humeral head. She was placed in the sling for ~ 2 weeks and is now out of the sling during the day but continues to wear sling at night.    Pertinent History Fx Rt hip with ORIF 2019    Diagnostic tests IMPRESSION:  No significant change in severe glenohumeral osteoarthritis. Bone density just superior to the humeral head may correspond to the  cortical fracture fragments seen on prior CT. These appeared to be  from a donor site at the superior humeral head on prior CT.    Patient Stated Goals to use Rt arm normally again    Currently in Pain? No/denies    Pain Score 0-No pain    Pain Location Shoulder    Pain Orientation Right    Pain Type Acute pain                [facility] PT Assessment - 11/12/21 0001       Assessment   Medical Diagnosis Fracture/dislocation Rt  shoulder    Referring Provider (PT) Dr Benjamin Stain    Onset Date/Surgical Date 10/27/21    Hand Dominance Right    Next MD Visit 12/08/21    Prior Therapy for fx hip      Precautions   Precautions Shoulder    Type of Shoulder Precautions sling at night      Balance Screen   Has the patient fallen in the past 6 months No    Has the patient had a decrease in activity level because of a fear of falling?  No    Is the patient reluctant to leave their home because of a fear of falling?  No      Home [employer] information centre manager residence    Living Arrangements Alone      Prior Function   Level of Independence Independent    Vocation Retired    Water engineer work retired ~ 15 years    Leisure household chores; cooking; cards and games with neighbors; walking dog daily ~ 1 mile x 2/day      Observation/Other Assessments   Focus on Therapeutic Outcomes (FOTO)  32  Observation/Other Assessments-Edema    Edema --   edema Rt shoulder girdle     Sensation   Additional Comments WNL's per pt report      Posture/Postural Control   Posture Comments head forward; shoulders rounded and elevated; Rt shoulder elevated compared to Lt      AROM   Right Shoulder Extension 30 Degrees    Right Shoulder Flexion 21 Degrees    Right Shoulder ABduction 36 Degrees    Right Shoulder Internal Rotation --   hand to sacrum   Right Shoulder External Rotation -5 Degrees   UE at side elbow bent to 90 deg flexion   Left Shoulder Extension 49 Degrees    Left Shoulder Flexion 132 Degrees    Left Shoulder ABduction 139 Degrees    Left Shoulder Internal Rotation --   thumb T10-11   Left Shoulder External Rotation 40 Degrees   UE at side elbow at 90 deg flexion     Palpation   Palpation comment muscular tightness Rt pecs; upper trap; leveator; teres; deltoid; biceps                           [facility] Adult PT Treatment/Exercise - 11/12/21 0001        Self-Care   Self-Care Other Self-Care Comments    Other Self-Care Comments  discussion of support for Rt UE in sitting; allowing Rt UE to hang at side in standing and with walking      Therapeutic Activites    Therapeutic Activities Other Therapeutic Activities    Other Therapeutic Activities myofacial ball release work 4 inch plastic ball standing - pecs and posterior shoulder      Neuro Re-ed    Neuro Re-ed Details  initiated ostural correction engaging posterior shoulder girdle musculature      Shoulder Exercises: Seated   Other Seated Exercises scap squeeze foam roll 10 sec x 5 reps Rt UE supported      Shoulder Exercises: Standing   Other Standing Exercises chin tuck 5 sec x 5; scap squeeze 10 sec x 10; L's x 10 with foam roll along spine      Shoulder Exercises: ROM/Strengthening   Pendulum circle CW/CCW x 20 each      Shoulder Exercises: Stretch   External Rotation Stretch 5 reps;10 seconds   cane - Rt elbow at side   Table Stretch - Flexion 5 reps;10 seconds      Cryotherapy   Number Minutes Cryotherapy 10 Minutes    Cryotherapy Location Shoulder    Type of Cryotherapy Ice pack                     PT Education - 11/12/21 1247     Education Details HEP POC posture and alignment    Person(s) Educated Patient    Methods Explanation;Demonstration;Tactile cues;Verbal cues;Handout    Comprehension Verbalized understanding;Returned demonstration;Verbal cues required;Tactile cues required                 PT Long Term Goals - 11/12/21 1252       PT LONG TERM GOAL #1   Title Improve posture and alignment with patient to demonstrate improved upright posture with posterior shoudler girdle musculature engaged and shoulders level in sitting and standing; Rt UE at side normally    Time 6    Period Weeks    Status New    Target Date 12/24/21      PT  LONG TERM GOAL #2   Title Increase AROM Rt shoulder to without 5 degrees of AROM Lt shoulder    Time 6     Period Weeks    Status New    Target Date 12/24/21      PT LONG TERM GOAL #3   Title Increased functional strength Rt UE with patient to report return to normal functional activities    Time 6    Period Weeks    Status New    Target Date 12/24/21      PT LONG TERM GOAL #4   Title Independent in HEP (including aquatic exercise program as indicated)    Time 6    Period Weeks    Status New    Target Date 12/24/21      PT LONG TERM GOAL #5   Title Improve functional limitation score to 59    Time 6    Period Weeks    Status New    Target Date 12/24/21                   Plan - 11/12/21 1248     Clinical Impression Statement Patient presents s/p Rt shoulder anterior dislocation and nondisplaced fracture of head of the humerus 10/27/21. She was placed in a sling ~ 2 weeks, now out of the sling during the day but remains in sling at night. Patient has poor posture and alignment; limited Rt shoulder ROM, mobility, strength, function; pain onan intermittent basis. She will benefit from PT to address problems identified and return to normal functional use of Rt UE.    Stability/Clinical Decision Making Stable/Uncomplicated    Clinical Decision Making Low    Rehab Potential Good    PT Frequency 2x / week    PT Duration 6 weeks    PT Treatment/Interventions ADLs/Self Care Home Management;Aquatic Therapy;Cryotherapy;Electrical Stimulation;Iontophoresis 4mg /ml Dexamethasone;Moist Heat;Ultrasound;Therapeutic activities;Therapeutic exercise;Neuromuscular re-education;Patient/family education;Manual techniques;Passive range of motion;Dry needling;Taping;Vasopneumatic Device    PT Next Visit Plan review and progress with HEP; add manual work and PROM/stretching Rt UE; modalities as indicated    PT Home Exercise Plan [email or web address]    Consulted and Agree with Plan of Care Patient             Patient will benefit from skilled therapeutic intervention in order to improve the following  deficits and impairments:  Decreased range of motion, Increased fascial restricitons, Decreased activity tolerance, Impaired UE functional use, Pain, Impaired flexibility, Improper body mechanics, Decreased mobility, Decreased strength, Impaired sensation, Postural dysfunction, Increased edema  Visit Diagnosis: Acute pain of right shoulder  Other symptoms and signs involving the musculoskeletal system  Abnormal posture  Muscle weakness (generalized)  Generalized edema     Problem List Patient Active Problem List   Diagnosis Date Noted   Closed fracture dislocation of shoulder, right, initial encounter 10/27/2021   Osteopenia 08/03/2018   Closed intertrochanteric fracture of right hip status post nailing 07/26/2018   Premature atrial contraction 06/29/2018   Type 2 diabetes mellitus without complication, without long-term current use of insulin (HCC) 06/29/2018   Annual physical exam 08/19/2015   Primary osteoarthritis of right knee 08/19/2015   Renal insufficiency 08/15/2014   Panic disorder 05/23/2014   Major depressive disorder, recurrent severe without psychotic features (HCC) 08/06/2013   Hyperlipidemia 07/07/2012   Hypertension 07/05/2012    Kennetha Pearman Rober Minion, PT, MPH  11/12/2021, 12:58 PM  [facility] [place] [address] [place] [address] [place], [place], [address] Phone: [phone]  Fax:  [phone]  Name: Kristy Hays MRN: [id number] Date of Birth: September 11, 1946

## 2021-11-17 ENCOUNTER — Ambulatory Visit: Payer: Medicare (Managed Care) | Admitting: Rehabilitative and Restorative Service Providers"

## 2021-11-17 ENCOUNTER — Encounter: Payer: Self-pay | Admitting: Rehabilitative and Restorative Service Providers"

## 2021-11-17 ENCOUNTER — Other Ambulatory Visit: Payer: Self-pay

## 2021-11-17 DIAGNOSIS — M6281 Muscle weakness (generalized): Secondary | ICD-10-CM

## 2021-11-17 DIAGNOSIS — R601 Generalized edema: Secondary | ICD-10-CM

## 2021-11-17 DIAGNOSIS — M25511 Pain in right shoulder: Secondary | ICD-10-CM | POA: Diagnosis not present

## 2021-11-17 DIAGNOSIS — R293 Abnormal posture: Secondary | ICD-10-CM

## 2021-11-17 DIAGNOSIS — R29898 Other symptoms and signs involving the musculoskeletal system: Secondary | ICD-10-CM

## 2021-11-17 NOTE — Patient Instructions (Signed)
Access Code: [email or web address] ?URL: [email or web address] ?Date: 11/17/2021 ?Prepared by: Corlis Leak ? ?Exercises ?Seated Cervical Retraction - 2 x daily - 7 x weekly - 1-2 sets - 5-10 reps - 10 sec hold ?Standing Scapular Retraction - 3 x daily - 7 x weekly - 1 sets - 10 reps - 10 sec hold ?Shoulder External Rotation and Scapular Retraction - 3 x daily - 7 x weekly - 1 sets - 10 reps - 3-5 sec hold ?Circular Shoulder Pendulum with Table Support - 3-4 x daily - 7 x weekly - 1 sets - 20-30 reps ?Seated Shoulder Flexion Towel Slide at Table Top Full Range of Motion - 2 x daily - 7 x weekly - 1 sets - 5-10 reps - 10sec hold ?Seated Shoulder External Rotation AAROM with Cane and Hand in Neutral - 2 x daily - 7 x weekly - 1 sets - 5-10 reps - 5-10 sec hold ?Standing Pectoral Release with Ball at Wall - 3-4 x daily - 7 x weekly ?Standing Shoulder Extension with Dowel - 2 x daily - 7 x weekly - 1 sets - 5 reps - 5-10 sec hold ?Standing Shoulder and Trunk Flexion at Table - 2 x daily - 7 x weekly - 1 sets - 5 reps - 5-10 sec hold ?Standing Backward Shoulder Rolls - 2 x daily - 7 x weekly - 1 sets - 10 reps - 1-2 sec hold ?Supine Scapular Retraction - 2 x daily - 7 x weekly - 1 sets - 10 reps - 5-10 sec hold ? ?

## 2021-11-17 NOTE — Therapy (Signed)
[facility] ?[address] [place] [address] ?[place], [place], [address] ?Phone: [phone]   Fax:  [phone] ? ?Physical Therapy Treatment ? ?Patient Details  ?Name: Kristy Hays ?MRN: [id number] ?Date of Birth: 04/21/1946 ?Referring Provider (PT): Dr Benjamin Stain ? ? ?Encounter Date: 11/17/2021 ? ? PT End of Session - 11/17/21 1104   ? ? Visit Number 2   ? Number of Visits 12   ? Date for PT Re-Evaluation 12/24/21   ? PT Start Time 1100   ? PT Stop Time 1149   ? PT Time Calculation (min) 49 min   ? Activity Tolerance Patient tolerated treatment well   ? ?  ?  ? ?  ? ? ?Past Medical History:  ?Diagnosis Date  ? Anxiety   ? Depression   ? Glaucoma   ? Hyperlipidemia   ? Hypertension   ? ? ?Past Surgical History:  ?Procedure Laterality Date  ? ABDOMINAL HYSTERECTOMY    ? ? ?There were no vitals filed for this visit. ? ? Subjective Assessment - 11/17/21 1104   ? ? Subjective A little bit of soreness and difficulty getting going in the mornings. Some problems with the cane exercise.   ? Currently in Pain? Yes   ? Pain Score 5    ? Pain Location Shoulder   ? Pain Orientation Right   ? Pain Descriptors / Indicators Aching;Sore   ? ?  ?  ? ?  ? ? ? ? ? ? ? ? ? ? ? ? ? ? ? ? ? ? ? ? [facility] Adult PT Treatment/Exercise - 11/17/21 0001   ? ?  ? Therapeutic Activites   ? Other Therapeutic Activities myofacial ball release work 4 inch plastic ball standing - pecs and posterior shoulder   ?  ? Neuro Re-ed   ? Neuro Re-ed Details  continued postural correction engaging posterior shoulder girdle musculature   ?  ? Shoulder Exercises: Supine  ? Other Supine Exercises scap squeeze 10 sec x 5 reps   ?  ? Shoulder Exercises: Seated  ? Other Seated Exercises scap squeeze foam roll 10 sec x 5 reps Rt UE supported   ?  ? Shoulder Exercises: Standing  ? Other Standing Exercises chin tuck 5 sec x 5; scap squeeze 10 sec x 10; L's x 10 with foam roll along spine   ?  ? Shoulder Exercises:  ROM/Strengthening  ? Pendulum circle CW/CCW x 20 each   ? Other ROM/Strengthening Exercises posterior shoulder rolls x 15-20   ?  ? Shoulder Exercises: Stretch  ? External Rotation Stretch 5 reps;10 seconds   cane - Rt elbow at side  ? Table Stretch - Flexion 5 reps;10 seconds   ? Table Stretch -Flexion Limitations added shoulder flexion step back hands on counter 10 sec hold x 5 reps   ? Other Shoulder Stretches shoulder extension with cane 10 sec x 5 reps   ?  ? Cryotherapy  ? Number Minutes Cryotherapy 10 Minutes   ? Cryotherapy Location Shoulder   ? Type of Cryotherapy Ice pack   ?  ? Manual Therapy  ? Joint Mobilization gentle circumduction Rt GH joint to improve tissue extensibility and mobility   ? Soft tissue mobilization soft tissue mobilization to Rt shoulder girdle   ? Scapular Mobilization scapular mobility lateral and inferior movement   ? Passive ROM PROM Rt shoulder in flexion; scaption; ER in scapular plane; gentle extension  - all motions within tissue limits and  pt tolerance   ? ?  ?  ? ?  ? ? ? ? ? ? ? ? ? ? PT Education - 11/17/21 1124   ? ? Education Details HEP   ? Person(s) Educated Patient   ? Methods Explanation;Demonstration;Tactile cues;Verbal cues;Handout   ? Comprehension Verbalized understanding;Returned demonstration;Verbal cues required;Tactile cues required   ? ?  ?  ? ?  ? ? ? ? ? ? PT Long Term Goals - 11/12/21 1252   ? ?  ? PT LONG TERM GOAL #1  ? Title Improve posture and alignment with patient to demonstrate improved upright posture with posterior shoudler girdle musculature engaged and shoulders level in sitting and standing; Rt UE at side normally   ? Time 6   ? Period Weeks   ? Status New   ? Target Date 12/24/21   ?  ? PT LONG TERM GOAL #2  ? Title Increase AROM Rt shoulder to without 5 degrees of AROM Lt shoulder   ? Time 6   ? Period Weeks   ? Status New   ? Target Date 12/24/21   ?  ? PT LONG TERM GOAL #3  ? Title Increased functional strength Rt UE with patient to report  return to normal functional activities   ? Time 6   ? Period Weeks   ? Status New   ? Target Date 12/24/21   ?  ? PT LONG TERM GOAL #4  ? Title Independent in HEP (including aquatic exercise program as indicated)   ? Time 6   ? Period Weeks   ? Status New   ? Target Date 12/24/21   ?  ? PT LONG TERM GOAL #5  ? Title Improve functional limitation score to 59   ? Time 6   ? Period Weeks   ? Status New   ? Target Date 12/24/21   ? ?  ?  ? ?  ? ? ? ? ? ? ? ? Plan - 11/17/21 1112   ? ? Clinical Impression Statement Reviewed and corrected exercises for home program. Added AAROM exercises for home and in the clinic. Added manual work and PROM with good tolerance. Patient will less guarded posture of Rt upper quarter following treatment.   ? Rehab Potential Good   ? PT Frequency 2x / week   ? PT Duration 6 weeks   ? PT Treatment/Interventions ADLs/Self Care Home Management;Aquatic Therapy;Cryotherapy;Electrical Stimulation;Iontophoresis 4mg /ml Dexamethasone;Moist Heat;Ultrasound;Therapeutic activities;Therapeutic exercise;Neuromuscular re-education;Patient/family education;Manual techniques;Passive range of motion;Dry needling;Taping;Vasopneumatic Device   ? PT Next Visit Plan review and progress with HEP; add manual work and PROM/stretching Rt UE; modalities as indicated   ? PT Home Exercise Plan [email or web address]   ? Consulted and Agree with Plan of Care Patient   ? ?  ?  ? ?  ? ? ?Patient will benefit from skilled therapeutic intervention in order to improve the following deficits and impairments:    ? ?Visit Diagnosis: ?Acute pain of right shoulder ? ?Other symptoms and signs involving the musculoskeletal system ? ?Abnormal posture ? ?Muscle weakness (generalized) ? ?Generalized edema ? ? ? ? ?Problem List ?Patient Active Problem List  ? Diagnosis Date Noted  ? Closed fracture dislocation of shoulder, right, initial encounter 10/27/2021  ? Osteopenia 08/03/2018  ? Closed intertrochanteric fracture of right hip status post  nailing 07/26/2018  ? Premature atrial contraction 06/29/2018  ? Type 2 diabetes mellitus without complication, without long-term current use of insulin (HCC) 06/29/2018  ? Annual physical  exam 08/19/2015  ? Primary osteoarthritis of right knee 08/19/2015  ? Renal insufficiency 08/15/2014  ? Panic disorder 05/23/2014  ? Major depressive disorder, recurrent severe without psychotic features (HCC) 08/06/2013  ? Hyperlipidemia 07/07/2012  ? Hypertension 07/05/2012  ? ? ?Val Riles, PT, MPH ?11/17/2021, 12:23 PM ? ?[facility] [facility] ?[address] [place] [address] ?[place], [place], [address] ?Phone: [phone]   Fax:  [phone] ? ?Name: Sehar Biswas ?MRN: [id number] ?Date of Birth: [birth year] ? ? ? ?

## 2021-11-19 ENCOUNTER — Other Ambulatory Visit: Payer: Self-pay

## 2021-11-19 ENCOUNTER — Ambulatory Visit: Payer: Medicare (Managed Care) | Admitting: Rehabilitative and Restorative Service Providers"

## 2021-11-19 ENCOUNTER — Encounter: Payer: Self-pay | Admitting: Rehabilitative and Restorative Service Providers"

## 2021-11-19 DIAGNOSIS — M25511 Pain in right shoulder: Secondary | ICD-10-CM

## 2021-11-19 DIAGNOSIS — R601 Generalized edema: Secondary | ICD-10-CM

## 2021-11-19 DIAGNOSIS — R293 Abnormal posture: Secondary | ICD-10-CM

## 2021-11-19 DIAGNOSIS — M6281 Muscle weakness (generalized): Secondary | ICD-10-CM

## 2021-11-19 DIAGNOSIS — R29898 Other symptoms and signs involving the musculoskeletal system: Secondary | ICD-10-CM

## 2021-11-19 NOTE — Patient Instructions (Signed)
Access Code: [email or web address] ?URL: [email or web address] ?Date: 11/19/2021 ?Prepared by: Corlis Leak ? ?Exercises ?Seated Cervical Retraction - 2 x daily - 7 x weekly - 1-2 sets - 5-10 reps - 10 sec hold ?Standing Scapular Retraction - 3 x daily - 7 x weekly - 1 sets - 10 reps - 10 sec hold ?Shoulder External Rotation and Scapular Retraction - 3 x daily - 7 x weekly - 1 sets - 10 reps - 3-5 sec hold ?Circular Shoulder Pendulum with Table Support - 3-4 x daily - 7 x weekly - 1 sets - 20-30 reps ?Seated Shoulder Flexion Towel Slide at Table Top Full Range of Motion - 2 x daily - 7 x weekly - 1 sets - 5-10 reps - 10sec hold ?Seated Shoulder External Rotation AAROM with Cane and Hand in Neutral - 2 x daily - 7 x weekly - 1 sets - 5-10 reps - 5-10 sec hold ?Standing Pectoral Release with Ball at Wall - 3-4 x daily - 7 x weekly ?Standing Shoulder Extension with Dowel - 2 x daily - 7 x weekly - 1 sets - 5 reps - 5-10 sec hold ?Standing Shoulder and Trunk Flexion at Table - 2 x daily - 7 x weekly - 1 sets - 5 reps - 5-10 sec hold ?Standing Backward Shoulder Rolls - 2 x daily - 7 x weekly - 1 sets - 10 reps - 1-2 sec hold ?Supine Scapular Retraction - 2 x daily - 7 x weekly - 1 sets - 10 reps - 5-10 sec hold ?Standing Bilateral Shoulder Internal Rotation AAROM with Dowel - 2 x daily - 7 x weekly - 1 sets - 5-10 reps - 10 sec hold ?Standing Shoulder Internal Rotation Stretch with Towel - 2 x daily - 7 x weekly - 1 sets - 3-5 reps - 10-15 hold ?Shoulder Flexion Wall Slide with Towel - 2 x daily - 7 x weekly - 1 sets - 5-10 reps - 10 sec hold ?Supine Single Arm Shoulder Protraction - 2 x daily - 7 x weekly - 1 sets - 5 reps - 2-3 sec hold ?Ball bnt hands  ?

## 2021-11-19 NOTE — Therapy (Signed)
[place] [facility] ?[address] [place] [address] ?[place], [place], [address] ?Phone: [phone]   Fax:  [phone] ? ?Physical Therapy Treatment ? ?Patient Details  ?Name: Kristy Hays ?MRN: [id number] ?Date of Birth: 06-Sep-1946 ?Referring Provider (PT): Dr Benjamin Stain ? ? ?Encounter Date: 11/19/2021 ? ? PT End of Session - 11/19/21 1103   ? ? Visit Number 3   ? Number of Visits 12   ? Date for PT Re-Evaluation 12/24/21   ? PT Start Time 1100   ? PT Stop Time 1148   ? PT Time Calculation (min) 48 min   ? ?  ?  ? ?  ? ? ?Past Medical History:  ?Diagnosis Date  ? Anxiety   ? Depression   ? Glaucoma   ? Hyperlipidemia   ? Hypertension   ? ? ?Past Surgical History:  ?Procedure Laterality Date  ? ABDOMINAL HYSTERECTOMY    ? ? ?There were no vitals filed for this visit. ? ? Subjective Assessment - 11/19/21 1104   ? ? Subjective Some soreness following last visit but thinks the shoulder is feeling better overall.   ? Currently in Pain? No/denies   ? Pain Score 0-No pain   ? Pain Location Shoulder   ? Pain Orientation Right   ? ?  ?  ? ?  ? ? ? ? ? ? ? ? ? ? ? ? ? ? ? ? ? ? ? ? [facility] Adult PT Treatment/Exercise - 11/19/21 0001   ? ?  ? Shoulder Exercises: Supine  ? Other Supine Exercises scap squeeze 10 sec x 5 reps   ? Other Supine Exercises punch hand to ceiling hold 2-3 sec slowly lower bending elbow x 5 reps   ?  ? Shoulder Exercises: Seated  ? Other Seated Exercises scap squeeze foam roll 10 sec x 5 reps Rt UE supported   ?  ? Shoulder Exercises: Standing  ? Other Standing Exercises chin tuck 5 sec x 5; scap squeeze 10 sec x 10; L's x 10 with foam roll along spine   ?  ? Shoulder Exercises: ROM/Strengthening  ? Pendulum circle CW/CCW x 20 each   ? Other ROM/Strengthening Exercises posterior shoulder rolls x 15-20   ? Other ROM/Strengthening Exercises ball btn hands for elbow flexion/ext; wood chop each side; chest press; sterring wheel;   ?  ? Shoulder Exercises: Stretch  ? Internal  Rotation Stretch 5 reps   ? Internal Rotation Stretch Limitations 10 sec hold pulling cane up back; repeated with pillowcase   ? External Rotation Stretch 5 reps;10 seconds   cane - Rt elbow at side  ? Wall Stretch - Flexion 5 reps;10 seconds   ? Table Stretch - Flexion 5 reps;10 seconds   ? Table Stretch -Flexion Limitations added shoulder flexion step back hands on counter 10 sec hold x 5 reps   ? Other Shoulder Stretches shoulder extension with cane 10 sec x 5 reps   ?  ? Cryotherapy  ? Number Minutes Cryotherapy 10 Minutes   ? Cryotherapy Location Shoulder   ? Type of Cryotherapy Ice pack   ?  ? Manual Therapy  ? Joint Mobilization gentle circumduction Rt GH joint to improve tissue extensibility and mobility   ? Soft tissue mobilization soft tissue mobilization to Rt shoulder girdle   ? Scapular Mobilization scapular mobility lateral and inferior movement   ? Passive ROM PROM Rt shoulder in flexion; scaption; ER in scapular plane; gentle extension  - all motions  within tissue limits and pt tolerance   ? ?  ?  ? ?  ? ? ? ? ? ? ? ? ? ? PT Education - 11/19/21 1148   ? ? Education Details HEP   ? Person(s) Educated Patient   ? Methods Explanation;Demonstration;Tactile cues;Verbal cues;Handout   ? Comprehension Verbalized understanding;Returned demonstration;Verbal cues required;Tactile cues required   ? ?  ?  ? ?  ? ? ? ? ? ? PT Long Term Goals - 11/12/21 1252   ? ?  ? PT LONG TERM GOAL #1  ? Title Improve posture and alignment with patient to demonstrate improved upright posture with posterior shoudler girdle musculature engaged and shoulders level in sitting and standing; Rt UE at side normally   ? Time 6   ? Period Weeks   ? Status New   ? Target Date 12/24/21   ?  ? PT LONG TERM GOAL #2  ? Title Increase AROM Rt shoulder to without 5 degrees of AROM Lt shoulder   ? Time 6   ? Period Weeks   ? Status New   ? Target Date 12/24/21   ?  ? PT LONG TERM GOAL #3  ? Title Increased functional strength Rt UE with  patient to report return to normal functional activities   ? Time 6   ? Period Weeks   ? Status New   ? Target Date 12/24/21   ?  ? PT LONG TERM GOAL #4  ? Title Independent in HEP (including aquatic exercise program as indicated)   ? Time 6   ? Period Weeks   ? Status New   ? Target Date 12/24/21   ?  ? PT LONG TERM GOAL #5  ? Title Improve functional limitation score to 59   ? Time 6   ? Period Weeks   ? Status New   ? Target Date 12/24/21   ? ?  ?  ? ?  ? ? ? ? ? ? ? ? Plan - 11/19/21 1105   ? ? Clinical Impression Statement Some soreness following last visit but resolved in a day or so, No pain today. Working on her exercises at home and notices that she is doing more with her Rt UE more for things at waist level at home now. Continued with exercise and manual work with PROM/stretching. Note gradual porgress with mobility and ROM   ? Rehab Potential Good   ? PT Frequency 2x / week   ? PT Duration 6 weeks   ? PT Treatment/Interventions ADLs/Self Care Home Management;Aquatic Therapy;Cryotherapy;Electrical Stimulation;Iontophoresis 4mg /ml Dexamethasone;Moist Heat;Ultrasound;Therapeutic activities;Therapeutic exercise;Neuromuscular re-education;Patient/family education;Manual techniques;Passive range of motion;Dry needling;Taping;Vasopneumatic Device   ? PT Next Visit Plan review and progress with HEP; continue manual work and PROM/stretching Rt UE; modalities as indicated   ? PT Home Exercise Plan [email or web address]   ? Consulted and Agree with Plan of Care Patient   ? ?  ?  ? ?  ? ? ?Patient will benefit from skilled therapeutic intervention in order to improve the following deficits and impairments:    ? ?Visit Diagnosis: ?Acute pain of right shoulder ? ?Other symptoms and signs involving the musculoskeletal system ? ?Abnormal posture ? ?Muscle weakness (generalized) ? ?Generalized edema ? ? ? ? ?Problem List ?Patient Active Problem List  ? Diagnosis Date Noted  ? Closed fracture dislocation of shoulder, right, initial  encounter 10/27/2021  ? Osteopenia 08/03/2018  ? Closed intertrochanteric fracture of right hip status post nailing 07/26/2018  ?  Premature atrial contraction 06/29/2018  ? Type 2 diabetes mellitus without complication, without long-term current use of insulin (HCC) 06/29/2018  ? Annual physical exam 08/19/2015  ? Primary osteoarthritis of right knee 08/19/2015  ? Renal insufficiency 08/15/2014  ? Panic disorder 05/23/2014  ? Major depressive disorder, recurrent severe without psychotic features (HCC) 08/06/2013  ? Hyperlipidemia 07/07/2012  ? Hypertension 07/05/2012  ? ? ?Val Riles, PT, MPH ?11/19/2021, 11:48 AM ? ?[place] [facility] ?[address] [place] [address] ?[place], [place], [address] ?Phone: [phone]   Fax:  [phone] ? ?Name: Abbigal Radich ?MRN: [id number] ?Date of Birth: August 24, 1946 ? ? ? ?

## 2021-11-25 ENCOUNTER — Encounter: Payer: Self-pay | Admitting: Rehabilitative and Restorative Service Providers"

## 2021-11-25 ENCOUNTER — Ambulatory Visit: Payer: Medicare (Managed Care) | Admitting: Rehabilitative and Restorative Service Providers"

## 2021-11-25 ENCOUNTER — Other Ambulatory Visit: Payer: Self-pay

## 2021-11-25 DIAGNOSIS — M25511 Pain in right shoulder: Secondary | ICD-10-CM

## 2021-11-25 DIAGNOSIS — R29898 Other symptoms and signs involving the musculoskeletal system: Secondary | ICD-10-CM

## 2021-11-25 DIAGNOSIS — R293 Abnormal posture: Secondary | ICD-10-CM

## 2021-11-25 DIAGNOSIS — M6281 Muscle weakness (generalized): Secondary | ICD-10-CM

## 2021-11-25 DIAGNOSIS — R601 Generalized edema: Secondary | ICD-10-CM

## 2021-11-25 NOTE — Patient Instructions (Addendum)
Trigger Point Dry Needling ? ?What is Trigger Point Dry Needling (DN)? ?DN is a physical therapy technique used to treat muscle pain and dysfunction. Specifically, DN helps deactivate muscle trigger points (muscle knots).  ?A thin filiform needle is used to penetrate the skin and stimulate the underlying trigger point. The goal is for a local twitch response (LTR) to occur and for the trigger point to relax. No medication of any kind is injected during the procedure.  ? ?What Does Trigger Point Dry Needling Feel Like?  ?The procedure feels different for each individual patient. Some patients report that they do not actually feel the needle enter the skin and overall the process is not painful. Very mild bleeding may occur. However, many patients feel a deep cramping in the muscle in which the needle was inserted. This is the local twitch response.  ? ?How Will I feel after the treatment? ?Soreness is normal, and the onset of soreness may not occur for a few hours. Typically this soreness does not last longer than two days.  ?Bruising is uncommon, however; ice can be used to decrease any possible bruising.  ?In rare cases feeling tired or nauseous after the treatment is normal. In addition, your symptoms may get worse before they get better, this period will typically not last longer than 24 hours.  ? ?What Can I do After My Treatment? ?Increase your hydration by drinking more water for the next 24 hours. ?You may place ice or heat on the areas treated that have become sore, however, do not use heat on inflamed or bruised areas. Heat often brings more relief post needling. ?You can continue your regular activities, but vigorous activity is not recommended initially after the treatment for 24 hours. ?DN is best combined with other physical therapy such as strengthening, stretching, and other therapies.  ? ?Access Code: [email or web address] ?URL: [email or web address] ?Date: 11/25/2021 ?Prepared by: Corlis Leak ? ?Exercises ?Seated Cervical Retraction - 2 x daily - 7 x weekly - 1-2 sets - 5-10 reps - 10 sec hold ?Standing Scapular Retraction - 3 x daily - 7 x weekly - 1 sets - 10 reps - 10 sec hold ?Shoulder External Rotation and Scapular Retraction - 3 x daily - 7 x weekly - 1 sets - 10 reps - 3-5 sec hold ?Circular Shoulder Pendulum with Table Support - 3-4 x daily - 7 x weekly - 1 sets - 20-30 reps ?Seated Shoulder Flexion Towel Slide at Table Top Full Range of Motion - 2 x daily - 7 x weekly - 1 sets - 5-10 reps - 10sec hold ?Seated Shoulder External Rotation AAROM with Cane and Hand in Neutral - 2 x daily - 7 x weekly - 1 sets - 5-10 reps - 5-10 sec hold ?Standing Pectoral Release with Ball at Wall - 3-4 x daily - 7 x weekly ?Standing Shoulder Extension with Dowel - 2 x daily - 7 x weekly - 1 sets - 5 reps - 5-10 sec hold ?Standing Shoulder and Trunk Flexion at Table - 2 x daily - 7 x weekly - 1 sets - 5 reps - 5-10 sec hold ?Standing Backward Shoulder Rolls - 2 x daily - 7 x weekly - 1 sets - 10 reps - 1-2 sec hold ?Supine Scapular Retraction - 2 x daily - 7 x weekly - 1 sets - 10 reps - 5-10 sec hold ?Standing Bilateral Shoulder Internal Rotation AAROM with Dowel - 2 x daily - 7 x weekly - 1 sets -  5-10 reps - 10 sec hold ?Standing Shoulder Internal Rotation Stretch with Towel - 2 x daily - 7 x weekly - 1 sets - 3-5 reps - 10-15 hold ?Shoulder Flexion Wall Slide with Towel - 2 x daily - 7 x weekly - 1 sets - 5-10 reps - 10 sec hold ?Supine Single Arm Shoulder Protraction - 2 x daily - 7 x weekly - 1 sets - 5 reps - 2-3 sec hold ?Seated Shoulder Flexion AAROM with Pulley Behind - 2 x daily - 7 x weekly - 1 sets - 10 reps - 10 sec hold ?Seated Shoulder Scaption AAROM with Pulley at Side - 2 x daily - 7 x weekly - 1 sets - 10 reps - 10sec hold ?Supine Shoulder Flexion AAROM - 2 x daily - 7 x weekly - 1 sets - 5 reps - 5-10 sec hold ?Supine Shoulder Circles - 1 x daily - 7 x weekly - 1 sets - 1-25 reps ?

## 2021-11-25 NOTE — Therapy (Signed)
[place] [facility] ?[address] ?[place], [place], [address] ?Phone: [phone]   Fax:  [phone] ? ?Physical Therapy Treatment ? ?Patient Details  ?Name: Kristy Hays ?MRN: [id number] ?Date of Birth: 23-Apr-1946 ?Referring Provider (PT): Dr Benjamin Stain ? ? ?Encounter Date: 11/25/2021 ? ? PT End of Session - 11/25/21 1152   ? ? Visit Number 4   ? Number of Visits 12   ? Date for PT Re-Evaluation 12/24/21   ? PT Start Time 1150   ? PT Stop Time 1238   ? PT Time Calculation (min) 48 min   ? Activity Tolerance Patient tolerated treatment well   ? ?  ?  ? ?  ? ? ?Past Medical History:  ?Diagnosis Date  ? Anxiety   ? Depression   ? Glaucoma   ? Hyperlipidemia   ? Hypertension   ? ? ?Past Surgical History:  ?Procedure Laterality Date  ? ABDOMINAL HYSTERECTOMY    ? ? ?There were no vitals filed for this visit. ? ? Subjective Assessment - 11/25/21 1154   ? ? Subjective Continued soreness in the Rt shoulder. Can tell the shoulder is loosening up some.   ? Currently in Pain? Yes   not pain - just soreness, both arms get sore  ? Pain Score 5    ? Pain Location Shoulder   ? Pain Orientation Right   ? Pain Descriptors / Indicators Sore   ? Pain Type Acute pain   ? ?  ?  ? ?  ? ? ? ? ? [facility] PT Assessment - 11/25/21 0001   ? ?  ? Assessment  ? Medical Diagnosis Fracture/dislocation Rt shoulder   ? Referring Provider (PT) Dr Benjamin Stain   ? Onset Date/Surgical Date 10/27/21   ? Hand Dominance Right   ? Next MD Visit 12/08/21   ? Prior Therapy for fx hip   ?  ? PROM  ? Right Shoulder Extension 49 Degrees   ? Right Shoulder Flexion 117 Degrees   ? Right Shoulder ABduction 125 Degrees   in scapular plane  ? Right Shoulder Internal Rotation 54 Degrees   in scapular plane  ? Right Shoulder External Rotation 42 Degrees   in scapular plane  ? ?  ?  ? ?  ? ? ? ? ? ? ? ? ? ? ? ? ? ? ? ? [facility] Adult PT Treatment/Exercise - 11/25/21 0001   ? ?  ? Neuro Re-ed   ? Neuro Re-ed Details  continued  postural correction engaging posterior shoulder girdle musculature improving alignment and quality of movement Rt UE   ?  ? Shoulder Exercises: Supine  ? Flexion Limitations small range flexion; horizontal ab/add; circles CW/CCW x ~ 10 each   ? Other Supine Exercises scap squeeze 10 sec x 5 reps   ? Other Supine Exercises punch hand to ceiling hold 2-3 sec slowly lower bending elbow x 5 reps   ?  ? Shoulder Exercises: Seated  ? Other Seated Exercises scap squeeze foam roll 10 sec x 5 reps Rt UE supported   ?  ? Shoulder Exercises: Pulleys  ? Flexion Limitations 10 sec hold x 10 reps   ? Scaption Limitations 10 sec x 10 reps   ? Other Pulley Exercises holding shoulder in some flexion and abduction for opening and closing - horizontal ab/add x 10 reps   ?  ? Shoulder Exercises: ROM/Strengthening  ? Other ROM/Strengthening Exercises posterior shoulder rolls x 15-20   ?  Other ROM/Strengthening Exercises ball btn hands for elbow flexion/ext; wood chop each side; chest press; sterring wheel;   ?  ? Moist Heat Therapy  ? Number Minutes Moist Heat 10 Minutes   ? Moist Heat Location Shoulder   ?  ? Manual Therapy  ? Joint Mobilization gentle circumduction Rt GH joint to improve tissue extensibility and mobility   ? Soft tissue mobilization soft tissue mobilization to Rt shoulder girdle   ? Scapular Mobilization scapular mobility lateral and inferior movement   ? Passive ROM PROM Rt shoulder in flexion; scaption; ER in scapular plane; gentle extension  - all motions within tissue limits and pt tolerance   ? ?  ?  ? ?  ? ? ? ? ? ? ? ? ? ? PT Education - 11/25/21 1237   ? ? Education Details HEP, DN   ? Person(s) Educated Patient   ? Methods Explanation;Demonstration;Tactile cues;Verbal cues;Handout   ? Comprehension Verbalized understanding;Returned demonstration;Verbal cues required;Tactile cues required   ? ?  ?  ? ?  ? ? ? ? ? ? PT Long Term Goals - 11/12/21 1252   ? ?  ? PT LONG TERM GOAL #1  ? Title Improve posture and  alignment with patient to demonstrate improved upright posture with posterior shoudler girdle musculature engaged and shoulders level in sitting and standing; Rt UE at side normally   ? Time 6   ? Period Weeks   ? Status New   ? Target Date 12/24/21   ?  ? PT LONG TERM GOAL #2  ? Title Increase AROM Rt shoulder to without 5 degrees of AROM Lt shoulder   ? Time 6   ? Period Weeks   ? Status New   ? Target Date 12/24/21   ?  ? PT LONG TERM GOAL #3  ? Title Increased functional strength Rt UE with patient to report return to normal functional activities   ? Time 6   ? Period Weeks   ? Status New   ? Target Date 12/24/21   ?  ? PT LONG TERM GOAL #4  ? Title Independent in HEP (including aquatic exercise program as indicated)   ? Time 6   ? Period Weeks   ? Status New   ? Target Date 12/24/21   ?  ? PT LONG TERM GOAL #5  ? Title Improve functional limitation score to 59   ? Time 6   ? Period Weeks   ? Status New   ? Target Date 12/24/21   ? ?  ?  ? ?  ? ? ? ? ? ? ? ? Plan - 11/25/21 1156   ? ? Clinical Impression Statement Continued soreness in the Rt shoulder. She is working on her exercises at home. Continued work with exercises in the clinic, manual work, PROM Rt shoulder. Gradually progressing toward stated goals of therapy. May benefit from trail of DN   ? Rehab Potential Good   ? PT Frequency 2x / week   ? PT Duration 6 weeks   ? PT Treatment/Interventions ADLs/Self Care Home Management;Aquatic Therapy;Cryotherapy;Electrical Stimulation;Iontophoresis 4mg /ml Dexamethasone;Moist Heat;Ultrasound;Therapeutic activities;Therapeutic exercise;Neuromuscular re-education;Patient/family education;Manual techniques;Passive range of motion;Dry needling;Taping;Vasopneumatic Device   ? PT Next Visit Plan review and progress with HEP; continue manual work and PROM/stretching Rt UE; modalities as indicated; DN   ? PT Home Exercise Plan [id number]   ? Consulted and Agree with Plan of Care Patient   ? ?  ?  ? ?  ? ? ?  Patient will  benefit from skilled therapeutic intervention in order to improve the following deficits and impairments:    ? ?Visit Diagnosis: ?Acute pain of right shoulder ? ?Other symptoms and signs involving the musculoskeletal system ? ?Abnormal posture ? ?Muscle weakness (generalized) ? ?Generalized edema ? ? ? ? ?Problem List ?Patient Active Problem List  ? Diagnosis Date Noted  ? Closed fracture dislocation of shoulder, right, initial encounter 10/27/2021  ? Osteopenia 08/03/2018  ? Closed intertrochanteric fracture of right hip status post nailing 07/26/2018  ? Premature atrial contraction 06/29/2018  ? Type 2 diabetes mellitus without complication, without long-term current use of insulin (HCC) 06/29/2018  ? Annual physical exam 08/19/2015  ? Primary osteoarthritis of right knee 08/19/2015  ? Renal insufficiency 08/15/2014  ? Panic disorder 05/23/2014  ? Major depressive disorder, recurrent severe without psychotic features (HCC) 08/06/2013  ? Hyperlipidemia 07/07/2012  ? Hypertension 07/05/2012  ? ? ?Kristy Hays, PT, MPH ?11/25/2021, 12:42 PM ? ?[place] [facility] ?[address] ?[place], [place], [address] ?Phone: [phone]   Fax:  [phone] ? ?Name: Kristy Hays ?MRN: [id number] ?Date of Birth: Oct 14, 1945 ? ? ? ?

## 2021-11-28 ENCOUNTER — Other Ambulatory Visit: Payer: Self-pay

## 2021-11-28 ENCOUNTER — Ambulatory Visit: Payer: Medicare (Managed Care) | Admitting: Rehabilitative and Restorative Service Providers"

## 2021-11-28 ENCOUNTER — Encounter: Payer: Self-pay | Admitting: Rehabilitative and Restorative Service Providers"

## 2021-11-28 DIAGNOSIS — M25511 Pain in right shoulder: Secondary | ICD-10-CM

## 2021-11-28 DIAGNOSIS — M6281 Muscle weakness (generalized): Secondary | ICD-10-CM

## 2021-11-28 DIAGNOSIS — E7800 Pure hypercholesterolemia, unspecified: Secondary | ICD-10-CM | POA: Diagnosis not present

## 2021-11-28 DIAGNOSIS — R601 Generalized edema: Secondary | ICD-10-CM

## 2021-11-28 DIAGNOSIS — R293 Abnormal posture: Secondary | ICD-10-CM

## 2021-11-28 DIAGNOSIS — R29898 Other symptoms and signs involving the musculoskeletal system: Secondary | ICD-10-CM

## 2021-11-28 NOTE — Therapy (Signed)
[place] ?[facility] ?[address] ?[place], [place], [address] ?Phone: [phone]   Fax:  [phone] ? ?Physical Therapy Treatment ? ?Patient Details  ?Name: Kristy Hays ?MRN: [id number] ?Date of Birth: Oct 29, 1945 ?Referring Provider (PT): Dr Benjamin Stain ? ? ?Encounter Date: 11/28/2021 ? ? PT End of Session - 11/28/21 0756   ? ? Visit Number 5   ? Number of Visits 12   ? Date for PT Re-Evaluation 12/24/21   ? PT Start Time [phone]   ? PT Stop Time 0845   ? PT Time Calculation (min) 50 min   ? Activity Tolerance Patient tolerated treatment well   ? ?  ?  ? ?  ? ? ?Past Medical History:  ?Diagnosis Date  ? Anxiety   ? Depression   ? Glaucoma   ? Hyperlipidemia   ? Hypertension   ? ? ?Past Surgical History:  ?Procedure Laterality Date  ? ABDOMINAL HYSTERECTOMY    ? ? ?There were no vitals filed for this visit. ? ? Subjective Assessment - 11/28/21 0756   ? ? Subjective Less soreness in the shoulder today. She did decrease her exercises a bit in the past two days. Pulley came yesterday but she has not gotten it up yet.   ? Currently in Pain? No/denies   ? Pain Score 0-No pain   ? Pain Location Shoulder   ? ?  ?  ? ?  ? ? ? ? ? ? ? ? ? ? ? ? ? ? ? ? ? ? ? ? [facility] Adult PT Treatment/Exercise - 11/28/21 0001   ? ?  ? Shoulder Exercises: Pulleys  ? Flexion Limitations 10 sec hold x 10 reps   ? Scaption Limitations 10 sec x 10 reps   ? Other Pulley Exercises holding shoulder in some flexion and abduction for opening and closing - horizontal ab/add x 10 reps   ?  ? Shoulder Exercises: ROM/Strengthening  ? Pendulum circle CW/CCW x 20 each   ? Other ROM/Strengthening Exercises posterior shoulder rolls x 15-20   ?  ? Shoulder Exercises: Stretch  ? Table Stretch - Flexion 5 reps;10 seconds   ? Table Stretch -Flexion Limitations shoulder flexion step back hands on counter 10 sec hold x 5 reps   ? Other Shoulder Stretches shoulder extension with cane 10 sec x 5 reps   ?  ? Moist Heat  Therapy  ? Number Minutes Moist Heat 10 Minutes   ? Moist Heat Location Shoulder   ?  ? Manual Therapy  ? Manual therapy comments skilled palpation to assess response to DN and manual work   ? Joint Mobilization gentle circumduction Rt GH joint to improve tissue extensibility and mobility   ? Soft tissue mobilization soft tissue mobilization to Rt shoulder girdle   ? Scapular Mobilization scapular mobility lateral and inferior movement   ? Passive ROM PROM Rt shoulder in flexion; scaption; ER in scapular plane; gentle extension  - all motions within tissue limits and pt tolerance   ? ?  ?  ? ?  ? ? ? Trigger Point Dry Needling - 11/28/21 0001   ? ? Consent Given? Yes   ? Education Handout Provided Yes   ? Dry Needling Comments Rt   ? Upper Trapezius Response Palpable increased muscle length;Twitch reponse elicited   ? Pectoralis Major Response Palpable increased muscle length;Twitch response elicited   ? Pectoralis Minor Response Palpable increased muscle length;Twitch response elicited   ? Deltoid Response  Palpable increased muscle length   ? Teres major Response Palpable increased muscle length   ? Teres minor Response Palpable increased muscle length   ? Biceps Response Palpable increased muscle length   ? ?  ?  ? ?  ? ? ? ? ? ? ? ? PT Education - 11/28/21 0800   ? ? Education Details DN   ? Person(s) Educated Patient   ? Methods Explanation;Demonstration;Tactile cues;Verbal cues;Handout   ? Comprehension Verbalized understanding;Returned demonstration;Verbal cues required;Tactile cues required   ? ?  ?  ? ?  ? ? ? ? ? ? PT Long Term Goals - 11/12/21 1252   ? ?  ? PT LONG TERM GOAL #1  ? Title Improve posture and alignment with patient to demonstrate improved upright posture with posterior shoudler girdle musculature engaged and shoulders level in sitting and standing; Rt UE at side normally   ? Time 6   ? Period Weeks   ? Status New   ? Target Date 12/24/21   ?  ? PT LONG TERM GOAL #2  ? Title Increase AROM Rt  shoulder to without 5 degrees of AROM Lt shoulder   ? Time 6   ? Period Weeks   ? Status New   ? Target Date 12/24/21   ?  ? PT LONG TERM GOAL #3  ? Title Increased functional strength Rt UE with patient to report return to normal functional activities   ? Time 6   ? Period Weeks   ? Status New   ? Target Date 12/24/21   ?  ? PT LONG TERM GOAL #4  ? Title Independent in HEP (including aquatic exercise program as indicated)   ? Time 6   ? Period Weeks   ? Status New   ? Target Date 12/24/21   ?  ? PT LONG TERM GOAL #5  ? Title Improve functional limitation score to 59   ? Time 6   ? Period Weeks   ? Status New   ? Target Date 12/24/21   ? ?  ?  ? ?  ? ? ? ? ? ? ? ? Plan - 11/28/21 0758   ? ? Clinical Impression Statement Less soreness. Patient continues to exhibit scapular dyskinesis with Lt shoulder elevation. Trial of DN to address muscular tightness in the shoulder girdle. Good response to DN and manual work with improved tissue extensibility. Continue with ther ex and HEP.   ? Rehab Potential Good   ? PT Frequency 2x / week   ? PT Duration 6 weeks   ? PT Treatment/Interventions ADLs/Self Care Home Management;Aquatic Therapy;Cryotherapy;Electrical Stimulation;Iontophoresis 4mg /ml Dexamethasone;Moist Heat;Ultrasound;Therapeutic activities;Therapeutic exercise;Neuromuscular re-education;Patient/family education;Manual techniques;Passive range of motion;Dry needling;Taping;Vasopneumatic Device   ? PT Next Visit Plan review and progress with HEP; continue manual work and PROM/stretching Rt UE; modalities as indicated; assess response to DN   ? PT Home Exercise Plan [id number]   ? Consulted and Agree with Plan of Care Patient   ? ?  ?  ? ?  ? ? ?Patient will benefit from skilled therapeutic intervention in order to improve the following deficits and impairments:    ? ?Visit Diagnosis: ?Acute pain of right shoulder ? ?Other symptoms and signs involving the musculoskeletal system ? ?Abnormal posture ? ?Muscle weakness  (generalized) ? ?Generalized edema ? ? ? ? ?Problem List ?Patient Active Problem List  ? Diagnosis Date Noted  ? Closed fracture dislocation of shoulder, right, initial encounter 10/27/2021  ? Osteopenia 08/03/2018  ?  Closed intertrochanteric fracture of right hip status post nailing 07/26/2018  ? Premature atrial contraction 06/29/2018  ? Type 2 diabetes mellitus without complication, without long-term current use of insulin (HCC) 06/29/2018  ? Annual physical exam 08/19/2015  ? Primary osteoarthritis of right knee 08/19/2015  ? Renal insufficiency 08/15/2014  ? Panic disorder 05/23/2014  ? Major depressive disorder, recurrent severe without psychotic features (HCC) 08/06/2013  ? Hyperlipidemia 07/07/2012  ? Hypertension 07/05/2012  ? ? ?Val Rileselyn P Gianne Shugars, PT, MPH  ?11/28/2021, 8:44 AM ? ?[place] ?[facility] ?[address] ?[place], [place], [address] ?Phone: [phone]   Fax:  [phone] ? ?Name: Rochele RaringGay Graven ?MRN: [id number] ?Date of Birth: 03-Dec-1945 ? ? ? ?

## 2021-11-28 NOTE — Patient Instructions (Signed)

## 2021-11-29 LAB — LIPID PANEL
Cholesterol: 186 mg/dL (ref <200)
HDL: 60 mg/dL (ref >=50)
LDL Cholesterol (Calc): 108 mg/dL (calc) — ABNORMAL HIGH
Non-HDL Cholesterol (Calc): 126 mg/dL (calc) (ref <130)
Total CHOL/HDL Ratio: 3.1 (calc) (ref <5.0)
Triglycerides: 90 mg/dL (ref <150)

## 2021-12-02 ENCOUNTER — Other Ambulatory Visit: Payer: Self-pay

## 2021-12-02 ENCOUNTER — Ambulatory Visit: Payer: Medicare (Managed Care) | Admitting: Physical Therapy

## 2021-12-02 DIAGNOSIS — R29898 Other symptoms and signs involving the musculoskeletal system: Secondary | ICD-10-CM

## 2021-12-02 DIAGNOSIS — R293 Abnormal posture: Secondary | ICD-10-CM

## 2021-12-02 DIAGNOSIS — M6281 Muscle weakness (generalized): Secondary | ICD-10-CM

## 2021-12-02 DIAGNOSIS — M25511 Pain in right shoulder: Secondary | ICD-10-CM | POA: Diagnosis not present

## 2021-12-02 DIAGNOSIS — R601 Generalized edema: Secondary | ICD-10-CM

## 2021-12-02 NOTE — Therapy (Signed)
[place] ?[facility] ?[address] ?[place], [place], [address] ?Phone: [phone]   Fax:  [phone] ? ?Physical Therapy Treatment ? ?Patient Details  ?Name: Kristy Hays ?MRN: [id number] ?Date of Birth: 1945/12/06 ?Referring Provider (PT): Dr Benjamin Stainhekkekandam ? ? ?Encounter Date: 12/02/2021 ? ? PT End of Session - 12/02/21 0840   ? ? Visit Number 6   ? Number of Visits 12   ? Date for PT Re-Evaluation 12/24/21   ? PT Start Time 0800   ? PT Stop Time 0845   ? PT Time Calculation (min) 45 min   ? Activity Tolerance Patient tolerated treatment well   ? Behavior [facility] for tasks assessed/performed   ? ?  ?  ? ?  ? ? ?Past Medical History:  ?Diagnosis Date  ? Anxiety   ? Depression   ? Glaucoma   ? Hyperlipidemia   ? Hypertension   ? ? ?Past Surgical History:  ?Procedure Laterality Date  ? ABDOMINAL HYSTERECTOMY    ? ? ?There were no vitals filed for this visit. ? ? Subjective Assessment - 12/02/21 0800   ? ? Subjective Pt states she was sore after dry needling, soreness lasted a few days. Pt has been using her pulley at home   ? Patient Stated Goals to use Rt arm normally again   ? Currently in Pain? Yes   ? Pain Score 2    ? Pain Location Shoulder   ? Pain Orientation Right   ? Pain Descriptors / Indicators Sore   ? ?  ?  ? ?  ? ? ? ? ? [facility] PT Assessment - 12/02/21 0001   ? ?  ? Assessment  ? Medical Diagnosis Fracture/dislocation Rt shoulder   ? Referring Provider (PT) Dr Benjamin Stainhekkekandam   ? Onset Date/Surgical Date 10/27/21   ? Hand Dominance Right   ? Next MD Visit 12/08/21   ? Prior Therapy for fx hip   ?  ? AROM  ? Right Shoulder ABduction 100 Degrees   ? Right Shoulder External Rotation 18 Degrees   ? ?  ?  ? ?  ? ? ? ? ? ? ? ? ? ? ? ? ? ? ? ? [facility] Adult PT Treatment/Exercise - 12/02/21 0001   ? ?  ? Shoulder Exercises: Supine  ? Protraction Limitations attempted serratus punches, pt requires max cuing   ?  ? Shoulder Exercises: Standing  ? Other Standing  Exercises scap squeeze 10 x 5 sec hold   ?  ? Shoulder Exercises: Pulleys  ? Flexion 2 minutes   ? Scaption 2 minutes   ?  ? Shoulder Exercises: ROM/Strengthening  ? Pendulum circle CW/CCW x 20 each   ? Other ROM/Strengthening Exercises posterior shoulder rolls x 15-20   ?  ? Shoulder Exercises: Stretch  ? Table Stretch - Flexion 5 reps;10 seconds   ? Table Stretch -Flexion Limitations shoulder flexion step back hands on counter 10 sec hold x 5 reps   ? Star Gazer Stretch 60 seconds   ? Other Shoulder Stretches shoulder extension with cane 10 sec x 5 reps   ? Other Shoulder Stretches shoulder ER stretch in doorway 2 x 30 sec   ?  ? Moist Heat Therapy  ? Number Minutes Moist Heat 10 Minutes   ? Moist Heat Location Shoulder   ?  ? Manual Therapy  ? Joint Mobilization gentle circumduction Rt GH joint to improve tissue extensibility and mobility   ?  Scapular Mobilization scapular mobility lateral and inferior movement   ? Passive ROM PROM Rt shoulder in flexion; scaption; ER in scapular plane; gentle extension  - all motions within tissue limits and pt tolerance   ? ?  ?  ? ?  ? ? ? ? ? ? ? ? ? ? ? ? ? ? ? PT Long Term Goals - 11/12/21 1252   ? ?  ? PT LONG TERM GOAL #1  ? Title Improve posture and alignment with patient to demonstrate improved upright posture with posterior shoudler girdle musculature engaged and shoulders level in sitting and standing; Rt UE at side normally   ? Time 6   ? Period Weeks   ? Status New   ? Target Date 12/24/21   ?  ? PT LONG TERM GOAL #2  ? Title Increase AROM Rt shoulder to without 5 degrees of AROM Lt shoulder   ? Time 6   ? Period Weeks   ? Status New   ? Target Date 12/24/21   ?  ? PT LONG TERM GOAL #3  ? Title Increased functional strength Rt UE with patient to report return to normal functional activities   ? Time 6   ? Period Weeks   ? Status New   ? Target Date 12/24/21   ?  ? PT LONG TERM GOAL #4  ? Title Independent in HEP (including aquatic exercise program as indicated)   ?  Time 6   ? Period Weeks   ? Status New   ? Target Date 12/24/21   ?  ? PT LONG TERM GOAL #5  ? Title Improve functional limitation score to 59   ? Time 6   ? Period Weeks   ? Status New   ? Target Date 12/24/21   ? ?  ?  ? ?  ? ? ? ? ? ? ? ? Plan - 12/02/21 0840   ? ? Clinical Impression Statement Pt continues with significant Rt shoulder weakness and decreased ROM. Pt requests to hold dry needling today due to soreness after last session. Pt requires cuing for coordination with scapular and shoulder strengthening exercises.   ? PT Next Visit Plan progress HEP, Rt shoulder stretch and strengthening   ? PT Home Exercise Plan [id number]   ? Consulted and Agree with Plan of Care Patient   ? ?  ?  ? ?  ? ? ?Patient will benefit from skilled therapeutic intervention in order to improve the following deficits and impairments:    ? ?Visit Diagnosis: ?Acute pain of right shoulder ? ?Other symptoms and signs involving the musculoskeletal system ? ?Abnormal posture ? ?Muscle weakness (generalized) ? ?Generalized edema ? ? ? ? ?Problem List ?Patient Active Problem List  ? Diagnosis Date Noted  ? Closed fracture dislocation of shoulder, right, initial encounter 10/27/2021  ? Osteopenia 08/03/2018  ? Closed intertrochanteric fracture of right hip status post nailing 07/26/2018  ? Premature atrial contraction 06/29/2018  ? Type 2 diabetes mellitus without complication, without long-term current use of insulin (HCC) 06/29/2018  ? Annual physical exam 08/19/2015  ? Primary osteoarthritis of right knee 08/19/2015  ? Renal insufficiency 08/15/2014  ? Panic disorder 05/23/2014  ? Major depressive disorder, recurrent severe without psychotic features (HCC) 08/06/2013  ? Hyperlipidemia 07/07/2012  ? Hypertension 07/05/2012  ? ? ?Lupita Rosales, PT ?12/02/2021, 8:42 AM ? ?[place] ?[facility] ?[address] ?[place], [place], [address] ?Phone: [phone]   Fax:  [phone] ? ?Name: Kristy Hays ?MRN: [id number] ?Date of Birth: 1945-12-16 ? ? ? ?

## 2021-12-05 ENCOUNTER — Encounter: Payer: Self-pay | Admitting: Rehabilitative and Restorative Service Providers"

## 2021-12-05 ENCOUNTER — Other Ambulatory Visit: Payer: Self-pay

## 2021-12-05 ENCOUNTER — Ambulatory Visit: Payer: Medicare (Managed Care) | Admitting: Rehabilitative and Restorative Service Providers"

## 2021-12-05 DIAGNOSIS — M25511 Pain in right shoulder: Secondary | ICD-10-CM | POA: Diagnosis not present

## 2021-12-05 DIAGNOSIS — M6281 Muscle weakness (generalized): Secondary | ICD-10-CM

## 2021-12-05 DIAGNOSIS — R29898 Other symptoms and signs involving the musculoskeletal system: Secondary | ICD-10-CM

## 2021-12-05 DIAGNOSIS — R601 Generalized edema: Secondary | ICD-10-CM

## 2021-12-05 DIAGNOSIS — R293 Abnormal posture: Secondary | ICD-10-CM

## 2021-12-05 NOTE — Therapy (Addendum)
[facility] [place] [address] [place], [place], [address] Phone: [phone]   Fax:  [phone]  Physical Therapy Treatment and Discharge Summary PHYSICAL THERAPY DISCHARGE SUMMARY  Visits from Start of Care: 7  Current functional level related to goals / functional outcomes: See progress note for discharge status    Remaining deficits: Shoulder weakness   Education / Equipment: HEP    Patient agrees to discharge. Patient goals were not met. Patient is being discharged due to a change in medical status.  Beldon Nowling P. Leonor Liv PT, MPH 12/11/21 10:35 AM   Patient Details  Name: Kristy Hays MRN: [id number] Date of Birth: [birth year] Referring Provider (PT): Dr Benjamin Stain   Encounter Date: 12/05/2021   PT End of Session - 12/05/21 1107     Visit Number 7    Number of Visits 12    Date for PT Re-Evaluation 12/24/21    PT Start Time 1104    PT Stop Time 1153    PT Time Calculation (min) 49 min    Activity Tolerance Patient tolerated treatment well             Past Medical History:  Diagnosis Date   Anxiety    Depression    Glaucoma    Hyperlipidemia    Hypertension     Past Surgical History:  Procedure Laterality Date   ABDOMINAL HYSTERECTOMY      There were no vitals filed for this visit.   Subjective Assessment - 12/05/21 1108     Subjective Sore this week from exercises. Working on exercises at home. Sleeping well.    Currently in Pain? No/denies    Pain Score 0-No pain    Pain Orientation Right    Pain Descriptors / Indicators Sore    Pain Type Acute pain                [facility] PT Assessment - 12/05/21 0001       Assessment   Medical Diagnosis Fracture/dislocation Rt shoulder    Referring Provider (PT) Dr Benjamin Stain    Onset Date/Surgical Date 10/27/21    Hand Dominance Right    Next MD Visit 12/08/21    Prior Therapy for fx hip      AROM   Overall AROM Comments assessed in standing     Right Shoulder Flexion 48 Degrees   noted scapular dyskinesis   Right Shoulder ABduction 40 Degrees   in scapular plane - noted dyskinesis   Right Shoulder External Rotation 8 Degrees   elbow at side     PROM   Overall PROM Comments assessed in supine    Right Shoulder Extension 49 Degrees    Right Shoulder Flexion 130 Degrees    Right Shoulder ABduction 125 Degrees   in scapular plane   Right Shoulder Internal Rotation 54 Degrees   in scapular plane   Right Shoulder External Rotation 34 Degrees   in scapular plane     Strength   Overall Strength Comments weakness noted with all movements against gravity - better active movement with gravity eliminated in supine position      Palpation   Palpation comment muscular tightness Rt pecs; upper trap; leveator; teres; deltoid; biceps                           [facility] Adult PT Treatment/Exercise - 12/05/21 0001       Shoulder Exercises: Supine   Flexion Limitations small  range flexion; horizontal ab/add; circles CW/CCW x ~ 10 each    Other Supine Exercises scap squeeze 10 sec x 5 reps    Other Supine Exercises punch hand to ceiling hold 2-3 sec slowly lower bending elbow x 5 reps      Shoulder Exercises: Pulleys   Flexion Limitations 10 sec hold x 10 reps    Scaption Limitations 10 sec x 10 reps    Other Pulley Exercises holding shoulder in some flexion and abduction for opening and closing - horizontal ab/add x 10 reps    Other Pulley Exercises eccentric lowering from flexed position assisting with Lt UE as needed x 10 `      Shoulder Exercises: Therapy Ball   Flexion Both;10 reps    Flexion Limitations 10 sec hold      Shoulder Exercises: Isometric Strengthening   Flexion 5X5"    Extension 5X5"    External Rotation 5X5"    ABduction 5X5"      Shoulder Exercises: Stretch   External Rotation Stretch 5 reps;10 seconds   cane Rt elbow at side   Table Stretch - Flexion 5 reps;10 seconds    Table Stretch -Flexion  Limitations shoulder flexion step back hands on counter 10 sec hold x 5 reps    Other Shoulder Stretches shoulder extension with cane 10 sec x 5 reps      Moist Heat Therapy   Number Minutes Moist Heat 10 Minutes    Moist Heat Location Shoulder      Manual Therapy   Joint Mobilization circumduction Rt GH joint to improve tissue extensibility and mobility    Soft tissue mobilization soft tissue mobilization to Rt shoulder girdle    Scapular Mobilization scapular mobility lateral and inferior movement    Passive ROM PROM Rt shoulder in flexion; scaption; ER in scapular plane; gentle extension  - all motions within tissue limits and pt tolerance                     PT Education - 12/05/21 1129     Education Details HEP    Person(s) Educated Patient    Methods Explanation;Demonstration;Tactile cues;Verbal cues;Handout    Comprehension Verbalized understanding;Returned demonstration;Verbal cues required;Tactile cues required                 PT Long Term Goals - 11/12/21 1252       PT LONG TERM GOAL #1   Title Improve posture and alignment with patient to demonstrate improved upright posture with posterior shoudler girdle musculature engaged and shoulders level in sitting and standing; Rt UE at side normally    Time 6    Period Weeks    Status New    Target Date 12/24/21      PT LONG TERM GOAL #2   Title Increase AROM Rt shoulder to without 5 degrees of AROM Lt shoulder    Time 6    Period Weeks    Status New    Target Date 12/24/21      PT LONG TERM GOAL #3   Title Increased functional strength Rt UE with patient to report return to normal functional activities    Time 6    Period Weeks    Status New    Target Date 12/24/21      PT LONG TERM GOAL #4   Title Independent in HEP (including aquatic exercise program as indicated)    Time 6    Period Weeks  Status New    Target Date 12/24/21      PT LONG TERM GOAL #5   Title Improve functional  limitation score to 59    Time 6    Period Weeks    Status New    Target Date 12/24/21                   Plan - 12/05/21 1120     Clinical Impression Statement Persistent soreness through Rt shoulder girdle. PROM is increasing but remains limited passively at all end ranges greatest in rotation and extension.There is significant weakness in Rt shoulder and AROM against gravity is limited and patient demonstrates scapular dyskinesis. Added trial of isometric strengthening all planes. Continued with manual work and PROM. Concerned about persistent weakness with AROM against gravity.    Rehab Potential Good    PT Frequency 2x / week    PT Duration 6 weeks    PT Treatment/Interventions ADLs/Self Care Home Management;Aquatic Therapy;Cryotherapy;Electrical Stimulation;Iontophoresis 4mg /ml Dexamethasone;Moist Heat;Ultrasound;Therapeutic activities;Therapeutic exercise;Neuromuscular re-education;Patient/family education;Manual techniques;Passive range of motion;Dry needling;Taping;Vasopneumatic Device    PT Next Visit Plan progress HEP, Rt shoulder stretch and strengthening    PT Home Exercise Plan [email or web address]    Consulted and Agree with Plan of Care Patient             Patient will benefit from skilled therapeutic intervention in order to improve the following deficits and impairments:     Visit Diagnosis: Acute pain of right shoulder  Other symptoms and signs involving the musculoskeletal system  Abnormal posture  Muscle weakness (generalized)  Generalized edema     Problem List Patient Active Problem List   Diagnosis Date Noted   Closed fracture dislocation of shoulder, right, initial encounter 10/27/2021   Osteopenia 08/03/2018   Closed intertrochanteric fracture of right hip status post nailing 07/26/2018   Premature atrial contraction 06/29/2018   Type 2 diabetes mellitus without complication, without long-term current use of insulin (HCC) 06/29/2018   Annual  physical exam 08/19/2015   Primary osteoarthritis of right knee 08/19/2015   Renal insufficiency 08/15/2014   Panic disorder 05/23/2014   Major depressive disorder, recurrent severe without psychotic features (HCC) 08/06/2013   Hyperlipidemia 07/07/2012   Hypertension 07/05/2012    Vang Kraeger Rober Minion, PT, MPH  12/05/2021, 12:06 PM  [facility] [place] [address] [place] [address] [place], [place], [address] Phone: [phone]   Fax:  [phone]  Name: Desare Diers MRN: [id number] Date of Birth: March 19, 1946

## 2021-12-05 NOTE — Patient Instructions (Signed)
Access Code: [email or web address] ?URL: [email or web address] ?Date: 12/05/2021 ?Prepared by: Gillermo Murdoch ? ?Exercises ?- Seated Cervical Retraction  - 2 x daily - 7 x weekly - 1-2 sets - 5-10 reps - 10 sec  hold ?- Standing Scapular Retraction  - 3 x daily - 7 x weekly - 1 sets - 10 reps - 10 sec  hold ?- Shoulder External Rotation and Scapular Retraction  - 3 x daily - 7 x weekly - 1 sets - 10 reps - 3-5 sec   hold ?- Circular Shoulder Pendulum with Table Support  - 3-4 x daily - 7 x weekly - 1 sets - 20-30 reps ?- Seated Shoulder Flexion Towel Slide at Table Top Full Range of Motion  - 2 x daily - 7 x weekly - 1 sets - 5-10 reps - 10sec  hold ?- Seated Shoulder External Rotation AAROM with Cane and Hand in Neutral  - 2 x daily - 7 x weekly - 1 sets - 5-10 reps - 5-10 sec  hold ?- Standing Pectoral Release with Ball at [place]  - 3-4 x daily - 7 x weekly ?- Standing Shoulder Extension with Dowel  - 2 x daily - 7 x weekly - 1 sets - 5 reps - 5-10 sec  hold ?- Standing Shoulder and Trunk Flexion at Table  - 2 x daily - 7 x weekly - 1 sets - 5 reps - 5-10 sec  hold ?- Standing Backward Shoulder Rolls  - 2 x daily - 7 x weekly - 1 sets - 10 reps - 1-2 sec  hold ?- Supine Scapular Retraction  - 2 x daily - 7 x weekly - 1 sets - 10 reps - 5-10 sec  hold ?- Standing Bilateral Shoulder Internal Rotation AAROM with Dowel  - 2 x daily - 7 x weekly - 1 sets - 5-10 reps - 10 sec  hold ?- Standing Shoulder Internal Rotation Stretch with Towel  - 2 x daily - 7 x weekly - 1 sets - 3-5 reps - 10-15  hold ?- Shoulder Flexion Wall Slide with Towel  - 2 x daily - 7 x weekly - 1 sets - 5-10 reps - 10 sec  hold ?- Supine Single Arm Shoulder Protraction  - 2 x daily - 7 x weekly - 1 sets - 5 reps - 2-3 sec  hold ?- Seated Shoulder Flexion AAROM with Pulley Behind  - 2 x daily - 7 x weekly - 1 sets - 10 reps - 10 sec  hold ?- Seated Shoulder Scaption AAROM with Pulley at Side  - 2 x daily - 7 x weekly - 1 sets - 10 reps - 10sec  hold ?-  Supine Shoulder Flexion AAROM  - 2 x daily - 7 x weekly - 1 sets - 5 reps - 5-10 sec  hold ?- Supine Shoulder Circles  - 1 x daily - 7 x weekly - 1 sets - 1-25 reps ?- Isometric Shoulder Abduction at Wall  - 2 x daily - 7 x weekly - 1 sets - 5-10 reps - 5 sec  hold ?- Isometric Shoulder External Rotation at Wall  - 2 x daily - 7 x weekly - 1 sets - 5 reps - 5 sec  hold ?- Isometric Shoulder Extension at Wall  - 2 x daily - 7 x weekly - 1 sets - 5-10 reps - 5 sec  hold ?- Standing Isometric Shoulder Flexion with Doorway - Arm Bent  - 2 x daily -  7 x weekly - 1 sets - 5 reps - 5 sec  hold ?

## 2021-12-08 ENCOUNTER — Other Ambulatory Visit: Payer: Self-pay

## 2021-12-08 ENCOUNTER — Ambulatory Visit (INDEPENDENT_AMBULATORY_CARE_PROVIDER_SITE_OTHER): Payer: Medicare (Managed Care) | Admitting: Sports Medicine

## 2021-12-08 DIAGNOSIS — M1711 Unilateral primary osteoarthritis, right knee: Secondary | ICD-10-CM | POA: Diagnosis not present

## 2021-12-08 DIAGNOSIS — S4291XA Fracture of right shoulder girdle, part unspecified, initial encounter for closed fracture: Secondary | ICD-10-CM

## 2021-12-08 MED ORDER — MELOXICAM 15 MG PO TABS: ORAL_TABLET | ORAL | 3 refills | Status: DC

## 2021-12-08 NOTE — Progress Notes (Signed)
? ? ?  Procedures performed today:   ? ?None. ? ?Independent interpretation of notes and tests performed by another provider:  ? ?None. ? ?Brief History, Exam, Impression, and Recommendations:   ? ?Closed fracture dislocation of shoulder, right, initial encounter ?This is a very pleasant 76 year old female, she has baseline end-stage shoulder osteoarthritis, approximately 6 weeks ago she was walking her dog, and dislocated her right shoulder, we reduced the dislocation in office, she had several bony fragments, she was then immobilized, and more recently was placed into physical therapy, unfortunately she is not making any progress, no improvement in motion, albeit not all that painful. 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 ?I did explain to her that at this point with her lack of progression and lack of motion a reverse shoulder arthroplasty was likely the only option. ?There is a CT of her shoulder in the chart, I would like a surgical opinion from Dr. Everardo Pacific for reverse shoulder arthroplasty. ? ?Primary osteoarthritis of right knee ?Refilling meloxicam per her request. ? ?Chronic process with exacerbation pharmacologic intervention ? ? ?___________________________________________ ?[name]. Benjamin Stain, M.D., ABFM., CAQSM. ?Primary Care and Sports Medicine ?[facility] ? ?Adjunct Instructor of Family Medicine  ?[employer] ?

## 2021-12-08 NOTE — Assessment & Plan Note (Signed)
This is a very pleasant 76 year old female, she has baseline end-stage shoulder osteoarthritis, approximately 6 weeks ago she was walking her dog, and dislocated her right shoulder, we reduced the dislocation in office, she had several bony fragments, she was then immobilized, and more recently was placed into physical therapy, unfortunately she is not making any progress, no improvement in motion, albeit not all that painful. 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 ?I did explain to her that at this point with her lack of progression and lack of motion a reverse shoulder arthroplasty was likely the only option. ?There is a CT of her shoulder in the chart, I would like a surgical opinion from Dr. Griffin Basil for reverse shoulder arthroplasty. ?

## 2021-12-08 NOTE — Assessment & Plan Note (Signed)
Refilling meloxicam per her request. ?

## 2021-12-09 ENCOUNTER — Other Ambulatory Visit: Payer: Self-pay

## 2021-12-09 ENCOUNTER — Encounter (HOSPITAL_BASED_OUTPATIENT_CLINIC_OR_DEPARTMENT_OTHER)
Admission: RE | Admit: 2021-12-09 | Discharge: 2021-12-09 | Disposition: A | Discharge status: Home / Self Care | Payer: Medicare (Managed Care) | Source: Ambulatory Visit | Attending: Orthopaedic Surgery | Admitting: Orthopaedic Surgery

## 2021-12-09 ENCOUNTER — Encounter (HOSPITAL_BASED_OUTPATIENT_CLINIC_OR_DEPARTMENT_OTHER): Payer: Self-pay | Admitting: Orthopaedic Surgery

## 2021-12-09 ENCOUNTER — Encounter: Payer: Self-pay | Admitting: Rehabilitative and Restorative Service Providers"

## 2021-12-09 DIAGNOSIS — Z01818 Encounter for other preprocedural examination: Secondary | ICD-10-CM | POA: Insufficient documentation

## 2021-12-09 DIAGNOSIS — I10 Essential (primary) hypertension: Secondary | ICD-10-CM | POA: Diagnosis not present

## 2021-12-09 DIAGNOSIS — M25511 Pain in right shoulder: Secondary | ICD-10-CM | POA: Diagnosis not present

## 2021-12-09 LAB — BASIC METABOLIC PANEL
Anion gap: 9 (ref 5–15)
BUN: 24 mg/dL — ABNORMAL HIGH (ref 8–23)
CO2: 25 mmol/L (ref 22–32)
Calcium: 9.8 mg/dL (ref 8.9–10.3)
Chloride: 105 mmol/L (ref 98–111)
Creatinine, Ser: 1.12 mg/dL — ABNORMAL HIGH (ref 0.44–1.00)
GFR, Estimated: 51 mL/min — ABNORMAL LOW (ref >60)
Glucose, Bld: 161 mg/dL — ABNORMAL HIGH (ref 70–99)
Potassium: 3.9 mmol/L (ref 3.5–5.1)
Sodium: 139 mmol/L (ref 135–145)

## 2021-12-09 LAB — SURGICAL PCR SCREEN
MRSA, PCR: NEGATIVE
Staphylococcus aureus: NEGATIVE

## 2021-12-10 NOTE — Discharge Instructions (Addendum)
Ramond Marrow MD, MPH ?Alfonse Alpers, PA-C ?[facility] ?[address] ?[phone] (tel)   ?[phone] (fax) ? ? ?POST-OPERATIVE INSTRUCTIONS - TOTAL SHOULDER REPLACEMENT  ? ? ?WOUND CARE ?You may leave the operative dressing in place until your follow-up appointment. ?KEEP THE INCISIONS CLEAN AND DRY. ?There may be a small amount of fluid/bleeding leaking at the surgical site. This is normal after surgery.  ?If it fills with liquid or blood please call us immediately to change it for you. ?Use the provided ice machine or Ice packs as often as possible for the first 3-4 days, then as needed for pain relief.   ?Keep a layer of cloth or a shirt between your skin and the cooling unit to prevent frost bite as it can get very cold. ? ?SHOWERING: ?- You may shower on Post-Op Day #2.  ?- The dressing is water resistant but do not scrub it as it may start to peel up.   ?- You may remove the sling for showering ?- Gently pat the area dry.  ?- Do not soak the shoulder in water. Do not go swimming in the pool or ocean until your incision has completely healed (about 4-6 weeks after surgery.) ?- KEEP THE INCISIONS CLEAN AND DRY. ? ?EXERCISES ?Wear the sling at all times ?You may remove the sling for showering, but keep the arm across the chest or in a secondary sling.    ?Accidental/Purposeful External Rotation and shoulder flexion (reaching behind you) is to be avoided at all costs for the first month. ?It is ok to come out of your sling if your are sitting and have assistance for eating.   ?Do not lift anything heavier than 1 pound until we discuss it further in clinic. ? ?REGIONAL ANESTHESIA (NERVE BLOCKS) ?The anesthesia team may have performed a nerve block for you if safe in the setting of your care.  This is a great tool used to minimize pain.  Typically the block may start wearing off overnight but the long acting medicine may last for 3-4 days.  The nerve block wearing off can be a  challenging period but please utilize your as needed pain medications to try and manage this period.   ? ?POST-OP MEDICATIONS- Multimodal approach to pain control ?In general your pain will be controlled with a combination of substances.  Prescriptions unless otherwise discussed are electronically sent to your pharmacy.  This is a carefully made plan we use to minimize narcotic use.    ? ?Meloxicam - Anti-inflammatory medication taken on a scheduled basis ?Acetaminophen - Non-narcotic pain medicine taken on a scheduled basis  *Next dose at 1:15pm ?Oxycodone - This is a strong narcotic, to be used only on an ?as needed? basis for SEVERE pain. ?Aspirin 81mg  - This medicine is used to minimize the risk of blood clots after surgery. ?Omeprazole - daily medicine to protect your stomach while taking anti-inflammatories.  ?Zofran -  take as needed for nausea ? ?FOLLOW-UP ?If you develop a Fever (>101.5), Redness or Drainage from the surgical incision site, please call our office to arrange for an evaluation. ?Please call the office to schedule a follow-up appointment for a wound check, 7-10 days post-operatively. ? ?IF YOU HAVE ANY QUESTIONS, PLEASE FEEL FREE TO CALL OUR OFFICE. ? ?HELPFUL INFORMATION ? ?If you had a block, it will wear off between 8-24 hrs postop typically.  This is period when your pain may go from nearly zero to the pain you  would have had post-op without the block.  This is an abrupt transition but nothing dangerous is happening.  You may take an extra dose of narcotic when this happens. ? ?Your arm will be in a sling following surgery. You will be in this sling for the next 4 weeks.   ? ?You may be more comfortable sleeping in a semi-seated position the first few nights following surgery.  Keep a pillow propped under the elbow and forearm for comfort.  If you have a recliner type of chair it might be beneficial.  If not that is fine too, but it would be helpful to sleep propped up with pillows behind  your operated shoulder as well under your elbow and forearm.  This will reduce pulling on the suture lines. ? ?When dressing, put your operative arm in the sleeve first.  When getting undressed, take your operative arm out last.  Loose fitting, button-down shirts are recommended. ? ?In most states it is against the law to drive while your arm is in a sling. And certainly against the law to drive while taking narcotics. ? ?You may return to work/school in the next couple of days when you feel up to it. Desk work and typing in the sling is fine. ? ?We suggest you use the pain medication the first night prior to going to bed, in order to ease any pain when the anesthesia wears off. You should avoid taking pain medications on an empty stomach as it will make you nauseous. ? ?Do not drink alcoholic beverages or take illicit drugs when taking pain medications. ? ?Pain medication may make you constipated.  Below are a few solutions to try in this order: ?Decrease the amount of pain medication if you aren?t having pain. ?Drink lots of decaffeinated fluids. ?Drink prune juice and/or each dried prunes ? ?If the first 3 don?t work start with additional solutions ?Take Colace - an over-the-counter stool softener ?Take Senokot - an over-the-counter laxative ?Take Miralax - a stronger over-the-counter laxative ? ? ?Dental Antibiotics: ? ?In most cases prophylactic antibiotics for Dental procdeures after total joint surgery are not necessary. ? ?Exceptions are as follows: ? ?1. History of prior total joint infection ? ?2. Severely immunocompromised (Organ Transplant, cancer chemotherapy, Rheumatoid biologic ?meds such as Humera) ? ?3. Poorly controlled diabetes (A1C &gt; 8.0, blood glucose over 200) ? ?If you have one of these conditions, contact your surgeon for an antibiotic prescription, prior to your ?dental procedure. ? ? ?For more information including helpful videos and documents visit our website:   ? ?[email or web address] ? ?  ? ? ?Regional Anesthesia Blocks ? ?1. Numbness or the inability to move the "blocked" extremity may last from 3-48 hours after placement. The length of time depends on the medication injected and your individual response to the medication. If the numbness is not going away after 48 hours, call your surgeon. ? ?2. The extremity that is blocked will need to be protected until the numbness is gone and the  Strength has returned. Because you cannot feel it, you will need to take extra care to avoid injury. Because it may be weak, you may have difficulty moving it or using it. You may not know what position it is in without looking at it while the block is in effect. ? ?3. For blocks in the legs and feet, returning to weight bearing and walking needs to be done carefully. You will need to wait until the numbness is entirely gone and  the strength has returned. You should be able to move your leg and foot normally before you try and bear weight or walk. You will need someone to be with you when you first try to ensure you do not fall and possibly risk injury. ? ?4. Bruising and tenderness at the needle site are common side effects and will resolve in a few days. ? ?5. Persistent numbness or new problems with movement should be communicated to the surgeon or the [facility] [phone])/ [facility] [phone]).  ? ? ?Information for Discharge Teaching: ?EXPAREL (bupivacaine liposome injectable suspension)  ? ?Your surgeon or anesthesiologist gave you EXPAREL(bupivacaine) to help control your pain after surgery.  ?EXPAREL is a local anesthetic that provides pain relief by numbing the tissue around the surgical site. ?EXPAREL is designed to release pain medication over time and can control pain for up to 72 hours. ?Depending on how you respond to EXPAREL, you may require less pain medication during your recovery. ? ?Possible side  effects: ?Temporary loss of sensation or ability to move in the area where bupivacaine was injected. ?Nausea, vomiting, constipation ?Rarely, numbness and tingling in your mouth or lips, lightheadedness, or anxiety may occur. ?Cal

## 2021-12-10 NOTE — Progress Notes (Signed)
Contacted surgeon's office to request orders be placed for tomorrow's procedure ? ?

## 2021-12-10 NOTE — H&P (Signed)
? ? ?PREOPERATIVE H&P ? ?Chief Complaint: RIGHT PROXIMAL HUMERUS FRACTURE ? ?HPI: ?Kristy Hays is a 76 y.o. female who is scheduled for Procedure(s): ?REVERSE SHOULDER ARTHROPLASTY.  ? ?Patient has a past medical history significant for HTN and HLD.  ? ?She has had no previous history of reported pain in the shoulder.  She fell in February and had a fracture and dislocation of her shoulder. She had a reduction which went on about her time. She tried therapy and non-operative measures but has not been able to raise her arm afterwards. She was told by her primary care physician she may need to consider an arthroplasty. She is very limited in function currently .  ? ? ?Symptoms are rated as moderate to severe, and have been worsening.  This is significantly impairing activities of daily living.   ? ?Please see clinic note for further details on this patient's care.   ? ?She has elected for surgical management.  ? ?Past Medical History:  ?Diagnosis Date  ? Anxiety   ? Arthritis   ? Depression   ? Glaucoma   ? Hyperlipidemia   ? Hypertension   ? Proximal humerus fracture   ? right  ? ?Past Surgical History:  ?Procedure Laterality Date  ? ABDOMINAL HYSTERECTOMY    ? HIP FRACTURE SURGERY Right   ? ?Social History  ? ?Socioeconomic History  ? Marital status: Divorced  ?  Spouse name: Not on file  ? Number of children: Not on file  ? Years of education: Not on file  ? Highest education level: Not on file  ?Occupational History  ? Not on file  ?Tobacco Use  ? Smoking status: Former  ? Smokeless tobacco: Never  ? Tobacco comments:  ?  Quit 24 years ago,  ?Substance and Sexual Activity  ? Alcohol use: Not Currently  ?  Comment: Very seldom-5 times a year  ? Drug use: No  ? Sexual activity: Not Currently  ?  Partners: Male  ?Other Topics Concern  ? Not on file  ?Social History Narrative  ? Not on file  ? ?Social Determinants of Health  ? ?Financial Resource Strain: Not on file  ?Food Insecurity: Not on file  ?Transportation  Needs: Not on file  ?Physical Activity: Not on file  ?Stress: Not on file  ?Social Connections: Not on file  ? ?Family History  ?Problem Relation Age of Onset  ? Cancer Neg Hx   ? Heart attack Mother   ? Hypertension Mother   ? Hypertension Sister   ? ?Allergies  ?Allergen Reactions  ? Antihistamines, Chlorpheniramine-Type Other (See Comments)  ?  Makes pt Hyper  ? Benadryl [Diphenhydramine Hcl (Sleep)]   ?  Makes her hyper  ? Penicillins   ? ?Prior to Admission medications   ?Medication Sig Start Date End Date Taking? Authorizing Provider  ?amLODipine (NORVASC) 10 MG tablet Take 10 mg by mouth daily.   Yes [provider]  ?Biotin 54008 MCG TABS Take by mouth.   Yes [provider]  ?Calcium Carbonate-Vitamin D 600-400 MG-UNIT tablet Take 1 tablet by mouth 2 (two) times daily. 09/11/20  Yes Monica Becton, MD  ?carvedilol (COREG) 25 MG tablet TAKE 1 TABLET BY MOUTH TWICE DAILY WITH A MEAL 10/29/21  Yes Monica Becton, MD  ?Coenzyme Q10 (COQ-10) 400 MG CAPS Take by mouth.   Yes [provider]  ?ezetimibe (ZETIA) 10 MG tablet Take 1 tablet (10 mg total) by mouth daily. 08/18/21  Yes Thekkekandam,  [name], MD  ?fenofibrate 160 MG tablet Take 1 tablet (160 mg total) by mouth daily. 03/28/21  Yes Monica Becton, MD  ?lisinopril-hydrochlorothiazide (ZESTORETIC) 10-12.5 MG tablet Take 1 tablet by mouth daily. 11/10/21  Yes Monica Becton, MD  ?loratadine (CLARITIN) 10 MG tablet Take 1 tablet (10 mg total) by mouth daily. 08/14/13  Yes Charm Rings, NP  ?meloxicam (MOBIC) 15 MG tablet One tab PO every 24 hours with a meal for 2 weeks, then once every 24 hours prn pain. 12/08/21  Yes Monica Becton, MD  ?Omega-3 Fatty Acids (FISH OIL) 1200 MG CAPS Take by mouth.   Yes [provider]  ?PARoxetine (PAXIL) 20 MG tablet Take 1 tablet (20 mg total) by mouth every morning. 01/17/21  Yes Monica Becton, MD  ? ? ?ROS: All other systems have been  reviewed and were otherwise negative with the exception of those mentioned in the HPI and as above. ? ?Physical Exam: ?General: Alert, no acute distress ?Cardiovascular: No pedal edema ?Respiratory: No cyanosis, no use of accessory musculature ?GI: No organomegaly, abdomen is soft and non-tender ?Skin: No lesions in the area of chief complaint ?Neurologic: Sensation intact distally ?Psychiatric: Patient is competent for consent with normal mood and affect ?Lymphatic: No axillary or cervical lymphadenopathy ? ?MUSCULOSKELETAL:  ?Active forward elevation to about 30, passive to about 170. Axillary nerve sensation is intact, the deltoid appears to be firing although minimal as compared to the contralateral side. Internal rotation is to the back pocket. External rotation to 30 degrees. Cuff strength is clearly weak throughout. ? ?Imaging: ?I reviewed the X-rays and CT scan which demonstrate a fracture dislocation of a shoulder with primary glenohumeral arthritis. There is avulsion of the cuff with tuberosity fragments that are displaced. ? ?BMI: ?Body mass index is 31.76 kg/m?. ? ?Lab Results  ?Component Value Date  ? ALBUMIN 4.3 11/24/2016  ? ? ? ?Diabetes: ?  ?Patient has a diagnosis of diabetes,  ?Lab Results  ?Component Value Date  ? HGBA1C 6.0 (H) 06/30/2021  ? ? ?Smoking Status: ?Social History  ? ?Tobacco Use  ?Smoking Status Former  ?Smokeless Tobacco Never  ?Tobacco Comments  ? Quit 24 years ago,  ? ?Counseling given: Not Answered ?Tobacco comments: Quit 24 years ago, ? ? ? ? ? ?Assessment: ?RIGHT PROXIMAL HUMERUS FRACTURE ? ?Plan: ?Plan for Procedure(s): ?REVERSE SHOULDER ARTHROPLASTY ? ?The risks benefits and alternatives were discussed with the patient including but not limited to the risks of nonoperative treatment, versus surgical intervention including infection, bleeding, nerve injury,  blood clots, cardiopulmonary complications, morbidity, mortality, among others, and they were willing to proceed.   ? ?We additionally specifically discussed risks of axillary nerve injury, infection, periprosthetic fracture, continued pain and longevity of implants prior to beginning procedure.  ?  ?Patient will be closely monitored in PACU for medical stabilization and pain control. If found stable in PACU, patient may be discharged home with outpatient follow-up. If any concerns regarding patient's stabilization patient will be admitted for observation after surgery. The patient is planning to be discharged home with outpatient PT.  ? ?The patient acknowledged the explanation, agreed to proceed with the plan and consent was signed.  ? ?Operative Plan: Right reverse total shoulder arthroplasty  ?Discharge Medications: Standard ?DVT Prophylaxis: aspirin ?Physical Therapy: outpatient PT ?Special Discharge needs: sling. IceMan ? ? ?Vernetta Honey, PA-C ? ?12/10/2021 ?3:14 PM ? ?

## 2021-12-11 ENCOUNTER — Ambulatory Visit (HOSPITAL_COMMUNITY): Payer: Medicare (Managed Care)

## 2021-12-11 ENCOUNTER — Ambulatory Visit (HOSPITAL_BASED_OUTPATIENT_CLINIC_OR_DEPARTMENT_OTHER): Payer: Medicare (Managed Care) | Admitting: Certified Registered"

## 2021-12-11 ENCOUNTER — Other Ambulatory Visit: Payer: Self-pay

## 2021-12-11 ENCOUNTER — Encounter (HOSPITAL_BASED_OUTPATIENT_CLINIC_OR_DEPARTMENT_OTHER): Payer: Self-pay | Admitting: Orthopaedic Surgery

## 2021-12-11 ENCOUNTER — Ambulatory Visit (HOSPITAL_BASED_OUTPATIENT_CLINIC_OR_DEPARTMENT_OTHER)
Admission: RE | Admit: 2021-12-11 | Discharge: 2021-12-12 | Disposition: A | Discharge status: Home / Self Care | Payer: Medicare (Managed Care) | Attending: Orthopaedic Surgery | Admitting: Orthopaedic Surgery

## 2021-12-11 ENCOUNTER — Encounter (HOSPITAL_BASED_OUTPATIENT_CLINIC_OR_DEPARTMENT_OTHER)
Admission: RE | Disposition: A | Discharge status: Home / Self Care | Payer: Self-pay | Source: Home / Self Care | Attending: Orthopaedic Surgery

## 2021-12-11 DIAGNOSIS — S42201A Unspecified fracture of upper end of right humerus, initial encounter for closed fracture: Secondary | ICD-10-CM | POA: Insufficient documentation

## 2021-12-11 DIAGNOSIS — W19XXXA Unspecified fall, initial encounter: Secondary | ICD-10-CM | POA: Diagnosis not present

## 2021-12-11 DIAGNOSIS — I10 Essential (primary) hypertension: Secondary | ICD-10-CM

## 2021-12-11 DIAGNOSIS — Z96611 Presence of right artificial shoulder joint: Secondary | ICD-10-CM

## 2021-12-11 DIAGNOSIS — F418 Other specified anxiety disorders: Secondary | ICD-10-CM | POA: Diagnosis not present

## 2021-12-11 DIAGNOSIS — M19011 Primary osteoarthritis, right shoulder: Secondary | ICD-10-CM

## 2021-12-11 DIAGNOSIS — E119 Type 2 diabetes mellitus without complications: Secondary | ICD-10-CM

## 2021-12-11 DIAGNOSIS — E785 Hyperlipidemia, unspecified: Secondary | ICD-10-CM | POA: Diagnosis not present

## 2021-12-11 DIAGNOSIS — E669 Obesity, unspecified: Secondary | ICD-10-CM | POA: Insufficient documentation

## 2021-12-11 DIAGNOSIS — Z6831 Body mass index (BMI) 31.0-31.9, adult: Secondary | ICD-10-CM | POA: Insufficient documentation

## 2021-12-11 DIAGNOSIS — Z87891 Personal history of nicotine dependence: Secondary | ICD-10-CM | POA: Insufficient documentation

## 2021-12-11 DIAGNOSIS — G8918 Other acute postprocedural pain: Secondary | ICD-10-CM | POA: Diagnosis not present

## 2021-12-11 HISTORY — DX: Unspecified osteoarthritis, unspecified site: M19.90

## 2021-12-11 HISTORY — PX: REVERSE SHOULDER ARTHROPLASTY: SHX5054

## 2021-12-11 HISTORY — DX: Unspecified fracture of upper end of unspecified humerus, initial encounter for closed fracture: S42.209A

## 2021-12-11 SURGERY — ARTHROPLASTY, SHOULDER, TOTAL, REVERSE
Anesthesia: General | Site: Shoulder | Laterality: Right

## 2021-12-11 MED ORDER — ACETAMINOPHEN 500 MG PO TABS
1000.0000 mg | ORAL_TABLET | Freq: Four times a day (QID) | ORAL | Status: DC
  Administered 2021-12-12 (×2): 1000 mg via ORAL
  Filled 2021-12-11 (×2): qty 2

## 2021-12-11 MED ORDER — PHENYLEPHRINE HCL-NACL 20-0.9 MG/250ML-% IV SOLN
INTRAVENOUS | Status: DC | PRN
  Administered 2021-12-11: 25 ug/min via INTRAVENOUS

## 2021-12-11 MED ORDER — FENTANYL CITRATE (PF) 100 MCG/2ML IJ SOLN
100.0000 ug | Freq: Once | INTRAMUSCULAR | Status: AC
  Administered 2021-12-11: 50 ug via INTRAVENOUS

## 2021-12-11 MED ORDER — BUPIVACAINE LIPOSOME 1.3 % IJ SUSP
INTRAMUSCULAR | Status: DC | PRN
  Administered 2021-12-11: 10 mL via PERINEURAL

## 2021-12-11 MED ORDER — HYDROMORPHONE HCL 1 MG/ML IJ SOLN: 0.5000 mg | INTRAMUSCULAR | Status: DC | PRN

## 2021-12-11 MED ORDER — LISINOPRIL-HYDROCHLOROTHIAZIDE 10-12.5 MG PO TABS: 1.0000 | ORAL_TABLET | Freq: Every day | ORAL | Status: DC

## 2021-12-11 MED ORDER — OXYCODONE HCL 5 MG PO TABS: 10.0000 mg | ORAL_TABLET | ORAL | Status: DC | PRN

## 2021-12-11 MED ORDER — OXYCODONE HCL 5 MG PO TABS: 5.0000 mg | ORAL_TABLET | ORAL | Status: DC | PRN

## 2021-12-11 MED ORDER — ONDANSETRON HCL 4 MG/2ML IJ SOLN: 4.0000 mg | Freq: Four times a day (QID) | INTRAMUSCULAR | Status: DC | PRN

## 2021-12-11 MED ORDER — ACETAMINOPHEN 500 MG PO TABS: 1000.0000 mg | ORAL_TABLET | Freq: Three times a day (TID) | ORAL | 0 refills | Status: AC

## 2021-12-11 MED ORDER — GABAPENTIN 300 MG PO CAPS
300.0000 mg | ORAL_CAPSULE | Freq: Once | ORAL | Status: AC
  Administered 2021-12-11: 300 mg via ORAL

## 2021-12-11 MED ORDER — BISACODYL 5 MG PO TBEC: 5.0000 mg | DELAYED_RELEASE_TABLET | Freq: Every day | ORAL | Status: DC | PRN

## 2021-12-11 MED ORDER — VANCOMYCIN HCL 1000 MG IV SOLR
INTRAVENOUS | Status: DC | PRN
  Administered 2021-12-11: 1000 mg via TOPICAL

## 2021-12-11 MED ORDER — METHOCARBAMOL 1000 MG/10ML IJ SOLN
500.0000 mg | Freq: Four times a day (QID) | INTRAVENOUS | Status: DC | PRN
  Filled 2021-12-11: qty 5

## 2021-12-11 MED ORDER — DOCUSATE SODIUM 100 MG PO CAPS
100.0000 mg | ORAL_CAPSULE | Freq: Two times a day (BID) | ORAL | Status: DC
  Filled 2021-12-11: qty 1

## 2021-12-11 MED ORDER — CARVEDILOL 25 MG PO TABS: 25.0000 mg | ORAL_TABLET | Freq: Two times a day (BID) | ORAL | Status: DC

## 2021-12-11 MED ORDER — EPHEDRINE 5 MG/ML INJ
INTRAVENOUS | Status: AC
  Filled 2021-12-11: qty 15

## 2021-12-11 MED ORDER — ONDANSETRON HCL 4 MG PO TABS: 4.0000 mg | ORAL_TABLET | Freq: Four times a day (QID) | ORAL | Status: DC | PRN

## 2021-12-11 MED ORDER — ACETAMINOPHEN 500 MG PO TABS
ORAL_TABLET | ORAL | Status: AC
  Filled 2021-12-11: qty 2

## 2021-12-11 MED ORDER — METOCLOPRAMIDE HCL 5 MG/ML IJ SOLN: 5.0000 mg | Freq: Three times a day (TID) | INTRAMUSCULAR | Status: DC | PRN

## 2021-12-11 MED ORDER — 0.9 % SODIUM CHLORIDE (POUR BTL) OPTIME
TOPICAL | Status: DC | PRN
Start: 2021-12-11 — End: 2021-12-11
  Administered 2021-12-11: 1000 mL

## 2021-12-11 MED ORDER — VANCOMYCIN HCL IN DEXTROSE 1-5 GM/200ML-% IV SOLN
1000.0000 mg | INTRAVENOUS | Status: AC
  Administered 2021-12-11 (×2): 1000 mg via INTRAVENOUS

## 2021-12-11 MED ORDER — TRANEXAMIC ACID-NACL 1000-0.7 MG/100ML-% IV SOLN
1000.0000 mg | INTRAVENOUS | Status: AC
  Administered 2021-12-11: 1000 mg via INTRAVENOUS

## 2021-12-11 MED ORDER — CELECOXIB 200 MG PO CAPS
200.0000 mg | ORAL_CAPSULE | Freq: Two times a day (BID) | ORAL | Status: DC
  Administered 2021-12-11: 200 mg via ORAL
  Filled 2021-12-11: qty 1

## 2021-12-11 MED ORDER — MIDAZOLAM HCL 2 MG/2ML IJ SOLN
INTRAMUSCULAR | Status: AC
  Filled 2021-12-11: qty 2

## 2021-12-11 MED ORDER — OXYCODONE HCL 5 MG/5ML PO SOLN: 5.0000 mg | Freq: Once | ORAL | Status: DC | PRN

## 2021-12-11 MED ORDER — FENTANYL CITRATE (PF) 100 MCG/2ML IJ SOLN
INTRAMUSCULAR | Status: AC
Start: 2021-12-11 — End: ?
  Filled 2021-12-11: qty 2

## 2021-12-11 MED ORDER — ZOLPIDEM TARTRATE 5 MG PO TABS: 5.0000 mg | ORAL_TABLET | Freq: Every evening | ORAL | Status: DC | PRN

## 2021-12-11 MED ORDER — LACTATED RINGERS IV SOLN: INTRAVENOUS | Status: DC

## 2021-12-11 MED ORDER — PHENYLEPHRINE HCL (PRESSORS) 10 MG/ML IV SOLN
INTRAVENOUS | Status: DC | PRN
  Administered 2021-12-11 (×2): 120 ug via INTRAVENOUS

## 2021-12-11 MED ORDER — DEXAMETHASONE SODIUM PHOSPHATE 10 MG/ML IJ SOLN
INTRAMUSCULAR | Status: DC | PRN
  Administered 2021-12-11: 10 mg via INTRAVENOUS

## 2021-12-11 MED ORDER — VANCOMYCIN HCL IN DEXTROSE 1-5 GM/200ML-% IV SOLN
INTRAVENOUS | Status: AC
  Filled 2021-12-11: qty 200

## 2021-12-11 MED ORDER — OXYCODONE HCL 5 MG PO TABS: ORAL_TABLET | ORAL | 0 refills | Status: AC

## 2021-12-11 MED ORDER — PHENYLEPHRINE 40 MCG/ML (10ML) SYRINGE FOR IV PUSH (FOR BLOOD PRESSURE SUPPORT)
PREFILLED_SYRINGE | INTRAVENOUS | Status: AC
  Filled 2021-12-11: qty 10

## 2021-12-11 MED ORDER — MELOXICAM 15 MG PO TABS: 15.0000 mg | ORAL_TABLET | Freq: Every day | ORAL | 0 refills | Status: DC

## 2021-12-11 MED ORDER — TRANEXAMIC ACID-NACL 1000-0.7 MG/100ML-% IV SOLN
INTRAVENOUS | Status: AC
  Filled 2021-12-11: qty 100

## 2021-12-11 MED ORDER — ALBUTEROL SULFATE HFA 108 (90 BASE) MCG/ACT IN AERS
INHALATION_SPRAY | RESPIRATORY_TRACT | Status: DC | PRN
  Administered 2021-12-11: 4 via RESPIRATORY_TRACT

## 2021-12-11 MED ORDER — BUPIVACAINE HCL (PF) 0.5 % IJ SOLN
INTRAMUSCULAR | Status: DC | PRN
  Administered 2021-12-11: 15 mL via PERINEURAL

## 2021-12-11 MED ORDER — OMEPRAZOLE 20 MG PO CPDR: 20.0000 mg | DELAYED_RELEASE_CAPSULE | Freq: Every day | ORAL | 0 refills | Status: AC

## 2021-12-11 MED ORDER — LIDOCAINE 2% (20 MG/ML) 5 ML SYRINGE
INTRAMUSCULAR | Status: AC
  Filled 2021-12-11: qty 5

## 2021-12-11 MED ORDER — POLYETHYLENE GLYCOL 3350 17 G PO PACK: 17.0000 g | PACK | Freq: Every day | ORAL | Status: DC | PRN

## 2021-12-11 MED ORDER — SUGAMMADEX SODIUM 200 MG/2ML IV SOLN
INTRAVENOUS | Status: DC | PRN
Start: 2021-12-11 — End: 2021-12-11
  Administered 2021-12-11: 350 mg via INTRAVENOUS

## 2021-12-11 MED ORDER — EPHEDRINE SULFATE (PRESSORS) 50 MG/ML IJ SOLN
INTRAMUSCULAR | Status: DC | PRN
Start: 2021-12-11 — End: 2021-12-11
  Administered 2021-12-11 (×5): 10 mg via INTRAVENOUS

## 2021-12-11 MED ORDER — ACETAMINOPHEN 500 MG PO TABS: 1000.0000 mg | ORAL_TABLET | Freq: Once | ORAL | Status: DC

## 2021-12-11 MED ORDER — ONDANSETRON HCL 4 MG/2ML IJ SOLN
INTRAMUSCULAR | Status: AC
  Filled 2021-12-11: qty 2

## 2021-12-11 MED ORDER — PROPOFOL 10 MG/ML IV BOLUS
INTRAVENOUS | Status: DC | PRN
  Administered 2021-12-11: 150 mg via INTRAVENOUS

## 2021-12-11 MED ORDER — METHOCARBAMOL 500 MG PO TABS
500.0000 mg | ORAL_TABLET | Freq: Four times a day (QID) | ORAL | Status: DC | PRN
  Administered 2021-12-12: 500 mg via ORAL
  Filled 2021-12-11: qty 1

## 2021-12-11 MED ORDER — FENTANYL CITRATE (PF) 100 MCG/2ML IJ SOLN: 25.0000 ug | INTRAMUSCULAR | Status: DC | PRN

## 2021-12-11 MED ORDER — ACETAMINOPHEN 500 MG PO TABS
1000.0000 mg | ORAL_TABLET | Freq: Once | ORAL | Status: AC
  Administered 2021-12-11: 1000 mg via ORAL

## 2021-12-11 MED ORDER — DEXAMETHASONE SODIUM PHOSPHATE 10 MG/ML IJ SOLN
INTRAMUSCULAR | Status: AC
  Filled 2021-12-11: qty 1

## 2021-12-11 MED ORDER — FENOFIBRATE 160 MG PO TABS: 160.0000 mg | ORAL_TABLET | Freq: Every day | ORAL | Status: DC

## 2021-12-11 MED ORDER — EZETIMIBE 10 MG PO TABS: 10.0000 mg | ORAL_TABLET | Freq: Every day | ORAL | Status: DC

## 2021-12-11 MED ORDER — LIDOCAINE 2% (20 MG/ML) 5 ML SYRINGE
INTRAMUSCULAR | Status: DC | PRN
  Administered 2021-12-11: 60 mg via INTRAVENOUS

## 2021-12-11 MED ORDER — ROCURONIUM BROMIDE 100 MG/10ML IV SOLN
INTRAVENOUS | Status: DC | PRN
  Administered 2021-12-11: 50 mg via INTRAVENOUS

## 2021-12-11 MED ORDER — ALBUTEROL SULFATE (2.5 MG/3ML) 0.083% IN NEBU
INHALATION_SOLUTION | RESPIRATORY_TRACT | Status: AC
  Filled 2021-12-11: qty 3

## 2021-12-11 MED ORDER — OXYCODONE HCL 5 MG PO TABS: 5.0000 mg | ORAL_TABLET | Freq: Once | ORAL | Status: DC | PRN

## 2021-12-11 MED ORDER — GABAPENTIN 300 MG PO CAPS
ORAL_CAPSULE | ORAL | Status: AC
  Filled 2021-12-11: qty 1

## 2021-12-11 MED ORDER — ONDANSETRON HCL 4 MG PO TABS: 4.0000 mg | ORAL_TABLET | Freq: Three times a day (TID) | ORAL | 0 refills | Status: AC | PRN

## 2021-12-11 MED ORDER — ONDANSETRON HCL 4 MG/2ML IJ SOLN
INTRAMUSCULAR | Status: DC | PRN
Start: 2021-12-11 — End: 2021-12-11
  Administered 2021-12-11: 4 mg via INTRAVENOUS

## 2021-12-11 MED ORDER — ASPIRIN 81 MG PO CHEW: 81.0000 mg | CHEWABLE_TABLET | Freq: Two times a day (BID) | ORAL | 0 refills | Status: AC

## 2021-12-11 MED ORDER — PROPOFOL 10 MG/ML IV BOLUS
INTRAVENOUS | Status: AC
Start: 2021-12-11 — End: ?
  Filled 2021-12-11: qty 20

## 2021-12-11 MED ORDER — ALBUTEROL SULFATE (2.5 MG/3ML) 0.083% IN NEBU
2.5000 mg | INHALATION_SOLUTION | Freq: Once | RESPIRATORY_TRACT | Status: AC
Start: 2021-12-11 — End: 2021-12-11
  Administered 2021-12-11: 2.5 mg via RESPIRATORY_TRACT

## 2021-12-11 MED ORDER — METOCLOPRAMIDE HCL 5 MG PO TABS: 5.0000 mg | ORAL_TABLET | Freq: Three times a day (TID) | ORAL | Status: DC | PRN

## 2021-12-11 MED ORDER — FENTANYL CITRATE (PF) 100 MCG/2ML IJ SOLN: INTRAMUSCULAR | Status: DC | PRN

## 2021-12-11 MED ORDER — ONDANSETRON HCL 4 MG/2ML IJ SOLN: 4.0000 mg | Freq: Once | INTRAMUSCULAR | Status: DC | PRN

## 2021-12-11 MED ORDER — AMLODIPINE BESYLATE 10 MG PO TABS: 10.0000 mg | ORAL_TABLET | Freq: Every day | ORAL | Status: DC

## 2021-12-11 MED ORDER — PAROXETINE HCL 20 MG PO TABS: 20.0000 mg | ORAL_TABLET | ORAL | Status: DC

## 2021-12-11 MED ORDER — ROCURONIUM BROMIDE 10 MG/ML (PF) SYRINGE
PREFILLED_SYRINGE | INTRAVENOUS | Status: AC
  Filled 2021-12-11: qty 10

## 2021-12-11 SURGICAL SUPPLY — 67 items
BASEPLATE GLENOSPHERE 25 STD (Miscellaneous) ×1 IMPLANT
BIT DRILL 3.2 PERIPHERAL SCREW (BIT) ×1 IMPLANT
BLADE HEX COATED 2.75 (ELECTRODE) IMPLANT
BLADE SAW SAG 73X25 THK (BLADE) ×1
BLADE SAW SGTL 73X25 THK (BLADE) ×1 IMPLANT
BLADE SURG 10 STRL SS (BLADE) IMPLANT
BLADE SURG 15 STRL LF DISP TIS (BLADE) IMPLANT
BLADE SURG 15 STRL SS (BLADE)
BNDG COHESIVE 4X5 TAN ST LF (GAUZE/BANDAGES/DRESSINGS) IMPLANT
BRUSH SCRUB EZ PLAIN DRY (MISCELLANEOUS) ×2 IMPLANT
CHLORAPREP W/TINT 26 (MISCELLANEOUS) ×2 IMPLANT
COOLER ICEMAN CLASSIC (MISCELLANEOUS) ×2 IMPLANT
COVER BACK TABLE 60X90IN (DRAPES) ×2 IMPLANT
COVER MAYO STAND STRL (DRAPES) ×2 IMPLANT
DRAPE IMP U-DRAPE 54X76 (DRAPES) ×2 IMPLANT
DRAPE INCISE IOBAN 66X45 STRL (DRAPES) ×2 IMPLANT
DRAPE U-SHAPE 76X120 STRL (DRAPES) ×4 IMPLANT
DRSG AQUACEL AG ADV 3.5X 6 (GAUZE/BANDAGES/DRESSINGS) ×2 IMPLANT
ELECT BLADE 4.0 EZ CLEAN MEGAD (MISCELLANEOUS) ×2
ELECT REM PT RETURN 9FT ADLT (ELECTROSURGICAL) ×2
ELECTRODE BLDE 4.0 EZ CLN MEGD (MISCELLANEOUS) ×1 IMPLANT
ELECTRODE REM PT RTRN 9FT ADLT (ELECTROSURGICAL) ×1 IMPLANT
FACESHIELD WRAPAROUND (MASK) ×4 IMPLANT
FACESHIELD WRAPAROUND OR TEAM (MASK) ×2 IMPLANT
GLENOSPHERE REV SHOULDER 36 (Joint) ×1 IMPLANT
GLOVE SRG 8 PF TXTR STRL LF DI (GLOVE) ×1 IMPLANT
GLOVE SURG ENC MOIS LTX SZ6.5 (GLOVE) ×4 IMPLANT
GLOVE SURG LTX SZ8 (GLOVE) ×4 IMPLANT
GLOVE SURG UNDER POLY LF SZ6.5 (GLOVE) ×2 IMPLANT
GLOVE SURG UNDER POLY LF SZ8 (GLOVE) ×1
GOWN STRL REUS W/ TWL LRG LVL3 (GOWN DISPOSABLE) ×2 IMPLANT
GOWN STRL REUS W/TWL LRG LVL3 (GOWN DISPOSABLE) ×2
GOWN STRL REUS W/TWL XL LVL3 (GOWN DISPOSABLE) ×2 IMPLANT
GUIDEWIRE GLENOID 2.5X220 (WIRE) ×1 IMPLANT
HANDPIECE INTERPULSE COAX TIP (DISPOSABLE) ×1
HUMERAL STEM AEQUALIS 3BX74MM (Stem) ×2 IMPLANT
INSERT HUMERAL 36X6MM 12.5DEG (Insert) ×1 IMPLANT
KIT SHOULDER STAB MARCO (KITS) ×2 IMPLANT
MANIFOLD NEPTUNE II (INSTRUMENTS) ×2 IMPLANT
PACK BASIN DAY SURGERY FS (CUSTOM PROCEDURE TRAY) ×2 IMPLANT
PACK SHOULDER (CUSTOM PROCEDURE TRAY) ×2 IMPLANT
PAD COLD SHLDR WRAP-ON (PAD) ×2 IMPLANT
PAD ORTHO SHOULDER 7X19 LRG (SOFTGOODS) ×2 IMPLANT
PENCIL SMOKE EVACUATOR (MISCELLANEOUS) IMPLANT
RESTRAINT HEAD UNIVERSAL NS (MISCELLANEOUS) ×2 IMPLANT
SCREW 5.0X18 (Screw) ×1 IMPLANT
SCREW 5.0X38 SMALL F/PERFORM (Screw) ×1 IMPLANT
SCREW BONE THREAD 6.5X35 (Screw) ×1 IMPLANT
SET HNDPC FAN SPRY TIP SCT (DISPOSABLE) ×1 IMPLANT
SHEET MEDIUM DRAPE 40X70 STRL (DRAPES) ×2 IMPLANT
SLEEVE SCD COMPRESS KNEE MED (STOCKING) ×2 IMPLANT
SPIKE FLUID TRANSFER (MISCELLANEOUS) IMPLANT
SPONGE T-LAP 18X18 ~~LOC~~+RFID (SPONGE) ×2 IMPLANT
STEM HUMERAL AEQUALIS 3BX74MM (Stem) IMPLANT
STRIP CLOSURE SKIN 1/2X4 (GAUZE/BANDAGES/DRESSINGS) ×2 IMPLANT
SUT ETHIBOND 2 V 37 (SUTURE) ×2 IMPLANT
SUT ETHIBOND NAB CT1 #1 30IN (SUTURE) ×2 IMPLANT
SUT FIBERWIRE #5 38 CONV NDL (SUTURE) ×8
SUT MNCRL AB 4-0 PS2 18 (SUTURE) ×2 IMPLANT
SUT VIC AB 0 CT1 27 (SUTURE)
SUT VIC AB 0 CT1 27XBRD ANBCTR (SUTURE) IMPLANT
SUT VIC AB 3-0 SH 27 (SUTURE) ×1
SUT VIC AB 3-0 SH 27X BRD (SUTURE) ×1 IMPLANT
SUTURE FIBERWR #5 38 CONV NDL (SUTURE) ×4 IMPLANT
TOWEL GREEN STERILE FF (TOWEL DISPOSABLE) ×6 IMPLANT
TRAY SHOULDER REV OFFSET 1.5 (Joint) ×1 IMPLANT
TUBE SUCTION HIGH CAP CLEAR NV (SUCTIONS) ×2 IMPLANT

## 2021-12-11 NOTE — Progress Notes (Signed)
Assisted Dr. Brock with right, interscalene , ultrasound guided block. Side rails up, monitors on throughout procedure. See vital signs in flow sheet. Tolerated Procedure well. 

## 2021-12-11 NOTE — Interval H&P Note (Signed)
All questions answered, patient wants to proceed with procedure. ? ?

## 2021-12-11 NOTE — Anesthesia Procedure Notes (Signed)
Anesthesia Regional Block: Interscalene brachial plexus block  ? ?Pre-Anesthetic Checklist: , timeout performed,  Correct Patient, Correct Site, Correct Laterality,  Correct Procedure, Correct Position, site marked,  Risks and benefits discussed,  Surgical consent,  Pre-op evaluation,  At surgeon's request and post-op pain management ? ?Laterality: Right ? ?Prep: chloraprep     ?  ?Needles:  ?Injection technique: Single-shot ? ?Needle Type: Echogenic Needle   ? ? ?Needle Length: 5cm  ?Needle Gauge: 21  ? ? ? ?Additional Needles: ? ? ?Narrative:  ?Start time: 12/11/2021 11:02 AM ?End time: 12/11/2021 11:05 AM ?Injection made incrementally with aspirations every 5 mL. ? ?Performed by: Personally  ?Anesthesiologist: Beryle Lathe, MD ? ?Additional Notes: ?No pain on injection. No increased resistance to injection. Injection made in 5cc increments. Good needle visualization. Patient tolerated the procedure well. ? ? ? ? ?

## 2021-12-11 NOTE — Transfer of Care (Signed)
Immediate Anesthesia Transfer of Care Note ? ?Patient: Kawehi Hostetter ? ?Procedure(s) Performed: REVERSE SHOULDER ARTHROPLASTY (Right: Shoulder) ? ?Patient Location: PACU ? ?Anesthesia Type:General and Regional ? ?Level of Consciousness: awake, alert  and oriented ? ?Airway & Oxygen Therapy: Patient Spontanous Breathing and Patient connected to face mask oxygen ? ?Post-op Assessment: Report given to RN and Post -op Vital signs reviewed and stable ? ?Post vital signs: Reviewed and stable ? ?Last Vitals:  ?Vitals Value Taken Time  ?BP 114/59 12/11/21 1334  ?Temp    ?Pulse 55 12/11/21 1338  ?Resp 14 12/11/21 1338  ?SpO2 94 % 12/11/21 1338  ?Vitals shown include unvalidated device data. ? ?Last Pain:  ?Vitals:  ? 12/11/21 1032  ?TempSrc: Oral  ?PainSc: 0-No pain  ?   ? ?Patients Stated Pain Goal: 4 (12/11/21 1032) ? ?Complications: No notable events documented. ?

## 2021-12-11 NOTE — Anesthesia Procedure Notes (Signed)
Procedure Name: Intubation ?Date/Time: 12/11/2021 12:36 PM ?Performed by: Verita Lamb, CRNA ?Pre-anesthesia Checklist: Patient identified, Emergency Drugs available, Suction available and Patient being monitored ?Patient Re-evaluated:Patient Re-evaluated prior to induction ?Oxygen Delivery Method: Circle system utilized ?Preoxygenation: Pre-oxygenation with 100% oxygen ?Induction Type: IV induction ?Ventilation: Mask ventilation without difficulty ?Laryngoscope Size: Mac and 3 ?Tube type: Oral ?Tube size: 7.0 mm ?Number of attempts: 1 ?Airway Equipment and Method: Stylet and Oral airway ?Placement Confirmation: ETT inserted through vocal cords under direct vision, positive ETCO2, breath sounds checked- equal and bilateral and CO2 detector ?Secured at: 22 cm ?Tube secured with: Tape ?Dental Injury: Teeth and Oropharynx as per pre-operative assessment  ? ? ? ? ?

## 2021-12-11 NOTE — Anesthesia Postprocedure Evaluation (Signed)
Anesthesia Post Note ? ?Patient: Kristy Hays ? ?Procedure(s) Performed: REVERSE SHOULDER ARTHROPLASTY (Right: Shoulder) ? ?  ? ?Patient location during evaluation: PACU ?Anesthesia Type: General and Regional ?Level of consciousness: awake and alert ?Pain management: pain level controlled ?Vital Signs Assessment: post-procedure vital signs reviewed and stable ?Respiratory status: spontaneous breathing, nonlabored ventilation and patient connected to nasal cannula oxygen (Pt continues to require supplemental O2 to keep Sats >90%) ?Cardiovascular status: blood pressure returned to baseline and stable ?Postop Assessment: no apparent nausea or vomiting ?Anesthetic complications: no ?Comments: Pt is no distress.  Breathing is not labored. CXR is unremarkable.  However, SaO2 drops to low 80s when she is on room air.  Unable to wean supplemental oxygen.  She has received nebulized albuterol and encouraged to use the incentive spirometer.  She is a former smoker.  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 ? ?Pt will stay over night for continued observation and treatment.  ? ? ?No notable events documented. ? ?Last Vitals:  ?Vitals:  ? 12/11/21 1546 12/11/21 1547  ?BP:    ?Pulse: 65 63  ?Resp:    ?Temp:    ?SpO2: (!) 87% (!) 76%  ?  ?Last Pain:  ?Vitals:  ? 12/11/21 1511  ?TempSrc:   ?PainSc: 0-No pain  ? ? ?  ?  ?  ?  ?  ?  ? ?[name] ? ? ? ? ?

## 2021-12-11 NOTE — Anesthesia Preprocedure Evaluation (Addendum)
Anesthesia Evaluation  ?Patient identified by MRN, date of birth, ID band ?Patient awake ? ? ? ?Reviewed: ?Allergy & Precautions, NPO status , Patient's Chart, lab work & pertinent test results, reviewed documented beta blocker date and time  ? ?History of Anesthesia Complications ?Negative for: history of anesthetic complications ? ?Airway ?Mallampati: II ? ?TM Distance: >3 FB ?Neck ROM: Full ? ? ? Dental ? ?(+) Dental Advisory Given, Partial Lower ?  ?Pulmonary ?former smoker,  ?  ?Pulmonary exam normal ? ? ? ? ? ? ? Cardiovascular ?hypertension, Pt. on medications and Pt. on home beta blockers ?Normal cardiovascular exam ? ? ?  ?Neuro/Psych ?PSYCHIATRIC DISORDERS Anxiety Depression negative neurological ROS ?   ? GI/Hepatic ?negative GI ROS, Neg liver ROS,   ?Endo/Other  ?diabetes ?Obesity ? ? Renal/GU ?Renal InsufficiencyRenal disease  ? ?  ?Musculoskeletal ? ?(+) Arthritis ,  ? Abdominal ?  ?Peds ? Hematology ?negative hematology ROS ?(+)   ?Anesthesia Other Findings ? ? Reproductive/Obstetrics ? ?  ? ? ? ? ? ? ? ? ? ? ? ? ? ?  ?  ? ? ? ? ? ? ? ?Anesthesia Physical ?Anesthesia Plan ? ?ASA: 3 ? ?Anesthesia Plan: General  ? ?Post-op Pain Management: Regional block* and Tylenol PO (pre-op)*  ? ?Induction: Intravenous ? ?PONV Risk Score and Plan: 3 and Treatment may vary due to age or medical condition, Ondansetron and Dexamethasone ? ?Airway Management Planned: Oral ETT ? ?Additional Equipment: None ? ?Intra-op Plan:  ? ?Post-operative Plan: Extubation in OR ? ?Informed Consent: I have reviewed the patients History and Physical, chart, labs and discussed the procedure including the risks, benefits and alternatives for the proposed anesthesia with the patient or authorized representative who has indicated his/her understanding and acceptance.  ? ? ? ?Dental advisory given ? ?Plan Discussed with: CRNA and Anesthesiologist ? ?Anesthesia Plan Comments:   ? ? ? ? ? ?Anesthesia Quick  Evaluation ? ?

## 2021-12-11 NOTE — Op Note (Signed)
Orthopaedic Surgery Operative Note (CSN: [id number]) ? ?Kristy Hays  Apr 07, 1946 ?Date of Surgery: 12/11/2021 ? ? ?Diagnoses:  ?Right proximal humerus fracture with cuff avulsion in the setting of primary glenohumeral arthritis.  Preoperative axillary nerve neuropraxia ? ?Procedure: ?Right reverse total Shoulder Arthroplasty ?  ?Operative Finding ?Successful completion of planned procedure.  Patient had baseline glenohumeral arthritis and her dislocation had caused an avulsion of her tuberosities constituting a cuff failure.  Her subscapularis was ruptured as well.  Biceps was medialized.   ? ?Preoperatively the patient's axillary nerve function was marginal.  We did discuss this at length including the possibility of incomplete resolution of her axillary nerve palsy.  She did seem to have a flicker of her deltoid preoperatively.  We attempted to perform a tug test however there is significant subcoracoid scarring and we felt that further traction in this area was not in the patient's best interest. ? ?Based on her preoperative axillary nerve palsy she has a higher than normal risk of instability and poor function. ? ?Post-operative plan: The patient will be NWB in sling.  The patient will be will be discharged from PACU if continues to be stable as was plan prior to surgery.  DVT prophylaxis Aspirin 81 mg twice daily for 6 weeks.  Pain control with PRN pain medication preferring oral medicines.  Follow up plan will be scheduled in approximately 7 days for incision check and XR.  Physical therapy to start after first visit. ? ?Implants: Tornier size 3B short stem, 0 low offset tray with a +6 polyethylene, 36 standard glenosphere and a 25 standard baseplate with a 35 center screw and 2 peripheral locking screws ? ?Post-Op Diagnosis: Same ?Surgeons:Primary: Bjorn Pippin, MD ?Assistants:Caroline McBane PA-C ?Location: [facility] OR ROOM 6 ?Anesthesia: General with Exparel Interscalene ?Antibiotics: Ancef 2g preop, Vancomycin  1000mg  locally ?Tourniquet time: None ?Estimated Blood Loss: 100 ?Complications: None ?Specimens: None ?Implants: ?Implant Name Type Inv. Item Serial No. Manufacturer Lot No. LRB No. Used Action  ?BASEPLATE GLENOSPHERE STD - Miscellaneous BASEPLATE GLENOSPHERE [id number] STD [employer]  Right 1 Implanted  ?GLENOSPHERE REV SHOULDER 36 - [id number] Joint GLENOSPHERE REV SHOULDER 36 [id number] [employer]  Right 1 Implanted  ?BONE SCREW THREAD 6.5X35MM - [id number] Screw BONE SCREW THREAD 6.5X35MM  [employer]  Right 1 Implanted  ?SCREW 5.0X38 SMALL F/PERFORM - [id number] Screw SCREW 5.0X38 SMALL F/PERFORM  [employer]  Right 1 Implanted  ?SCREW 5.0X18 - [id number] Screw SCREW 5.0X18  [employer]  Right 1 Implanted  ?TRAY SHOULDER REV OFFSET 1.5 - [id number] Joint TRAY SHOULDER REV OFFSET 1.5 [id number] [employer]  Right 1 Implanted  ?INSERT HUMERAL 36X6MM 12.5DEG - [id number] Insert INSERT HUMERAL 36X6MM 12.5DEG [id number] [employer]  Right 1 Implanted  ?HUMERAL STEM AEQUALIS 3BX74MM - [id number] Stem HUMERAL STEM [id number] 3BX74MM Oliva Bustard [employer]  Right 1 Implanted  ? ? ?Indications for Surgery:   ?Kristy Hays is a 76 y.o. 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  Benefits and risks of operative and nonoperative management were discussed prior to surgery with patient/guardian(s) and informed consent form was completed.  Infection and need for further surgery were discussed as was prosthetic stability and cuff issues.  We additionally specifically discussed risks of axillary nerve injury, infection, periprosthetic fracture, continued pain and longevity of implants prior to beginning procedure.   ? ? ? ?Procedure:   ?The patient was identified in the preoperative holding area where the  surgical site was marked. Block placed by anesthesia with exparel.  The patient was taken to the OR where a procedural timeout was called  and the above noted anesthesia was induced.  The patient was positioned beachchair on allen table with spider arm positioner.  Preoperative antibiotics were dosed.  The patient's right shoulder was prepped and draped in the usual sterile fashion.  A second preoperative timeout was called.     ? ? ?Standard deltopectoral approach was performed with a #10 blade. We dissected down to the subcutaneous tissues and the cephalic vein was taken laterally with the deltoid. Clavipectoral fascia was incised in line with the incision. Deep retractors were placed. The long of the biceps tendon was identified and there was significant tenosynovitis present.  Tenodesis was performed to the pectoralis tendon with #2 Ethibond. The remaining biceps was followed up into the rotator interval where it was released.  ? ?The subscapularis was taken down in a full thickness layer with capsule along the humeral neck extending inferiorly around the humeral head. We continued releasing the capsule directly off of the osteophytes inferiorly all the way around the corner. This allowed [place] to dislocate the humeral head.  ? ?The humeral head had evidence of severe osteoarthritic wear with full-thickness cartilage loss and exposed subchondral bone. There was significant flattening of the humeral head.  ? ?The rotator cuff was carefully examined and noted to be irreperably torn.  The decision was confirmed that a reverse total shoulder was indicated for this patient. ? ?There were osteophytes along the inferior humeral neck. The osteophytes were removed with an osteotome and a rongeur.  Osteophytes were removed with a rongeur and an osteotome and the anatomic neck was well visualized.    ? ?A humeral cutting guide was inserted down the intramedullary canal. The version was set at 20? of retroversion. Humeral osteotomy was performed with an oscillating saw. The head fragment was passed off the back table. A starter awl was used to open the humeral  canal. We next used T-handle straight sound reamers to ream up to an appropriate fit. A chisel was used to remove proximal humeral bone. We then broached starting with a size one broach and broaching up to 3 which obtained an appropriate fit. The broach handle was removed. A cut protector was placed. The broach handle was removed and a cut protector was placed. The humerus was retracted posteriorly and we turned our attention to glenoid exposure. ? ?The subscapularis was again identified and immediately we took care to attempt to palpate the axillary nerve however there was significant scarring and this was unable to be performed.  We then released the SGHL with bovie cautery prior to placing a curved mayo at the junction of the anterior glenoid well above the axillary nerve and bluntly dissecting the subscapularis from the capsule.  We then carefully protected the axillary nerve as we gently released the inferior capsule to fully mobilize the subscapularis.  An anterior deltoid retractor was then placed as well as a small Hohmann retractor superiorly. ?  ?The glenoid was inspected and had evidence of severe osteoarthritic wear with full-thickness cartilage loss and exposed subchondral bone.  ? ? ?The remaining labrum was removed circumferentially taking great care not to disrupt the posterior capsule.  ? ?The glenoid drill guide was placed and used to drill a guide pin in the center, inferior position. The glenoid face was then reamed concentrically over the guide wire. The center hole was drilled over the  guidepin in a near anatomic angle of version. Next the glenoid vault was drilled back to a depth of 35 mm.  We tapped and then placed a 25mm size baseplate with additional 0mm lateralization was selected with a 6.5 mm x 35 mm length central screw.  The base plate was screwed into the glenoid vault obtaining secure fixation. We next placed superior and inferior locking screws for additional fixation.  Next a 36 mm  glenosphere was selected and impacted onto the baseplate. The center screw was tightened. ? ?We turned attention back to the humeral side. The cut protector was removed. We trialed with multiple size tr

## 2021-12-12 ENCOUNTER — Encounter (HOSPITAL_BASED_OUTPATIENT_CLINIC_OR_DEPARTMENT_OTHER): Payer: Self-pay | Admitting: Orthopaedic Surgery

## 2021-12-12 ENCOUNTER — Encounter: Payer: Self-pay | Admitting: Rehabilitative and Restorative Service Providers"

## 2021-12-12 DIAGNOSIS — S42201A Unspecified fracture of upper end of right humerus, initial encounter for closed fracture: Secondary | ICD-10-CM | POA: Diagnosis not present

## 2021-12-12 NOTE — Progress Notes (Signed)
? ?  ORTHOPAEDIC PROGRESS NOTE ? ?s/p Procedure(s): ?REVERSE SHOULDER ARTHROPLASTY ? ?SUBJECTIVE: ?Reports mild pain about operative site. Her nerve block has begun to wear off. She is still having shortness of breath. She is currently on nasal cannula. Does not normally have issues with shortness of breath and is not on home oxygen. ? ?No chest pain. No nausea/vomiting. No other complaints. ? ?OBJECTIVE: ?PE: ?General: sitting up in hospital bed, NAD ?Cardiac: regular rate ?Pulmonary: on nasal cannula  ?RUE: Dressing CDI and sling well fitting. + Motor in  AIN, PIN, Ulnar distributions. She states axillary nerve sensation is symmetric to the contralateral side.  Sensation intact in medial, radial, and ulnar distributions. Well perfused digits.  ? ? ?Vitals:  ? 12/12/21 0600 12/12/21 0615  ?BP: (!) 176/81   ?Pulse: 73   ?Resp: 18   ?Temp: 98.6 ?F (37 ?C)   ?SpO2: 93% (!) 86%  ? ? ? ?ASSESSMENT: ?Kristy Hays is a 76 y.o. female POD#1, with low SaO2 requiring nasal cannula ? ?PLAN: ?Weightbearing: NWB RUE ?Insicional and dressing care: Reinforce dressings as needed ?Orthopedic device(s):  Sling ?Showering: Post-op day #2 with assistance ?VTE prophylaxis: Aspirin 81 mg BID ?Pain control: PRN pain medications, minimize narcotics as able ?Follow - up plan: 1 week ?Contact information:  After hours and holidays please check [email or web address] for group call information for [facility] ? ?Patient is still requiring nasal cannula. He sats have improved since yesterday, but she still drops when ambulating on room air. We will continue to monitor closely this morning and afternoon to determine if she is safe to discharge home. Continue to try to wean to room air and monitor patient's O2 with rest and ambulation. ? ?Alfonse Alpers,  PA-C ?12/12/2021 ? ? ?

## 2021-12-12 NOTE — Discharge Summary (Signed)
? ?Patient ID: ?Kristy Hays ?MRN: [id number] ?DOB/AGE: 1945/10/22 76 y.o. ? ?Admit date: 12/11/2021 ?Discharge date: 12/12/2021 ? ?Admission Diagnoses: ?Right proximal humerus fracture with cuff avulsion in the setting of primary glenohumeral arthritis.  Preoperative axillary nerve neuropraxia ? ?Discharge Diagnoses:  ?Principal Problem: ?  Status post reverse total arthroplasty of right shoulder ? ? ?Past Medical History:  ?Diagnosis Date  ? Anxiety   ? Arthritis   ? Depression   ? Glaucoma   ? Hyperlipidemia   ? Hypertension   ? Proximal humerus fracture   ? right  ? ? ? ?Procedures Performed: Right reverse total shoulder arthroplasty  ? ?Discharged Condition: stable ? ?Hospital Course: Patient brought in as an outpatient for surgery.  Tolerated procedure well. Postoperatively in PACU her SaO2 drops to low 80s when she is on room air. She was unable to wean supplemental oxygen.  She has received nebulized albuterol and encouraged to use the incentive spirometer.  Patient was in no distress and post-op chest x-ray was unremarkable. She was kept over night for continued observation and treatment. She was able to be weaned off room air and maintaining normal O2 stats this morning. She was found to be stable for DC home the day after surgery.  Patient was instructed on specific activity restrictions and all questions were answered. ? ?Consults: None ? ?Significant Diagnostic Studies: No additional pertinent studies ? ?Treatments: Surgery ? ?Discharge Exam: ?Cardiac: regular rate ?Pulmonary: on nasal cannula  ?RUE: Dressing CDI and sling well fitting. + Motor in  AIN, PIN, Ulnar distributions. She states axillary nerve sensation is symmetric to the contralateral side.  Sensation intact in medial, radial, and ulnar distributions. Well perfused digits.  ? ?Disposition: Discharge disposition: 01-Home or Self Care ? ? ? ? ? ? ?Discharge Instructions   ? ? Call MD for:  redness, tenderness, or signs of infection (pain,  swelling, redness, odor or green/yellow discharge around incision site)   Complete by: As directed ?  ? Call MD for:  severe uncontrolled pain   Complete by: As directed ?  ? Call MD for:  temperature >100.4   Complete by: As directed ?  ? Diet - low sodium heart healthy   Complete by: As directed ?  ? ?  ? ?Allergies as of 12/12/2021   ? ?   Reactions  ? Antihistamines, Chlorpheniramine-type Other (See Comments)  ? Makes pt Hyper  ? Benadryl [diphenhydramine Hcl (sleep)]   ? Makes her hyper  ? Penicillins   ? ?  ? ?  ?Medication List  ?  ? ?TAKE these medications   ? ?acetaminophen 500 MG tablet ?Commonly known as: TYLENOL ?Take 2 tablets (1,000 mg total) by mouth every 8 (eight) hours for 14 days. ?  ?amLODipine 10 MG tablet ?Commonly known as: NORVASC ?Take 10 mg by mouth daily. ?  ?aspirin 81 MG chewable tablet ?Commonly known as: Aspirin Childrens ?Chew 1 tablet (81 mg total) by mouth 2 (two) times daily. For 6 weeks for DVT prophylaxis after surgery ?  ?Biotin 86761 MCG Tabs ?Take by mouth. ?  ?Calcium Carbonate-Vitamin D 600-400 MG-UNIT tablet ?Take 1 tablet by mouth 2 (two) times daily. ?  ?carvedilol 25 MG tablet ?Commonly known as: COREG ?TAKE 1 TABLET BY MOUTH TWICE DAILY WITH A MEAL ?  ?CoQ-10 400 MG Caps ?Take by mouth. ?  ?ezetimibe 10 MG tablet ?Commonly known as: Zetia ?Take 1 tablet (10 mg total) by mouth daily. ?  ?fenofibrate 160 MG tablet ?Take  1 tablet (160 mg total) by mouth daily. ?  ?Fish Oil 1200 MG Caps ?Take by mouth. ?  ?lisinopril-hydrochlorothiazide 10-12.5 MG tablet ?Commonly known as: ZESTORETIC ?Take 1 tablet by mouth daily. ?  ?loratadine 10 MG tablet ?Commonly known as: CLARITIN ?Take 1 tablet (10 mg total) by mouth daily. ?  ?meloxicam 15 MG tablet ?Commonly known as: MOBIC ?Take 1 tablet (15 mg total) by mouth daily. For 2 weeks for pain and inflammation. Then take as needed ?What changed:  ?how much to take ?how to take this ?when to take this ?additional instructions ?   ?omeprazole 20 MG capsule ?Commonly known as: PRILOSEC ?Take 1 capsule (20 mg total) by mouth daily. To gastric protection while taking NSAIDs ?  ?ondansetron 4 MG tablet ?Commonly known as: Zofran ?Take 1 tablet (4 mg total) by mouth every 8 (eight) hours as needed for up to 7 days for nausea or vomiting. ?  ?oxyCODONE 5 MG immediate release tablet ?Commonly known as: Oxy IR/ROXICODONE ?Take 1-2 pills every 6 hrs as needed for severe pain, no more than 6 per day ?  ?PARoxetine 20 MG tablet ?Commonly known as: PAXIL ?Take 1 tablet (20 mg total) by mouth every morning. ?  ? ?  ? ? ?Alfonse Alpers, PA-C ?12/12/2021 ? ? ? ?

## 2021-12-16 ENCOUNTER — Ambulatory Visit: Payer: Medicare (Managed Care) | Admitting: Rehabilitative and Restorative Service Providers"

## 2021-12-18 ENCOUNTER — Encounter: Payer: Self-pay | Admitting: Rehabilitative and Restorative Service Providers"

## 2021-12-18 DIAGNOSIS — S42201D Unspecified fracture of upper end of right humerus, subsequent encounter for fracture with routine healing: Secondary | ICD-10-CM | POA: Diagnosis not present

## 2021-12-22 ENCOUNTER — Ambulatory Visit
Payer: Medicare (Managed Care) | Attending: Orthopaedic Surgery | Admitting: Rehabilitative and Restorative Service Providers"

## 2021-12-22 ENCOUNTER — Encounter: Payer: Self-pay | Admitting: Rehabilitative and Restorative Service Providers"

## 2021-12-22 DIAGNOSIS — R601 Generalized edema: Secondary | ICD-10-CM | POA: Diagnosis not present

## 2021-12-22 DIAGNOSIS — R29898 Other symptoms and signs involving the musculoskeletal system: Secondary | ICD-10-CM | POA: Insufficient documentation

## 2021-12-22 DIAGNOSIS — M6281 Muscle weakness (generalized): Secondary | ICD-10-CM | POA: Insufficient documentation

## 2021-12-22 DIAGNOSIS — R293 Abnormal posture: Secondary | ICD-10-CM | POA: Diagnosis not present

## 2021-12-22 DIAGNOSIS — M25511 Pain in right shoulder: Secondary | ICD-10-CM | POA: Diagnosis not present

## 2021-12-22 NOTE — Patient Instructions (Signed)
Access Code: [email or web address] ?URL: [email or web address] ?Date: 12/22/2021 ?Prepared by: Corlis Leak ? ?Exercises ?- Seated Cervical Retraction  - 2 x daily - 7 x weekly - 1-2 sets - 5-10 reps - 10 sec  hold ?- Standing Scapular Retraction  - 3 x daily - 7 x weekly - 1 sets - 10 reps - 10 sec  hold ?- Seated Elbow Flexion and Extension AROM  - 2 x daily - 7 x weekly - 1 sets - 10 reps - 2-3 sec  hold ?- Seated Forearm Pronation and Supination AROM  - 2 x daily - 7 x weekly - 1 sets - 10 reps - 2-3 sec  hold ?- Gripping Sponge Neutral  - 2-3 x daily - 7 x weekly - 1-2 min max hold ?

## 2021-12-22 NOTE — Therapy (Signed)
[place] [facility] ?[address] ?[place], [place], [address] ?Phone: [phone]   Fax:  [phone] ? ?Physical Therapy Evaluation ? ?Patient Details  ?Name: Kristy Hays ?MRN: [id number] ?Date of Birth: 1946-05-22 ?Referring Provider (PT): Dr Ramond Marrowax Varkey ? ? ?Encounter Date: 12/22/2021 ? ? PT End of Session - 12/22/21 1153   ? ? Visit Number 1   ? Number of Visits 20   ? Date for PT Re-Evaluation 03/16/22   ? PT Start Time 1147   ? PT Stop Time 1235   ? PT Time Calculation (min) 48 min   ? Activity Tolerance Patient tolerated treatment well   ? ?  ?  ? ?  ? ? ?Past Medical History:  ?Diagnosis Date  ? Anxiety   ? Arthritis   ? Depression   ? Glaucoma   ? Hyperlipidemia   ? Hypertension   ? Proximal humerus fracture   ? right  ? ? ?Past Surgical History:  ?Procedure Laterality Date  ? ABDOMINAL HYSTERECTOMY    ? HIP FRACTURE SURGERY Right   ? REVERSE SHOULDER ARTHROPLASTY Right 12/11/2021  ? Procedure: REVERSE SHOULDER ARTHROPLASTY;  Surgeon: Bjorn PippinVarkey, Dax T, MD;  Location: [facility];  Service: Orthopedics;  Laterality: Right;  ? ? ?There were no vitals filed for this visit. ? ? ? Subjective Assessment - 12/22/21 1156   ? ? Subjective Patient reports that she continued to pain and weakness in the Rt shoulder. She underwent Rt reverse TSA 12/11/21 with no complications.   ? Pertinent History Fx Rt hip with ORIF 2019   ? Patient Stated Goals to use Rt arm normally again   ? Currently in Pain? No/denies   ? Pain Score 0-No pain   ? Pain Location Shoulder   ? Pain Orientation Right   ? Pain Type Surgical pain   ? ?  ?  ? ?  ? ? ? ? ? [facility] PT Assessment - 12/22/21 0001   ? ?  ? Assessment  ? Medical Diagnosis Fracture/dislocation Rt shoulder   ? Referring Provider (PT) Dr Ramond Marrowax Varkey   ? Onset Date/Surgical Date 12/11/21   fall with injury 10/27/21  ? Hand Dominance Right   ? Next MD Visit 01/08/22   ? Prior Therapy for fx hip   ?  ? Precautions  ? Precautions  Shoulder   ? Shoulder Interventions Shoulder sling/immobilizer   ? Precaution Comments post op reverse TSA   ?  ? Balance Screen  ? Has the patient fallen in the past 6 months Yes   ? How many times? 1   ? Has the patient had a decrease in activity level because of a fear of falling?  No   ? Is the patient reluctant to leave their home because of a fear of falling?  No   ?  ? Home Environment  ? Living Environment Private residence   ? Living Arrangements Alone   ? Available Help at Discharge Family;Friend(s)   ?  ? Prior Function  ? Level of Independence Independent   ? Vocation Retired   ? Vocation Requirements administrative work retired ~ 15 years   ? Leisure household chores; cooking; cards and games with neighbors; walking dog daily ~ 1 mile x 2/day   ?  ? Observation/Other Assessments  ? Focus on Therapeutic Outcomes (FOTO)  21   ?  ? Sensation  ? Additional Comments WNL's per pt report   ?  ? AROM  ?  Overall AROM Comments assessed in standing   ? Left Shoulder Extension 49 Degrees   ? Left Shoulder Flexion 132 Degrees   ? Left Shoulder ABduction 139 Degrees   ? Left Shoulder Internal Rotation --   thumb to T10-11  ? Left Shoulder External Rotation 40 Degrees   ?  ? PROM  ? Right Shoulder Flexion 76 Degrees   ? Right Shoulder ABduction 45 Degrees   in scapular plane  ? Right Shoulder External Rotation 18 Degrees   in scapular plane  ?  ? Strength  ? Overall Strength Comments not tested resistively   ?  ? Palpation  ? Palpation comment muscular tightness Rt pecs; upper trap; leveator; teres; deltoid; biceps   ? ?  ?  ? ?  ? ? ? ? ? ? ? ? ? ? ? ? ? ?Objective measurements completed on examination: See above findings.  ? ? ? ? ? [facility] Adult PT Treatment/Exercise - 12/22/21 0001   ? ?  ? Elbow Exercises  ? Elbow Flexion --   elbow flexion and extension x 10; forearm supination/pronation x 10 reps; grip with yellow star gripper x ~ 1 min  ?  ? Shoulder Exercises: Standing  ? Other Standing Exercises scap squeeze 10  x 5 sec hold   ?  ? Moist Heat Therapy  ? Number Minutes Moist Heat 10 Minutes   ? Moist Heat Location Shoulder   ?  ? Manual Therapy  ? Soft tissue mobilization soft tissue mobilization to Rt shoulder girdle   ? Passive ROM PROM Rt shoulder in flexion; scaption; ER in scapular plane - all motions within tissue limits and pt tolerance   ? ?  ?  ? ?  ? ? ? ? ? ? ? ? ? ? PT Education - 12/22/21 1243   ? ? Education Details POC HEP   ? Person(s) Educated Patient   ? Methods Explanation;Demonstration;Tactile cues;Verbal cues   ? Comprehension Verbalized understanding;Returned demonstration;Verbal cues required;Tactile cues required   ? ?  ?  ? ?  ? ? ? PT Short Term Goals - 12/22/21 1245   ? ?  ? PT SHORT TERM GOAL #1  ? Title HEP per protocol   ? Time 6   ? Period Weeks   ? Status New   ? Target Date 02/02/22   ?  ? PT SHORT TERM GOAL #2  ? Title Increase PROM Rt shoulder per protocol   ? Time 6   ? Period Weeks   ? Status New   ? Target Date 02/02/22   ? ?  ?  ? ?  ? ? ? ? PT Long Term Goals - 12/22/21 1246   ? ?  ? PT LONG TERM GOAL #1  ? Title Improve posture and alignment with patient to demonstrate improved upright posture with posterior shoudler girdle musculature engaged and shoulders level in sitting and standing; Rt UE at side normally   ? Time 12   ? Period Weeks   ? Status New   ? Target Date 03/16/22   ?  ? PT LONG TERM GOAL #2  ? Title Increase AROM Rt shoulder to WFL's allowing patient to perform all normal functional activities for ADL's and light household chores   ? Time 12   ? Period Weeks   ? Status New   ? Target Date 03/16/22   ?  ? PT LONG TERM GOAL #3  ? Title Increased functional strength Rt UE  to 4/5 to 4+/5   ? Time 12   ? Status New   ? Target Date 03/16/22   ?  ? PT LONG TERM GOAL #4  ? Title Independent in HEP (including aquatic exercise program as indicated)   ? Time 12   ? Period Weeks   ? Status New   ? Target Date 03/16/22   ?  ? PT LONG TERM GOAL #5  ? Title Improve functional  limitation score to 31   ? Time 12   ? Period Weeks   ? Status New   ? Target Date 03/16/22   ? ?  ?  ? ?  ? ? ? ? ? ? ? ? ? Plan - 12/22/21 1232   ? ? Clinical Impression Statement Patient presents s/p Rt reverse TSA 12/11/21 following injury to Rt shoulder 10/27/21. She has minimal to no pain in the Rt shoulder and remains in sling per post op protocol. Patient has poor thoracic and scapular posture and alignment; limited ROM Rt shoulder; decreased strength; limited ADL's and function Rt UE. She will benefit from PT to address deficits as identified.   ? Stability/Clinical Decision Making Stable/Uncomplicated   ? Clinical Decision Making Low   ? Rehab Potential Good   ? PT Frequency 2x / week   ? PT Duration 12 weeks   ? PT Treatment/Interventions ADLs/Self Care Home Management;Aquatic Therapy;Cryotherapy;Electrical Stimulation;Iontophoresis 4mg /ml Dexamethasone;Moist Heat;Ultrasound;Therapeutic activities;Therapeutic exercise;Neuromuscular re-education;Patient/family education;Manual techniques;Passive range of motion;Dry needling;Taping;Vasopneumatic Device   ? PT Next Visit Plan progress HEP per protocol, manual therapy, PROM, Rt shoulder stretch and strengthening per protocol   ? PT Home Exercise Plan [email or web address]   ? Consulted and Agree with Plan of Care Patient   ? ?  ?  ? ?  ? ? ?Patient will benefit from skilled therapeutic intervention in order to improve the following deficits and impairments:  Decreased range of motion, Increased fascial restricitons, Decreased activity tolerance, Impaired UE functional use, Pain, Impaired flexibility, Improper body mechanics, Decreased mobility, Decreased strength, Impaired sensation, Postural dysfunction, Increased edema ? ?Visit Diagnosis: ?Acute pain of right shoulder ? ?Other symptoms and signs involving the musculoskeletal system ? ?Abnormal posture ? ?Muscle weakness (generalized) ? ?Generalized edema ? ? ? ? ?Problem List ?Patient Active Problem List  ? Diagnosis Date  Noted  ? 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 Closed fracture dislocation of shoulder, right, initial encounter 10/27/2021  ? Osteopenia 08/03/2018  ? Closed intertrochanteric

## 2021-12-23 ENCOUNTER — Encounter: Payer: Self-pay | Admitting: Rehabilitative and Restorative Service Providers"

## 2021-12-26 ENCOUNTER — Encounter: Payer: Self-pay | Admitting: Rehabilitative and Restorative Service Providers"

## 2021-12-29 ENCOUNTER — Encounter: Payer: Self-pay | Admitting: Rehabilitative and Restorative Service Providers"

## 2021-12-29 ENCOUNTER — Ambulatory Visit: Payer: Medicare (Managed Care) | Admitting: Rehabilitative and Restorative Service Providers"

## 2021-12-29 DIAGNOSIS — R601 Generalized edema: Secondary | ICD-10-CM

## 2021-12-29 DIAGNOSIS — M25511 Pain in right shoulder: Secondary | ICD-10-CM

## 2021-12-29 DIAGNOSIS — R29898 Other symptoms and signs involving the musculoskeletal system: Secondary | ICD-10-CM

## 2021-12-29 DIAGNOSIS — M6281 Muscle weakness (generalized): Secondary | ICD-10-CM

## 2021-12-29 DIAGNOSIS — R293 Abnormal posture: Secondary | ICD-10-CM

## 2021-12-29 NOTE — Therapy (Signed)
[place] [facility] ?[address] ?[place], [place], [address] ?Phone: [phone]   Fax:  [phone] ? ?Physical Therapy Treatment ? ?Patient Details  ?Name: Kristy Hays ?MRN: [id number] ?Date of Birth: 05/12/1946 ?Referring Provider (PT): Dr Ramond Marrowax Varkey ? ? ?Encounter Date: 12/29/2021 ? ? PT End of Session - 12/29/21 1520   ? ? Visit Number 2   ? Number of Visits 20   ? Date for PT Re-Evaluation 03/16/22   ? PT Start Time 1520   ? PT Stop Time 1608   ? PT Time Calculation (min) 48 min   ? ?  ?  ? ?  ? ? ?Past Medical History:  ?Diagnosis Date  ? Anxiety   ? Arthritis   ? Depression   ? Glaucoma   ? Hyperlipidemia   ? Hypertension   ? Proximal humerus fracture   ? right  ? ? ?Past Surgical History:  ?Procedure Laterality Date  ? ABDOMINAL HYSTERECTOMY    ? HIP FRACTURE SURGERY Right   ? REVERSE SHOULDER ARTHROPLASTY Right 12/11/2021  ? Procedure: REVERSE SHOULDER ARTHROPLASTY;  Surgeon: Bjorn PippinVarkey, Dax T, MD;  Location: [facility];  Service: Orthopedics;  Laterality: Right;  ? ? ?There were no vitals filed for this visit. ? ? Subjective Assessment - 12/29/21 1523   ? ? Subjective Patient reports that she has been doing well with no pain in the Rt shoulder. Has taken only one pain pill. Reverse TSA 12/11/21   ? Currently in Pain? No/denies   ? Pain Score 0-No pain   ? ?  ?  ? ?  ? ? ? ? ? [facility] PT Assessment - 12/29/21 0001   ? ?  ? Assessment  ? Medical Diagnosis Fracture/dislocation Rt shoulder   ? Referring Provider (PT) Dr Ramond Marrowax Varkey   ? Onset Date/Surgical Date 12/11/21   fall with injury 10/27/21  ? Hand Dominance Right   ? Next MD Visit 01/08/22   ? Prior Therapy for fx hip   ?  ? PROM  ? Right Shoulder Flexion 95 Degrees   ? Right Shoulder ABduction 85 Degrees   in scapular plane  ? Right Shoulder External Rotation 22 Degrees   in scapular plane  ? ?  ?  ? ?  ? ? ? ? ? ? ? ? ? ? ? ? ? ? ? ? [facility] Adult PT Treatment/Exercise - 12/29/21 0001   ? ?  ?  Shoulder Exercises: Standing  ? Other Standing Exercises scap squeeze 10 x 5 sec hold   ?  ? Shoulder Exercises: ROM/Strengthening  ? Pendulum circle CW/CCW x 20 each   ?  ? Vasopneumatic  ? Number Minutes Vasopneumatic  10 minutes   ? Vasopnuematic Location  Shoulder   ? Vasopneumatic Pressure Low   ? Vasopneumatic Temperature  34   ?  ? Manual Therapy  ? Manual Therapy --   pt supine  ? Soft tissue mobilization soft tissue mobilization to Rt shoulder girdle   ? Passive ROM PROM Rt shoulder in flexion; scaption; ER in scapular plane - all motions within tissue limits and pt tolerance   ? ?  ?  ? ?  ? ? ? ? ? ? ? ? ? ? PT Education - 12/29/21 1601   ? ? Education Details HEP   ? Person(s) Educated Patient   ? Methods Explanation;Demonstration;Tactile cues;Verbal cues;Handout   ? Comprehension Verbalized understanding;Returned demonstration;Verbal cues required;Tactile cues required   ? ?  ?  ? ?  ? ? ?  PT Short Term Goals - 12/22/21 1245   ? ?  ? PT SHORT TERM GOAL #1  ? Title HEP per protocol   ? Time 6   ? Period Weeks   ? Status New   ? Target Date 02/02/22   ?  ? PT SHORT TERM GOAL #2  ? Title Increase PROM Rt shoulder per protocol   ? Time 6   ? Period Weeks   ? Status New   ? Target Date 02/02/22   ? ?  ?  ? ?  ? ? ? ? PT Long Term Goals - 12/22/21 1246   ? ?  ? PT LONG TERM GOAL #1  ? Title Improve posture and alignment with patient to demonstrate improved upright posture with posterior shoudler girdle musculature engaged and shoulders level in sitting and standing; Rt UE at side normally   ? Time 12   ? Period Weeks   ? Status New   ? Target Date 03/16/22   ?  ? PT LONG TERM GOAL #2  ? Title Increase AROM Rt shoulder to WFL's allowing patient to perform all normal functional activities for ADL's and light household chores   ? Time 12   ? Period Weeks   ? Status New   ? Target Date 03/16/22   ?  ? PT LONG TERM GOAL #3  ? Title Increased functional strength Rt UE to 4/5 to 4+/5   ? Time 12   ? Status New   ?  Target Date 03/16/22   ?  ? PT LONG TERM GOAL #4  ? Title Independent in HEP (including aquatic exercise program as indicated)   ? Time 12   ? Period Weeks   ? Status New   ? Target Date 03/16/22   ?  ? PT LONG TERM GOAL #5  ? Title Improve functional limitation score to 31   ? Time 12   ? Period Weeks   ? Status New   ? Target Date 03/16/22   ? ?  ?  ? ?  ? ? ? ? ? ? ? ? Plan - 12/29/21 1557   ? ? Clinical Impression Statement Patient reports that she is compliant with post op shoulder restrictions and remains in her sling at home. She has minimal to no Rt shoulder pain.Tolerated pendulum and scap squeeze exercises well today. Good passive ROM noted with manual work and PROM by PT. Progressing well within protocol. Will add gentle PROM exercises for patient's HEP next visit if she is doing well. Trial of vaso to prevent soreness and address tightness/edema.   ? Rehab Potential Good   ? PT Frequency 2x / week   ? PT Duration 12 weeks   ? PT Treatment/Interventions ADLs/Self Care Home Management;Aquatic Therapy;Cryotherapy;Electrical Stimulation;Iontophoresis 4mg /ml Dexamethasone;Moist Heat;Ultrasound;Therapeutic activities;Therapeutic exercise;Neuromuscular re-education;Patient/family education;Manual techniques;Passive range of motion;Dry needling;Taping;Vasopneumatic Device   ? PT Next Visit Plan progress HEP per protocol, manual therapy, PROM, Rt shoulder stretch and strengthening per protocol   ? PT Home Exercise Plan [email or web address]   ? Consulted and Agree with Plan of Care Patient   ? ?  ?  ? ?  ? ? ?Patient will benefit from skilled therapeutic intervention in order to improve the following deficits and impairments:    ? ?Visit Diagnosis: ?Acute pain of right shoulder ? ?Other symptoms and signs involving the musculoskeletal system ? ?Abnormal posture ? ?Muscle weakness (generalized) ? ?Generalized edema ? ? ? ? ?Problem List ?Patient Active Problem List  ?  Diagnosis Date Noted  ? 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 Closed fracture dislocation of shoulder, right, initial encounter 10/27/2021  ? Osteopenia 08/03/2018  ? Closed intertrochanteric fracture of right hip status post nailing 07/26/2018  ? Premature atrial contraction 06/29/2018  ? Type 2 diabetes mellitus without complication, without long-term current use of insulin (HCC) 06/29/2018  ? Annual physical exam 08/19/2015  ? Primary osteoarthritis of right knee 08/19/2015  ? Renal insufficiency 08/15/2014  ? Panic disorder 05/23/2014  ? Major depressive disorder, recurrent severe without psychotic features (HCC) 08/06/2013  ? Hyperlipidemia 07/07/2012  ? Hypertension 07/05/2012  ? ? ?Val Riles, PT, MPH  ?12/29/2021, 4:08 PM ? ?[place] [facility] ?[address] ?[place], [place], [address] ?Phone: [phone]   Fax:  [phone] ? ?Name: Letzy Gullickson ?MRN: [id number] ?Date of Birth: 1945/11/27 ? ? ? ?

## 2021-12-29 NOTE — Patient Instructions (Signed)
Access Code: [email or web address] ?URL: [email or web address] ?Date: 12/29/2021 ?Prepared by: Gillermo Murdoch ? ?Exercises ?- Seated Cervical Retraction  - 2 x daily - 7 x weekly - 1-2 sets - 5-10 reps - 10 sec  hold ?- Standing Scapular Retraction  - 3 x daily - 7 x weekly - 1 sets - 10 reps - 10 sec  hold ?- Seated Elbow Flexion and Extension AROM  - 2 x daily - 7 x weekly - 1 sets - 10 reps - 2-3 sec  hold ?- Seated Forearm Pronation and Supination AROM  - 2 x daily - 7 x weekly - 1 sets - 10 reps - 2-3 sec  hold ?- Gripping Sponge Neutral  - 2-3 x daily - 7 x weekly - 1-2 min max hold ?- Circular Shoulder Pendulum with Table Support  - 3-4 x daily - 7 x weekly - 1 sets - 20-30 reps ?

## 2022-01-02 ENCOUNTER — Other Ambulatory Visit: Payer: Self-pay | Admitting: Sports Medicine

## 2022-01-02 DIAGNOSIS — E7800 Pure hypercholesterolemia, unspecified: Secondary | ICD-10-CM

## 2022-01-07 ENCOUNTER — Ambulatory Visit: Payer: Medicare (Managed Care) | Admitting: Rehabilitative and Restorative Service Providers"

## 2022-01-07 ENCOUNTER — Encounter: Payer: Self-pay | Admitting: Rehabilitative and Restorative Service Providers"

## 2022-01-07 ENCOUNTER — Telehealth: Payer: Self-pay

## 2022-01-07 DIAGNOSIS — R293 Abnormal posture: Secondary | ICD-10-CM

## 2022-01-07 DIAGNOSIS — M8580 Other specified disorders of bone density and structure, unspecified site: Secondary | ICD-10-CM

## 2022-01-07 DIAGNOSIS — R29898 Other symptoms and signs involving the musculoskeletal system: Secondary | ICD-10-CM

## 2022-01-07 DIAGNOSIS — M25511 Pain in right shoulder: Secondary | ICD-10-CM | POA: Diagnosis not present

## 2022-01-07 DIAGNOSIS — R601 Generalized edema: Secondary | ICD-10-CM

## 2022-01-07 DIAGNOSIS — M6281 Muscle weakness (generalized): Secondary | ICD-10-CM

## 2022-01-07 NOTE — Therapy (Signed)
[place] ?[facility] ?[address] [place] [address] ?[place], [place], [address] ?Phone: [phone]   Fax:  [phone] ? ?Physical Therapy Treatment ? ?Patient Details  ?Name: Kristy Hays ?MRN: [id number] ?Date of Birth: 27-Aug-1946 ?Referring Provider (PT): Dr Ramond Marrow ? ? ?Encounter Date: 01/07/2022 ? ? PT End of Session - 01/07/22 1057   ? ? Visit Number 3   ? Number of Visits 20   ? Date for PT Re-Evaluation 03/16/22   ? PT Start Time 1057   ? PT Stop Time 1145   ? PT Time Calculation (min) 48 min   ? Activity Tolerance Patient tolerated treatment well   ? ?  ?  ? ?  ? ? ?Past Medical History:  ?Diagnosis Date  ? Anxiety   ? Arthritis   ? Depression   ? Glaucoma   ? Hyperlipidemia   ? Hypertension   ? Proximal humerus fracture   ? right  ? ? ?Past Surgical History:  ?Procedure Laterality Date  ? ABDOMINAL HYSTERECTOMY    ? HIP FRACTURE SURGERY Right   ? REVERSE SHOULDER ARTHROPLASTY Right 12/11/2021  ? Procedure: REVERSE SHOULDER ARTHROPLASTY;  Surgeon: Bjorn Pippin, MD;  Location: [facility];  Service: Orthopedics;  Laterality: Right;  ? ? ?There were no vitals filed for this visit. ? ? Subjective Assessment - 01/07/22 1057   ? ? Subjective Patient reports that she has been doing well with no pain in the Rt shoulder. Reverse TSA 12/11/21 She is looking forward to getting out of the sling tomorrow.   ? Pertinent History Fx Rt hip with ORIF 2019   ? Currently in Pain? No/denies   ? Pain Score 0-No pain   ? Pain Location Shoulder   ? Pain Orientation Right   ? ?  ?  ? ?  ? ? ? ? ? [facility] PT Assessment - 01/07/22 0001   ? ?  ? Assessment  ? Medical Diagnosis Fracture/dislocation Rt shoulder   ? Referring Provider (PT) Dr Ramond Marrow   ? Onset Date/Surgical Date 12/11/21   fall with injury 10/27/21  ? Hand Dominance Right   ? Next MD Visit 01/08/22   ? Prior Therapy for fx hip   ?  ? PROM  ? Right Shoulder Flexion 120 Degrees   ? Right Shoulder ABduction 90 Degrees   in  scapular plane  ? Right Shoulder External Rotation 38 Degrees   in scapular plane  ?  ? Palpation  ? Palpation comment decreasing muscular tightness Rt pecs; upper trap; leveator; teres; deltoid; biceps   ? ?  ?  ? ?  ? ? ? ? ? ? ? ? ? ? ? ? ? ? ? ? [facility] Adult PT Treatment/Exercise - 01/07/22 0001   ? ?  ? Shoulder Exercises: Seated  ? Retraction Strengthening;Both;10 reps;Theraband   ? Retraction Limitations scap squeeze   ?  ? Shoulder Exercises: Standing  ? Other Standing Exercises scap squeeze 10 x 5 sec hold   ?  ? Shoulder Exercises: Pulleys  ? Flexion Limitations 10 sec hold x 10 reps   ?  ? Shoulder Exercises: ROM/Strengthening  ? Pendulum circle CW/CCW x 20 each   ?  ? Shoulder Exercises: Isometric Strengthening  ? Flexion 5X5"   ? External Rotation 5X5"   ? ABduction 5X5"   ?  ? Vasopneumatic  ? Number Minutes Vasopneumatic  10 minutes   ? Vasopnuematic Location  Shoulder   ? Vasopneumatic  Pressure Low   ? Vasopneumatic Temperature  34   ?  ? Manual Therapy  ? Manual Therapy --   pt supine  ? Soft tissue mobilization soft tissue mobilization to Rt shoulder girdle   ? Passive ROM PROM Rt shoulder in flexion; scaption; ER in scapular plane - all motions within tissue limits and pt tolerance   ? ?  ?  ? ?  ? ? ? ? ? ? ? ? ? ? PT Education - 01/07/22 1116   ? ? Education Details HEP   ? Person(s) Educated Patient   ? Methods Explanation;Demonstration;Tactile cues;Verbal cues;Handout   ? Comprehension Verbalized understanding;Returned demonstration;Verbal cues required;Tactile cues required   ? ?  ?  ? ?  ? ? ? PT Short Term Goals - 12/22/21 1245   ? ?  ? PT SHORT TERM GOAL #1  ? Title HEP per protocol   ? Time 6   ? Period Weeks   ? Status New   ? Target Date 02/02/22   ?  ? PT SHORT TERM GOAL #2  ? Title Increase PROM Rt shoulder per protocol   ? Time 6   ? Period Weeks   ? Status New   ? Target Date 02/02/22   ? ?  ?  ? ?  ? ? ? ? PT Long Term Goals - 12/22/21 1246   ? ?  ? PT LONG TERM GOAL #1  ? Title  Improve posture and alignment with patient to demonstrate improved upright posture with posterior shoudler girdle musculature engaged and shoulders level in sitting and standing; Rt UE at side normally   ? Time 12   ? Period Weeks   ? Status New   ? Target Date 03/16/22   ?  ? PT LONG TERM GOAL #2  ? Title Increase AROM Rt shoulder to WFL's allowing patient to perform all normal functional activities for ADL's and light household chores   ? Time 12   ? Period Weeks   ? Status New   ? Target Date 03/16/22   ?  ? PT LONG TERM GOAL #3  ? Title Increased functional strength Rt UE to 4/5 to 4+/5   ? Time 12   ? Status New   ? Target Date 03/16/22   ?  ? PT LONG TERM GOAL #4  ? Title Independent in HEP (including aquatic exercise program as indicated)   ? Time 12   ? Period Weeks   ? Status New   ? Target Date 03/16/22   ?  ? PT LONG TERM GOAL #5  ? Title Improve functional limitation score to 31   ? Time 12   ? Period Weeks   ? Status New   ? Target Date 03/16/22   ? ?  ?  ? ?  ? ? ? ? ? ? ? ? Plan - 01/07/22 1107   ? ? Clinical Impression Statement Patient continues to report no pain in the Rt shoulder. She is OK to wean from sling starting tomorrow. Added pulley and isometric exercises for home. Continued with manual work and PROM. Progressing well with shoulder rehab. PROM at goals for ROM at 4 weeks. Note to MD   ? Rehab Potential Good   ? PT Frequency 2x / week   ? PT Duration 12 weeks   ? PT Treatment/Interventions ADLs/Self Care Home Management;Aquatic Therapy;Cryotherapy;Electrical Stimulation;Iontophoresis 4mg /ml Dexamethasone;Moist Heat;Ultrasound;Therapeutic activities;Therapeutic exercise;Neuromuscular re-education;Patient/family education;Manual techniques;Passive range of motion;Dry needling;Taping;Vasopneumatic Device   ?  PT Next Visit Plan progress HEP per protocol, manual therapy, PROM, Rt shoulder stretch and strengthening per protocol - note to MD   ? PT Home Exercise Plan [email or web address]   ? Consulted and  Agree with Plan of Care Patient   ? ?  ?  ? ?  ? ? ?Patient will benefit from skilled therapeutic intervention in order to improve the following deficits and impairments:    ? ?Visit Diagnosis: ?Acute pain of right shoulder ? ?Other symptoms and signs involving the musculoskeletal system ? ?Abnormal posture ? ?Muscle weakness (generalized) ? ?Generalized edema ? ? ? ? ?Problem List ?Patient Active Problem List  ? Diagnosis Date Noted  ? 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 Closed fracture dislocation of shoulder, right, initial encounter 10/27/2021  ? Osteopenia 08/03/2018  ? Closed intertrochanteric fracture of right hip status post nailing 07/26/2018  ? Premature atrial contraction 06/29/2018  ? Type 2 diabetes mellitus without complication, without long-term current use of insulin (HCC) 06/29/2018  ? Annual physical exam 08/19/2015  ? Primary osteoarthritis of right knee 08/19/2015  ? Renal insufficiency 08/15/2014  ? Panic disorder 05/23/2014  ? Major depressive disorder, recurrent severe without psychotic features (HCC) 08/06/2013  ? Hyperlipidemia 07/07/2012  ? Hypertension 07/05/2012  ? ? ?Val Rileselyn P Curt Oatis, PT, MPH  ?01/07/2022, 11:43 AM ? ?[place] ?[facility] ?[address] ?[place], [place], [address] ?Phone: [phone]   Fax:  [phone] ? ?Name: Kristy Hays ?MRN: [id number] ?Date of Birth: Aug 25, 1946 ? ? ? ?

## 2022-01-07 NOTE — Patient Instructions (Signed)
Access Code: [email or web address] ?URL: [email or web address] ?Date: 01/07/2022 ?Prepared by: Gillermo Murdoch ? ?Exercises ?- Seated Cervical Retraction  - 2 x daily - 7 x weekly - 1-2 sets - 5-10 reps - 10 sec  hold ?- Standing Scapular Retraction  - 3 x daily - 7 x weekly - 1 sets - 10 reps - 10 sec  hold ?- Seated Elbow Flexion and Extension AROM  - 2 x daily - 7 x weekly - 1 sets - 10 reps - 2-3 sec  hold ?- Seated Forearm Pronation and Supination AROM  - 2 x daily - 7 x weekly - 1 sets - 10 reps - 2-3 sec  hold ?- Gripping Sponge Neutral  - 2-3 x daily - 7 x weekly - 1-2 min max hold ?- Circular Shoulder Pendulum with Table Support  - 3-4 x daily - 7 x weekly - 1 sets - 20-30 reps ?- Seated Shoulder Flexion AAROM with Pulley Behind  - 2 x daily - 7 x weekly - 1 sets - 10 reps - 10 sec  hold ?- Isometric Shoulder Abduction at Wall  - 2 x daily - 7 x weekly - 1 sets - 5-10 reps - 5 sec  hold ?- Isometric Shoulder External Rotation at Wall  - 2 x daily - 7 x weekly - 1 sets - 5 reps - 5 sec  hold ?- Isometric Shoulder External Rotation at Wall  - 2 x daily - 7 x weekly - 1 sets - 5 reps - 5 sec  hold ?- Isometric Shoulder Flexion at Wall  - 2 x daily - 7 x weekly - 1 sets - 5-10 reps - 5 sec  hold ?

## 2022-01-07 NOTE — Telephone Encounter (Signed)
-   DEXA scan ordered

## 2022-01-07 NOTE — Telephone Encounter (Signed)
Kristy Hays with [facility] called to stated that they require a DEXA scan within 6 months after a fracture to close the gap. Needs to be done by 05/09/22. Patient was notified by insurance company about this and said she wanted to go through her PCP.  ?

## 2022-01-08 DIAGNOSIS — S42201D Unspecified fracture of upper end of right humerus, subsequent encounter for fracture with routine healing: Secondary | ICD-10-CM | POA: Diagnosis not present

## 2022-01-08 NOTE — Telephone Encounter (Signed)
Left detailed msg on VM with number to imaging dept to schedule.  ?

## 2022-01-14 ENCOUNTER — Ambulatory Visit
Payer: Medicare (Managed Care) | Attending: Orthopaedic Surgery | Admitting: Rehabilitative and Restorative Service Providers"

## 2022-01-14 ENCOUNTER — Encounter: Payer: Self-pay | Admitting: Rehabilitative and Restorative Service Providers"

## 2022-01-14 DIAGNOSIS — R293 Abnormal posture: Secondary | ICD-10-CM | POA: Diagnosis not present

## 2022-01-14 DIAGNOSIS — R601 Generalized edema: Secondary | ICD-10-CM | POA: Diagnosis not present

## 2022-01-14 DIAGNOSIS — R29898 Other symptoms and signs involving the musculoskeletal system: Secondary | ICD-10-CM

## 2022-01-14 DIAGNOSIS — M6281 Muscle weakness (generalized): Secondary | ICD-10-CM | POA: Diagnosis not present

## 2022-01-14 DIAGNOSIS — M25511 Pain in right shoulder: Secondary | ICD-10-CM | POA: Diagnosis not present

## 2022-01-14 NOTE — Therapy (Signed)
[place] ?[facility] ?[address] ?[place], [place], [address] ?Phone: [phone]   Fax:  [phone] ? ?Physical Therapy Treatment ? ?Patient Details  ?Name: Kristy Hays ?MRN: [id number] ?Date of Birth: 04/09/1946 ?Referring Provider (PT): Dr Ophelia Charter ? ? ?Encounter Date: 01/14/2022 ? ? PT End of Session - 01/14/22 1054   ? ? Visit Number 4   ? Number of Visits 20   ? Date for PT Re-Evaluation 03/16/22   ? PT Start Time 1055   ? PT Stop Time 1145   ? PT Time Calculation (min) 50 min   ? Activity Tolerance Patient tolerated treatment well   ? ?  ?  ? ?  ? ? ?Past Medical History:  ?Diagnosis Date  ? Anxiety   ? Arthritis   ? Depression   ? Glaucoma   ? Hyperlipidemia   ? Hypertension   ? Proximal humerus fracture   ? right  ? ? ?Past Surgical History:  ?Procedure Laterality Date  ? ABDOMINAL HYSTERECTOMY    ? HIP FRACTURE SURGERY Right   ? REVERSE SHOULDER ARTHROPLASTY Right 12/11/2021  ? Procedure: REVERSE SHOULDER ARTHROPLASTY;  Surgeon: Hiram Gash, MD;  Location: [facility];  Service: Orthopedics;  Laterality: Right;  ? ? ?There were no vitals filed for this visit. ? ? Subjective Assessment - 01/14/22 1055   ? ? Subjective Glad to be put pf the sling. Doing well with exercises. No pain in the Rt shoulder   ? Currently in Pain? No/denies   ? Pain Score 0-No pain   ? ?  ?  ? ?  ? ? ? ? ? [facility] PT Assessment - 01/14/22 0001   ? ?  ? Assessment  ? Medical Diagnosis Fracture/dislocation Rt shoulder   ? Referring Provider (PT) Dr Ophelia Charter   ? Onset Date/Surgical Date 12/11/21   fall with injury 10/27/21  ? Hand Dominance Right   ? Next MD Visit 01/08/22   ? Prior Therapy for fx hip   ?  ? PROM  ? Right Shoulder Flexion 132 Degrees   ? Right Shoulder ABduction 108 Degrees   in scapular plane  ? Right Shoulder External Rotation 61 Degrees   in scapular plane  ? ?  ?  ? ?  ? ? ? ? ? ? ? ? ? ? ? ? ? ? ? ? [facility] Adult PT Treatment/Exercise - 01/14/22 0001    ? ?  ? Shoulder Exercises: Standing  ? External Rotation Strengthening;Right;10 reps;Theraband   ? Theraband Level (Shoulder External Rotation) Level 2 (Red)   ? External Rotation Limitations reactive isometric   ? Internal Rotation Strengthening;Right;10 reps;Theraband   ? Theraband Level (Shoulder Internal Rotation) Level 2 (Red)   ? Internal Rotation Limitations reactive isometric   ? Flexion 10 reps;AROM;AAROM;Strengthening;5 reps   ? Flexion Limitations sliding up wall in flexion with towel   ? Row Strengthening;Both;20 reps;Theraband   ? Theraband Level (Shoulder Row) Level 3 (Green)   ? Other Standing Exercises scap squeeze 10 x 5 sec hold   ?  ? Shoulder Exercises: Pulleys  ? Flexion Limitations 10 sec hold x 10 reps   ? Scaption Limitations 10 sec hold x10 reps   ? Other Pulley Exercises holding shoulder in some flexion and abduction for opening and closing - horizontal ab/add x 10 reps   ?  ? Shoulder Exercises: Isometric Strengthening  ? Flexion 5X5"   ? External Rotation 5X5"   ?  ABduction 5X5"   ?  ? Shoulder Exercises: Stretch  ? Other Shoulder Stretches T stretch in supine   ?  ? Vasopneumatic  ? Number Minutes Vasopneumatic  10 minutes   ? Vasopnuematic Location  Shoulder   ? Vasopneumatic Pressure Low   ? Vasopneumatic Temperature  34   ?  ? Manual Therapy  ? Manual Therapy --   pt supine  ? Soft tissue mobilization soft tissue mobilization to Rt shoulder girdle   ? Passive ROM PROM Rt shoulder in flexion; scaption; ER in scapular plane - all motions within tissue limits and pt tolerance   ? ?  ?  ? ?  ? ? ? ? ? ? ? ? ? ? ? ? PT Short Term Goals - 12/22/21 1245   ? ?  ? PT SHORT TERM GOAL #1  ? Title HEP per protocol   ? Time 6   ? Period Weeks   ? Status New   ? Target Date 02/02/22   ?  ? PT SHORT TERM GOAL #2  ? Title Increase PROM Rt shoulder per protocol   ? Time 6   ? Period Weeks   ? Status New   ? Target Date 02/02/22   ? ?  ?  ? ?  ? ? ? ? PT Long Term Goals - 12/22/21 1246   ? ?  ? PT  LONG TERM GOAL #1  ? Title Improve posture and alignment with patient to demonstrate improved upright posture with posterior shoudler girdle musculature engaged and shoulders level in sitting and standing; Rt UE at side normally   ? Time 12   ? Period Weeks   ? Status New   ? Target Date 03/16/22   ?  ? PT LONG TERM GOAL #2  ? Title Increase AROM Rt shoulder to WFL's allowing patient to perform all normal functional activities for ADL's and light household chores   ? Time 12   ? Period Weeks   ? Status New   ? Target Date 03/16/22   ?  ? PT LONG TERM GOAL #3  ? Title Increased functional strength Rt UE to 4/5 to 4+/5   ? Time 12   ? Status New   ? Target Date 03/16/22   ?  ? PT LONG TERM GOAL #4  ? Title Independent in HEP (including aquatic exercise program as indicated)   ? Time 12   ? Period Weeks   ? Status New   ? Target Date 03/16/22   ?  ? PT LONG TERM GOAL #5  ? Title Improve functional limitation score to 31   ? Time 12   ? Period Weeks   ? Status New   ? Target Date 03/16/22   ? ?  ?  ? ?  ? ? ? ? ? ? ? ? Plan - 01/14/22 1058   ? ? Clinical Impression Statement Progressing well with shoulder rehab. Progressing with exercise per protocol. Good gains in PROM noted today. Added light resistive exercises with TB and issued TB for home.   ? Rehab Potential Good   ? PT Frequency 2x / week   ? PT Duration 12 weeks   ? PT Treatment/Interventions ADLs/Self Care Home Management;Aquatic Therapy;Cryotherapy;Electrical Stimulation;Iontophoresis 4mg /ml Dexamethasone;Moist Heat;Ultrasound;Therapeutic activities;Therapeutic exercise;Neuromuscular re-education;Patient/family education;Manual techniques;Passive range of motion;Dry needling;Taping;Vasopneumatic Device   ? PT Next Visit Plan progress HEP per protocol, manual therapy, PROM, Rt shoulder stretch and strengthening per protocol - note to MD   ?  PT Home Exercise Plan [email or web address]   ? Consulted and Agree with Plan of Care Patient   ? ?  ?  ? ?  ? ? ?Patient will benefit  from skilled therapeutic intervention in order to improve the following deficits and impairments:    ? ?Visit Diagnosis: ?Acute pain of right shoulder ? ?Other symptoms and signs involving the musculoskeletal system ? ?Abnormal posture ? ?Muscle weakness (generalized) ? ?Generalized edema ? ? ? ? ?Problem List ?Patient Active Problem List  ? Diagnosis Date Noted  ? 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 Closed fracture dislocation of shoulder, right, initial encounter 10/27/2021  ? Osteopenia 08/03/2018  ? Closed intertrochanteric fracture of right hip status post nailing 07/26/2018  ? Premature atrial contraction 06/29/2018  ? Type 2 diabetes mellitus without complication, without long-term current use of insulin ([place]) 06/29/2018  ? Annual physical exam 08/19/2015  ? Primary osteoarthritis of right knee 08/19/2015  ? Renal insufficiency 08/15/2014  ? Panic disorder 05/23/2014  ? Major depressive disorder, recurrent severe without psychotic features ([place]) 08/06/2013  ? Hyperlipidemia 07/07/2012  ? Hypertension 07/05/2012  ? ? ?Everardo All, PT, MPH  ?01/14/2022, 11:44 AM ? ?[place] ?[facility] ?[address] ?[place], [place], [address] ?Phone: [phone]   Fax:  [phone] ? ?Name: Kristy Hays ?MRN: [id number] ?Date of Birth: [birth year] ? ? ? ?

## 2022-01-19 ENCOUNTER — Ambulatory Visit: Payer: Medicare (Managed Care) | Admitting: Rehabilitative and Restorative Service Providers"

## 2022-01-19 ENCOUNTER — Encounter: Payer: Self-pay | Admitting: Rehabilitative and Restorative Service Providers"

## 2022-01-19 DIAGNOSIS — M6281 Muscle weakness (generalized): Secondary | ICD-10-CM

## 2022-01-19 DIAGNOSIS — R293 Abnormal posture: Secondary | ICD-10-CM

## 2022-01-19 DIAGNOSIS — M25511 Pain in right shoulder: Secondary | ICD-10-CM

## 2022-01-19 DIAGNOSIS — R29898 Other symptoms and signs involving the musculoskeletal system: Secondary | ICD-10-CM

## 2022-01-19 DIAGNOSIS — R601 Generalized edema: Secondary | ICD-10-CM

## 2022-01-19 NOTE — Therapy (Signed)
[place] ?[facility] ?[address] ?[place], [place], [address] ?Phone: [phone]   Fax:  [phone] ? ?Physical Therapy Treatment ? ?Patient Details  ?Name: Kristy Hays ?MRN: [id number] ?Date of Birth: 11/03/1945 ?Referring Provider (PT): Dr Ramond Marrow ? ? ?Encounter Date: 01/19/2022 ? ? PT End of Session - 01/19/22 1157   ? ? Visit Number 5   ? Number of Visits 20   ? Date for PT Re-Evaluation 03/16/22   ? PT Start Time 1145   ? PT Stop Time 1233   ? PT Time Calculation (min) 48 min   ? Activity Tolerance Patient tolerated treatment well   ? ?  ?  ? ?  ? ? ?Past Medical History:  ?Diagnosis Date  ? Anxiety   ? Arthritis   ? Depression   ? Glaucoma   ? Hyperlipidemia   ? Hypertension   ? Proximal humerus fracture   ? right  ? ? ?Past Surgical History:  ?Procedure Laterality Date  ? ABDOMINAL HYSTERECTOMY    ? HIP FRACTURE SURGERY Right   ? REVERSE SHOULDER ARTHROPLASTY Right 12/11/2021  ? Procedure: REVERSE SHOULDER ARTHROPLASTY;  Surgeon: Bjorn Pippin, MD;  Location: [facility];  Service: Orthopedics;  Laterality: Right;  ? ? ?There were no vitals filed for this visit. ? ? Subjective Assessment - 01/19/22 1158   ? ? Subjective Doiing well. Working on exercises. No pain   ? Currently in Pain? No/denies   ? Pain Score 0-No pain   ? ?  ?  ? ?  ? ? ? ? ? [facility] PT Assessment - 01/19/22 0001   ? ?  ? Assessment  ? Medical Diagnosis Fracture/dislocation Rt shoulder   ? Referring Provider (PT) Dr Ramond Marrow   ? Onset Date/Surgical Date 12/11/21   fall with injury 10/27/21  ? Hand Dominance Right   ? Next MD Visit 6/23   ? Prior Therapy for fx hip   ?  ? Palpation  ? Palpation comment decreasing muscular tightness Rt pecs; upper trap; leveator; teres; deltoid; biceps   ? ?  ?  ? ?  ? ? ? ? ? ? ? ? ? ? ? ? ? ? ? ? [facility] Adult PT Treatment/Exercise - 01/19/22 0001   ? ?  ? Shoulder Exercises: Supine  ? Other Supine Exercises stabilization shoudler at 90 deg flexion  small range flex/ext; horizontal ab/ad; circles CW/CCW x 20 each   ?  ? Shoulder Exercises: Sidelying  ? External Rotation AROM;Strengthening;Right;20 reps;Theraband   ?  ? Shoulder Exercises: Standing  ? External Rotation Strengthening;Right;10 reps;Theraband   ? Theraband Level (Shoulder External Rotation) Level 2 (Red)   ? External Rotation Limitations reactive isometric   ? Internal Rotation Strengthening;Right;10 reps;Theraband   ? Theraband Level (Shoulder Internal Rotation) Level 2 (Red)   ? Internal Rotation Limitations reactive isometric   ? Flexion 10 reps;AROM;AAROM;Strengthening;5 reps   ? Theraband Level (Shoulder Flexion) Other (comment)   ? Flexion Limitations sliding up wall in flexion with towel x 10 reps   ? Row Strengthening;Both;10 reps;Theraband   ? Theraband Level (Shoulder Row) Level 3 (Green)   ? Other Standing Exercises scap squeeze 10 x 5 sec hold   ? Other Standing Exercises shoulder flexion bilat x 10 x 2 set; scaption x 10 x 2 sets focus on scapular control noodle along spine   ?  ? Shoulder Exercises: Pulleys  ? Flexion Limitations 10 sec hold x 10  reps   ? Scaption Limitations 10 sec hold x10 reps   ? Other Pulley Exercises holding shoulder in some flexion and abduction for opening and closing - horizontal ab/add x 10 reps   ?  ? Shoulder Exercises: Stretch  ? Table Stretch - Flexion 5 reps;10 seconds   ?  ? Cryotherapy  ? Number Minutes Cryotherapy 10 Minutes   ? Cryotherapy Location Shoulder   ? Type of Cryotherapy Ice pack   ? ?  ?  ? ?  ? ? ? ? ? ? ? ? ? ? PT Education - 01/19/22 1222   ? ? Education Details HEP   ? Person(s) Educated Patient   ? Methods Explanation;Demonstration;Tactile cues;Verbal cues;Handout   ? Comprehension Verbalized understanding;Returned demonstration;Verbal cues required;Tactile cues required   ? ?  ?  ? ?  ? ? ? PT Short Term Goals - 12/22/21 1245   ? ?  ? PT SHORT TERM GOAL #1  ? Title HEP per protocol   ? Time 6   ? Period Weeks   ? Status New   ?  Target Date 02/02/22   ?  ? PT SHORT TERM GOAL #2  ? Title Increase PROM Rt shoulder per protocol   ? Time 6   ? Period Weeks   ? Status New   ? Target Date 02/02/22   ? ?  ?  ? ?  ? ? ? ? PT Long Term Goals - 12/22/21 1246   ? ?  ? PT LONG TERM GOAL #1  ? Title Improve posture and alignment with patient to demonstrate improved upright posture with posterior shoudler girdle musculature engaged and shoulders level in sitting and standing; Rt UE at side normally   ? Time 12   ? Period Weeks   ? Status New   ? Target Date 03/16/22   ?  ? PT LONG TERM GOAL #2  ? Title Increase AROM Rt shoulder to WFL's allowing patient to perform all normal functional activities for ADL's and light household chores   ? Time 12   ? Period Weeks   ? Status New   ? Target Date 03/16/22   ?  ? PT LONG TERM GOAL #3  ? Title Increased functional strength Rt UE to 4/5 to 4+/5   ? Time 12   ? Status New   ? Target Date 03/16/22   ?  ? PT LONG TERM GOAL #4  ? Title Independent in HEP (including aquatic exercise program as indicated)   ? Time 12   ? Period Weeks   ? Status New   ? Target Date 03/16/22   ?  ? PT LONG TERM GOAL #5  ? Title Improve functional limitation score to 31   ? Time 12   ? Period Weeks   ? Status New   ? Target Date 03/16/22   ? ?  ?  ? ?  ? ? ? ? ? ? ? ? Plan - 01/19/22 1215   ? ? Clinical Impression Statement Reviewed, corrected and progressed with Rt shoulder exercises/rehab. Continued with manual work and PROM/stretching   ? Rehab Potential Good   ? PT Frequency 2x / week   ? PT Duration 12 weeks   ? PT Treatment/Interventions ADLs/Self Care Home Management;Aquatic Therapy;Cryotherapy;Electrical Stimulation;Iontophoresis 4mg /ml Dexamethasone;Moist Heat;Ultrasound;Therapeutic activities;Therapeutic exercise;Neuromuscular re-education;Patient/family education;Manual techniques;Passive range of motion;Dry needling;Taping;Vasopneumatic Device   ? PT Next Visit Plan progress HEP per protocol, manual therapy, PROM, Rt shoulder  stretch and  strengthening per protocol   ? PT Home Exercise Plan [email or web address]   ? ?  ?  ? ?  ? ? ?Patient will benefit from skilled therapeutic intervention in order to improve the following deficits and impairments:    ? ?Visit Diagnosis: ?Acute pain of right shoulder ? ?Other symptoms and signs involving the musculoskeletal system ? ?Abnormal posture ? ?Muscle weakness (generalized) ? ?Generalized edema ? ? ? ? ?Problem List ?Patient Active Problem List  ? Diagnosis Date Noted  ? 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 Closed fracture dislocation of shoulder, right, initial encounter 10/27/2021  ? Osteopenia 08/03/2018  ? Closed intertrochanteric fracture of right hip status post nailing 07/26/2018  ? Premature atrial contraction 06/29/2018  ? Type 2 diabetes mellitus without complication, without long-term current use of insulin (HCC) 06/29/2018  ? Annual physical exam 08/19/2015  ? Primary osteoarthritis of right knee 08/19/2015  ? Renal insufficiency 08/15/2014  ? Panic disorder 05/23/2014  ? Major depressive disorder, recurrent severe without psychotic features (HCC) 08/06/2013  ? Hyperlipidemia 07/07/2012  ? Hypertension 07/05/2012  ? ? ?Kristy Hays, PT, MPH  ?01/19/2022, 12:42 PM ? ?[place] ?[facility] ?[address] ?[place], [place], [address] ?Phone: [phone]   Fax:  [phone] ? ?Name: Kristy RaringGay Hays ?MRN: [id number] ?Date of Birth: 03/11/1946 ? ? ? ?

## 2022-01-19 NOTE — Patient Instructions (Signed)
Access Code: [email or web address] ?URL: [email or web address] ?Date: 01/19/2022 ?Prepared by: Corlis Leak ? ?Exercises ?- Seated Cervical Retraction  - 2 x daily - 7 x weekly - 1-2 sets - 5-10 reps - 10 sec  hold ?- Standing Scapular Retraction  - 3 x daily - 7 x weekly - 1 sets - 10 reps - 10 sec  hold ?- Seated Elbow Flexion and Extension AROM  - 2 x daily - 7 x weekly - 1 sets - 10 reps - 2-3 sec  hold ?- Seated Forearm Pronation and Supination AROM  - 2 x daily - 7 x weekly - 1 sets - 10 reps - 2-3 sec  hold ?- Gripping Sponge Neutral  - 2-3 x daily - 7 x weekly - 1-2 min max hold ?- Circular Shoulder Pendulum with Table Support  - 3-4 x daily - 7 x weekly - 1 sets - 20-30 reps ?- Seated Shoulder Flexion AAROM with Pulley Behind  - 2 x daily - 7 x weekly - 1 sets - 10 reps - 10 sec  hold ?- Isometric Shoulder Abduction at Wall  - 2 x daily - 7 x weekly - 1 sets - 5-10 reps - 5 sec  hold ?- Isometric Shoulder External Rotation at Wall  - 2 x daily - 7 x weekly - 1 sets - 5 reps - 5 sec  hold ?- Isometric Shoulder External Rotation at Wall  - 2 x daily - 7 x weekly - 1 sets - 5 reps - 5 sec  hold ?- Isometric Shoulder Flexion at Wall  - 2 x daily - 7 x weekly - 1 sets - 5-10 reps - 5 sec  hold ?- Standing Bilateral Low Shoulder Row with Anchored Resistance  - 1 x daily - 7 x weekly - 1-3 sets - 10 reps - 2-3 sec  hold ?- Shoulder External Rotation Reactive Isometrics  - 1 x daily - 7 x weekly - 1 sets - 10 reps - 3 sec  hold ?- Shoulder Internal Rotation Reactive Isometrics  - 1 x daily - 7 x weekly - 1 sets - 10 reps - 3-5 sec  hold ?- Standing Shoulder and Trunk Flexion at Table  - 2 x daily - 7 x weekly - 1 sets - 5 reps - 5-10 sec  hold ?- Standing Shoulder Flexion Wall Walk  - 2 x daily - 7 x weekly - 1 sets - 3 reps - 30 sec  hold ?- Wall Push Up  - 1 x daily - 7 x weekly - 1-3 sets - 10 reps - 3 sec  hold ?- Standing Shoulder Flexion to 90 Degrees  - 1 x daily - 7 x weekly - 2 sets - 10 reps - 2-3 sec   hold ?- Standing Shoulder Scaption  - 1 x daily - 7 x weekly - 2 sets - 10 reps - 3 sec  hold ?- Sidelying Shoulder External Rotation  - 1 x daily - 7 x weekly - 2 sets - 10 reps - 3 sec  hold ?

## 2022-01-26 ENCOUNTER — Ambulatory Visit: Payer: Medicare (Managed Care) | Admitting: Rehabilitative and Restorative Service Providers"

## 2022-01-26 ENCOUNTER — Encounter: Payer: Self-pay | Admitting: Rehabilitative and Restorative Service Providers"

## 2022-01-26 DIAGNOSIS — R29898 Other symptoms and signs involving the musculoskeletal system: Secondary | ICD-10-CM

## 2022-01-26 DIAGNOSIS — R293 Abnormal posture: Secondary | ICD-10-CM

## 2022-01-26 DIAGNOSIS — M6281 Muscle weakness (generalized): Secondary | ICD-10-CM

## 2022-01-26 DIAGNOSIS — M25511 Pain in right shoulder: Secondary | ICD-10-CM

## 2022-01-26 DIAGNOSIS — R601 Generalized edema: Secondary | ICD-10-CM

## 2022-01-26 NOTE — Therapy (Signed)
[place] [facility] ?[address] ?[place], [place], [address] ?Phone: [phone]   Fax:  [phone] ? ?Physical Therapy Treatment ? ?Patient Details  ?Name: Kristy RaringGay Mance ?MRN: [id number] ?Date of Birth: 07-27-1946 ?Referring Provider (PT): Dr Ramond Marrowax Varkey ? ? ?Encounter Date: 01/26/2022 ? ? PT End of Session - 01/26/22 1457   ? ? Visit Number 6   ? Number of Visits 20   ? Date for PT Re-Evaluation 03/16/22   ? PT Start Time 1445   ? PT Stop Time 1535   ? PT Time Calculation (min) 50 min   ? Activity Tolerance Patient tolerated treatment well   ? ?  ?  ? ?  ? ? ?Past Medical History:  ?Diagnosis Date  ? Anxiety   ? Arthritis   ? Depression   ? Glaucoma   ? Hyperlipidemia   ? Hypertension   ? Proximal humerus fracture   ? right  ? ? ?Past Surgical History:  ?Procedure Laterality Date  ? ABDOMINAL HYSTERECTOMY    ? HIP FRACTURE SURGERY Right   ? REVERSE SHOULDER ARTHROPLASTY Right 12/11/2021  ? Procedure: REVERSE SHOULDER ARTHROPLASTY;  Surgeon: Bjorn PippinVarkey, Dax T, MD;  Location: [facility];  Service: Orthopedics;  Laterality: Right;  ? ? ?There were no vitals filed for this visit. ? ? Subjective Assessment - 01/26/22 1501   ? ? Subjective Doiing well. Working on exercises. No pain. Got to see her dog this past weekend and the dog stayed with her some in her home and did well   ? Currently in Pain? No/denies   ? Pain Score 0-No pain   ? ?  ?  ? ?  ? ? ? ? ? [facility] PT Assessment - 01/26/22 0001   ? ?  ? Assessment  ? Medical Diagnosis Fracture/dislocation Rt shoulder   ? Referring Provider (PT) Dr Ramond Marrowax Varkey   ? Onset Date/Surgical Date 12/11/21   fall with injury 10/27/21  ? Hand Dominance Right   ? Next MD Visit 6/23   ? Prior Therapy for fx hip   ?  ? AROM  ? Right Shoulder Flexion 80 Degrees   some elevation of Rt shoulder girdle  ? Right Shoulder ABduction 62 Degrees   some elevation of shoulder girdle  ? ?  ?  ? ?  ? ? ? ? ? ? ? ? ? ? ? ? ? ? ? ? [facility] Adult PT  Treatment/Exercise - 01/26/22 0001   ? ?  ? Shoulder Exercises: Supine  ? Protraction Strengthening;Right;15 reps;Weights   ? Protraction Weight (lbs) 1   ? Flexion AROM;Strengthening;Right;15 reps   ? Shoulder Flexion Weight (lbs) 1   ? Other Supine Exercises stabilization shoudler at 90 deg flexion small range flex/ext; horizontal ab/ad; circles CW/CCW x 20 each   ?  ? Shoulder Exercises: Sidelying  ? External Rotation AROM;Strengthening;Right;20 reps;Theraband   ? External Rotation Weight (lbs) 1   ? ABduction Strengthening;Right;15 reps   ? ABduction Limitations with elbow bent and repeated with elbow straight   ?  ? Shoulder Exercises: Standing  ? External Rotation Strengthening;Right;10 reps;Theraband   ? Theraband Level (Shoulder External Rotation) Level 3 (Green)   ? External Rotation Limitations reactive isometric   ? Internal Rotation Strengthening;Right;10 reps;Theraband   ? Theraband Level (Shoulder Internal Rotation) Level 3 (Green)   ? Internal Rotation Limitations reactive isometric   ? Extension Strengthening;Both;20 reps;Theraband   ? Theraband Level (Shoulder Extension) Level 3 (Green)   ?  Row Strengthening;Both;10 reps;Theraband   ? Theraband Level (Shoulder Row) Level 3 (Green)   ? Other Standing Exercises scap squeeze 10 x 5 sec hold   ? Other Standing Exercises shoulder flexion bilat x 10 x 2 set; scaption x 10 x 2 sets focus on scapular control noodle along spine   ?  ? Shoulder Exercises: Pulleys  ? Flexion Limitations 10 sec hold x 10 reps   ? Scaption Limitations 10 sec hold x10 reps   ? Other Pulley Exercises holding shoulder in some flexion and abduction for opening and closing - horizontal ab/add x 10 reps   ?  ? Shoulder Exercises: ROM/Strengthening  ? Wall Pushups 20 reps   ? Other ROM/Strengthening Exercises shoulder flexion on wall hands in pillow case slide up x 5; press and hold varying heights x 5; press and lift hand off wall varying heights x 3 reps   ?  ? Vasopneumatic  ?  Number Minutes Vasopneumatic  10 minutes   ? Vasopnuematic Location  Shoulder   ? Vasopneumatic Pressure Low   ? Vasopneumatic Temperature  34   ? ?  ?  ? ?  ? ? ? ? ? ? ? ? ? ? ? ? PT Short Term Goals - 12/22/21 1245   ? ?  ? PT SHORT TERM GOAL #1  ? Title HEP per protocol   ? Time 6   ? Period Weeks   ? Status New   ? Target Date 02/02/22   ?  ? PT SHORT TERM GOAL #2  ? Title Increase PROM Rt shoulder per protocol   ? Time 6   ? Period Weeks   ? Status New   ? Target Date 02/02/22   ? ?  ?  ? ?  ? ? ? ? PT Long Term Goals - 12/22/21 1246   ? ?  ? PT LONG TERM GOAL #1  ? Title Improve posture and alignment with patient to demonstrate improved upright posture with posterior shoudler girdle musculature engaged and shoulders level in sitting and standing; Rt UE at side normally   ? Time 12   ? Period Weeks   ? Status New   ? Target Date 03/16/22   ?  ? PT LONG TERM GOAL #2  ? Title Increase AROM Rt shoulder to WFL's allowing patient to perform all normal functional activities for ADL's and light household chores   ? Time 12   ? Period Weeks   ? Status New   ? Target Date 03/16/22   ?  ? PT LONG TERM GOAL #3  ? Title Increased functional strength Rt UE to 4/5 to 4+/5   ? Time 12   ? Status New   ? Target Date 03/16/22   ?  ? PT LONG TERM GOAL #4  ? Title Independent in HEP (including aquatic exercise program as indicated)   ? Time 12   ? Period Weeks   ? Status New   ? Target Date 03/16/22   ?  ? PT LONG TERM GOAL #5  ? Title Improve functional limitation score to 31   ? Time 12   ? Period Weeks   ? Status New   ? Target Date 03/16/22   ? ?  ?  ? ?  ? ? ? ? ? ? ? ? Plan - 01/26/22 1518   ? ? Clinical Impression Statement Continued with progression of exercises focus on scapular control with elevation of Rt UE; strengthening;  ROM. Vaso post exercise to prevent soreness and pain   ? Rehab Potential Good   ? PT Frequency 2x / week   ? PT Duration 12 weeks   ? PT Treatment/Interventions ADLs/Self Care Home  Management;Aquatic Therapy;Cryotherapy;Electrical Stimulation;Iontophoresis 4mg /ml Dexamethasone;Moist Heat;Ultrasound;Therapeutic activities;Therapeutic exercise;Neuromuscular re-education;Patient/family education;Manual techniques;Passive range of motion;Dry needling;Taping;Vasopneumatic Device   ? PT Next Visit Plan progress HEP per protocol, manual therapy, PROM, Rt shoulder stretch and strengthening per protocol   ? PT Home Exercise Plan [email or web address]   ? Consulted and Agree with Plan of Care Patient   ? ?  ?  ? ?  ? ? ?Patient will benefit from skilled therapeutic intervention in order to improve the following deficits and impairments:    ? ?Visit Diagnosis: ?Acute pain of right shoulder ? ?Other symptoms and signs involving the musculoskeletal system ? ?Abnormal posture ? ?Muscle weakness (generalized) ? ?Generalized edema ? ? ? ? ?Problem List ?Patient Active Problem List  ? Diagnosis Date Noted  ? 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 Closed fracture dislocation of shoulder, right, initial encounter 10/27/2021  ? Osteopenia 08/03/2018  ? Closed intertrochanteric fracture of right hip status post nailing 07/26/2018  ? Premature atrial contraction 06/29/2018  ? Type 2 diabetes mellitus without complication, without long-term current use of insulin (HCC) 06/29/2018  ? Annual physical exam 08/19/2015  ? Primary osteoarthritis of right knee 08/19/2015  ? Renal insufficiency 08/15/2014  ? Panic disorder 05/23/2014  ? Major depressive disorder, recurrent severe without psychotic features (HCC) 08/06/2013  ? Hyperlipidemia 07/07/2012  ? Hypertension 07/05/2012  ? ? ?07/07/2012, PT, MPH  ?01/26/2022, 4:27 PM ? ?[place] [facility] ?[address] ?[place], [place], [place] ?Phone: [phone]   Fax:  [phone] ? ?Name: Alaiah Lundy ?MRN: Kristy Hays ?Date of Birth: [birth year] ? ? ? ?

## 2022-01-27 DIAGNOSIS — H401132 Primary open-angle glaucoma, bilateral, moderate stage: Secondary | ICD-10-CM | POA: Diagnosis not present

## 2022-02-02 ENCOUNTER — Ambulatory Visit: Payer: Medicare (Managed Care) | Admitting: Rehabilitative and Restorative Service Providers"

## 2022-02-02 ENCOUNTER — Encounter: Payer: Self-pay | Admitting: Rehabilitative and Restorative Service Providers"

## 2022-02-02 DIAGNOSIS — R601 Generalized edema: Secondary | ICD-10-CM

## 2022-02-02 DIAGNOSIS — R293 Abnormal posture: Secondary | ICD-10-CM

## 2022-02-02 DIAGNOSIS — M25511 Pain in right shoulder: Secondary | ICD-10-CM

## 2022-02-02 DIAGNOSIS — R29898 Other symptoms and signs involving the musculoskeletal system: Secondary | ICD-10-CM

## 2022-02-02 DIAGNOSIS — M6281 Muscle weakness (generalized): Secondary | ICD-10-CM

## 2022-02-02 NOTE — Patient Instructions (Signed)
Access Code: [email or web address] URL: [email or web address] Date: 02/02/2022 Prepared by: Corlis Leak  Exercises - Seated Cervical Retraction  - 2 x daily - 7 x weekly - 1-2 sets - 5-10 reps - 10 sec  hold - Seated Cervical Sidebending AROM  - 2 x daily - 7 x weekly - 1 sets - 5 reps - 5-10 sec  hold - Seated Cervical Rotation AROM  - 2 x daily - 7 x weekly - 1 sets - 5 reps - 2-3 sec  hold - Standing Scapular Retraction  - 3 x daily - 7 x weekly - 1 sets - 10 reps - 10 sec  hold - Seated Shoulder Flexion AAROM with Pulley Behind  - 2 x daily - 7 x weekly - 1 sets - 10 reps - 10 sec  hold - Isometric Shoulder Abduction at Wall  - 2 x daily - 7 x weekly - 1 sets - 5-10 reps - 5 sec  hold - Isometric Shoulder External Rotation at Wall  - 2 x daily - 7 x weekly - 1 sets - 5 reps - 5 sec  hold - Isometric Shoulder Flexion at Wall  - 2 x daily - 7 x weekly - 1 sets - 5-10 reps - 5 sec  hold - Standing Bilateral Low Shoulder Row with Anchored Resistance  - 1 x daily - 7 x weekly - 1-3 sets - 10 reps - 2-3 sec  hold - Shoulder External Rotation Reactive Isometrics  - 1 x daily - 7 x weekly - 1 sets - 10 reps - 3 sec  hold - Shoulder Internal Rotation Reactive Isometrics  - 1 x daily - 7 x weekly - 1 sets - 10 reps - 3-5 sec  hold - Standing Shoulder and Trunk Flexion at Table  - 2 x daily - 7 x weekly - 1 sets - 5 reps - 5-10 sec  hold - Standing Shoulder Flexion Wall Walk  - 2 x daily - 7 x weekly - 1 sets - 3 reps - 30 sec  hold - Wall Push Up  - 1 x daily - 7 x weekly - 1-3 sets - 10 reps - 3 sec  hold - Standing Shoulder Flexion to 90 Degrees  - 1 x daily - 7 x weekly - 2 sets - 10 reps - 2-3 sec  hold - Standing Shoulder Scaption  - 1 x daily - 7 x weekly - 2 sets - 10 reps - 3 sec  hold - Sidelying Shoulder External Rotation  - 1 x daily - 7 x weekly - 2 sets - 10 reps - 3 sec  hold

## 2022-02-02 NOTE — Therapy (Signed)
[facility] [address] [place], [place], [address] Phone: [phone]   Fax:  [phone]  Physical Therapy Treatment Rationale for Evaluation and Treatment Rehabilitation  Patient Details  Name: Kristy RaringGay Hays MRN: [id number] Date of Birth: 12/16/1945 Referring Provider (PT): Dr Ramond Marrowax Varkey   Encounter Date: 02/02/2022   PT End of Session - 02/02/22 1408     Visit Number 7    Number of Visits 20    Date for PT Re-Evaluation 03/16/22    PT Start Time 1400    PT Stop Time 1445    PT Time Calculation (min) 45 min    Activity Tolerance Patient tolerated treatment well             Past Medical History:  Diagnosis Date   Anxiety    Arthritis    Depression    Glaucoma    Hyperlipidemia    Hypertension    Proximal humerus fracture    right    Past Surgical History:  Procedure Laterality Date   ABDOMINAL HYSTERECTOMY     HIP FRACTURE SURGERY Right    REVERSE SHOULDER ARTHROPLASTY Right 12/11/2021   Procedure: REVERSE SHOULDER ARTHROPLASTY;  Surgeon: Bjorn PippinVarkey, Dax T, MD;  Location: [facility];  Service: Orthopedics;  Laterality: Right;    There were no vitals filed for this visit.   Subjective Assessment - 02/02/22 1430     Subjective Some soreness in the past day or so. Not sure if she is overdoing the exercises or her activities at home. She has her dog back with her and is now walking and caring for him. Will moniter her activity level and try to avoid overdoing it with exercises and ADL's    Currently in Pain? Yes    Pain Score 1     Pain Location Shoulder    Pain Orientation Right    Pain Descriptors / Indicators Sore    Pain Type Surgical pain                [facility] PT Assessment - 02/02/22 0001       Assessment   Medical Diagnosis Fracture/dislocation Rt shoulder    Referring Provider (PT) Dr Ramond Marrowax Varkey    Onset Date/Surgical Date 12/11/21   fall with injury 10/27/21   Hand  Dominance Right    Next MD Visit 6/23    Prior Therapy for fx hip      Palpation   Palpation comment increased muscular tightness Rt > Lt upeer trap to cervical musculature                           [facility] Adult PT Treatment/Exercise - 02/02/22 0001       Shoulder Exercises: Seated   Other Seated Exercises axial extension - 10 sec x 10; lateral cervical flexion      Shoulder Exercises: Standing   External Rotation Strengthening;Right;10 reps;Theraband    Theraband Level (Shoulder External Rotation) Level 3 (Green)    External Rotation Limitations reactive isometric    Internal Rotation Strengthening;Right;10 reps;Theraband    Theraband Level (Shoulder Internal Rotation) Level 3 (Green)    Internal Rotation Limitations reactive isometric    Extension Strengthening;Both;20 reps;Theraband    Theraband Level (Shoulder Extension) Level 3 (Green)    Row Strengthening;Both;10 reps;Theraband    Theraband Level (Shoulder Row) Level 3 (Green)    Other Standing Exercises scap squeeze 10 x 5 sec hold  Other Standing Exercises shoulder flexion bilat x 10 x 2 set; scaption x 10 x 2 sets focus on scapular control noodle along spine      Shoulder Exercises: Pulleys   Flexion Limitations 10 sec hold x 10 reps    Scaption Limitations 10 sec hold x10 reps    Other Pulley Exercises holding shoulder in some flexion and abduction for opening and closing - horizontal ab/add x 10 reps      Shoulder Exercises: ROM/Strengthening   Wall Pushups 20 reps      Shoulder Exercises: Stretch   Table Stretch - Flexion 5 reps;10 seconds      Vasopneumatic   Number Minutes Vasopneumatic  10 minutes    Vasopnuematic Location  Shoulder    Vasopneumatic Pressure Low    Vasopneumatic Temperature  34                     PT Education - 02/02/22 1430     Education Details HEP    Person(s) Educated Patient    Methods Explanation;Demonstration;Tactile cues;Verbal cues;Handout     Comprehension Verbalized understanding;Returned demonstration;Verbal cues required;Tactile cues required              PT Short Term Goals - 12/22/21 1245       PT SHORT TERM GOAL #1   Title HEP per protocol    Time 6    Period Weeks    Status New    Target Date 02/02/22      PT SHORT TERM GOAL #2   Title Increase PROM Rt shoulder per protocol    Time 6    Period Weeks    Status New    Target Date 02/02/22               PT Long Term Goals - 12/22/21 1246       PT LONG TERM GOAL #1   Title Improve posture and alignment with patient to demonstrate improved upright posture with posterior shoudler girdle musculature engaged and shoulders level in sitting and standing; Rt UE at side normally    Time 12    Period Weeks    Status New    Target Date 03/16/22      PT LONG TERM GOAL #2   Title Increase AROM Rt shoulder to WFL's allowing patient to perform all normal functional activities for ADL's and light household chores    Time 12    Period Weeks    Status New    Target Date 03/16/22      PT LONG TERM GOAL #3   Title Increased functional strength Rt UE to 4/5 to 4+/5    Time 12    Status New    Target Date 03/16/22      PT LONG TERM GOAL #4   Title Independent in HEP (including aquatic exercise program as indicated)    Time 12    Period Weeks    Status New    Target Date 03/16/22      PT LONG TERM GOAL #5   Title Improve functional limitation score to 31    Time 12    Period Weeks    Status New    Target Date 03/16/22                   Plan - 02/02/22 1410     Clinical Impression Statement Continued gradual progress with patient reporting increased functional use of Rt UE. She has some soreness  periodically and neck feels stiff in the past few days which she thinks is due to sleeping wring. Added gentle cervical ROM; continued with shoudler rehab. Progressing well.    Rehab Potential Good    PT Frequency 2x / week    PT Duration 12 weeks     PT Treatment/Interventions ADLs/Self Care Home Management;Aquatic Therapy;Cryotherapy;Electrical Stimulation;Iontophoresis 4mg /ml Dexamethasone;Moist Heat;Ultrasound;Therapeutic activities;Therapeutic exercise;Neuromuscular re-education;Patient/family education;Manual techniques;Passive range of motion;Dry needling;Taping;Vasopneumatic Device    PT Next Visit Plan progress HEP per protocol, manual therapy, PROM, Rt shoulder stretch and strengthening per protocol    PT Home Exercise Plan [email or web address]    Consulted and Agree with Plan of Care Patient             Patient will benefit from skilled therapeutic intervention in order to improve the following deficits and impairments:     Visit Diagnosis: Acute pain of right shoulder  Other symptoms and signs involving the musculoskeletal system  Abnormal posture  Muscle weakness (generalized)  Generalized edema     Problem List Patient Active Problem List   Diagnosis Date Noted   Status post reverse total arthroplasty of right shoulder 12/11/2021   Closed fracture dislocation of shoulder, right, initial encounter 10/27/2021   Osteopenia 08/03/2018   Closed intertrochanteric fracture of right hip status post nailing 07/26/2018   Premature atrial contraction 06/29/2018   Type 2 diabetes mellitus without complication, without long-term current use of insulin (HCC) 06/29/2018   Annual physical exam 08/19/2015   Primary osteoarthritis of right knee 08/19/2015   Renal insufficiency 08/15/2014   Panic disorder 05/23/2014   Major depressive disorder, recurrent severe without psychotic features (HCC) 08/06/2013   Hyperlipidemia 07/07/2012   Hypertension 07/05/2012    Amaurie Wandel 07/07/2012, PT, MPH 02/02/2022, 2:46 PM  [facility] [address] [place], [place], [place] Phone: [phone]   Fax:  [phone]  Name: Kristy Hays MRN: Kristy Hays Date of Birth: 1946-03-15

## 2022-02-10 ENCOUNTER — Ambulatory Visit: Payer: Medicare (Managed Care) | Admitting: Rehabilitative and Restorative Service Providers"

## 2022-02-10 ENCOUNTER — Other Ambulatory Visit: Payer: Self-pay | Admitting: Sports Medicine

## 2022-02-10 ENCOUNTER — Encounter: Payer: Self-pay | Admitting: Rehabilitative and Restorative Service Providers"

## 2022-02-10 DIAGNOSIS — F332 Major depressive disorder, recurrent severe without psychotic features: Secondary | ICD-10-CM

## 2022-02-10 DIAGNOSIS — R601 Generalized edema: Secondary | ICD-10-CM

## 2022-02-10 DIAGNOSIS — M25511 Pain in right shoulder: Secondary | ICD-10-CM

## 2022-02-10 DIAGNOSIS — R293 Abnormal posture: Secondary | ICD-10-CM

## 2022-02-10 DIAGNOSIS — R29898 Other symptoms and signs involving the musculoskeletal system: Secondary | ICD-10-CM

## 2022-02-10 DIAGNOSIS — M6281 Muscle weakness (generalized): Secondary | ICD-10-CM

## 2022-02-10 NOTE — Patient Instructions (Signed)
Access Code: [email or web address] URL: [email or web address] Date: 02/10/2022 Prepared by: Gillermo Murdoch  Exercises - Seated Cervical Retraction  - 2 x daily - 7 x weekly - 1-2 sets - 5-10 reps - 10 sec  hold - Seated Cervical Sidebending AROM  - 2 x daily - 7 x weekly - 1 sets - 5 reps - 5-10 sec  hold - Seated Cervical Rotation AROM  - 2 x daily - 7 x weekly - 1 sets - 5 reps - 2-3 sec  hold - Standing Scapular Retraction  - 3 x daily - 7 x weekly - 1 sets - 10 reps - 10 sec  hold - Seated Shoulder Flexion AAROM with Pulley Behind  - 2 x daily - 7 x weekly - 1 sets - 10 reps - 10 sec  hold - Isometric Shoulder Abduction at Wall  - 2 x daily - 7 x weekly - 1 sets - 5-10 reps - 5 sec  hold - Isometric Shoulder External Rotation at Wall  - 2 x daily - 7 x weekly - 1 sets - 5 reps - 5 sec  hold - Isometric Shoulder Flexion at Wall  - 2 x daily - 7 x weekly - 1 sets - 5-10 reps - 5 sec  hold - Standing Bilateral Low Shoulder Row with Anchored Resistance  - 1 x daily - 7 x weekly - 1-3 sets - 10 reps - 2-3 sec  hold - Shoulder External Rotation Reactive Isometrics  - 1 x daily - 7 x weekly - 1 sets - 10 reps - 3 sec  hold - Shoulder Internal Rotation Reactive Isometrics  - 1 x daily - 7 x weekly - 1 sets - 10 reps - 3-5 sec  hold - Standing Shoulder and Trunk Flexion at Table  - 2 x daily - 7 x weekly - 1 sets - 5 reps - 5-10 sec  hold - Standing Shoulder Flexion Wall Walk  - 2 x daily - 7 x weekly - 1 sets - 3 reps - 30 sec  hold - Wall Push Up  - 1 x daily - 7 x weekly - 1-3 sets - 10 reps - 3 sec  hold - Standing Shoulder Flexion to 90 Degrees  - 1 x daily - 7 x weekly - 2 sets - 10 reps - 2-3 sec  hold - Standing Shoulder Scaption  - 1 x daily - 7 x weekly - 2 sets - 10 reps - 3 sec  hold - Sidelying Shoulder External Rotation  - 1 x daily - 7 x weekly - 2 sets - 10 reps - 3 sec  hold - Shoulder External Rotation with Anchored Resistance  - 1 x daily - 7 x weekly - 1-2 sets - 10 reps - 3 sec   hold - Shoulder Internal Rotation with Resistance  - 1 x daily - 7 x weekly - 1-2 sets - 10 reps - 3 sec  hold

## 2022-02-10 NOTE — Therapy (Signed)
[facility] [place] [address] [place], [place], [address] Phone: [phone]   Fax:  [phone]  Physical Therapy Treatment  Rationale for Evaluation and Treatment Rehabilitation  Patient Details  Name: Kristy Hays MRN: [id number] Date of Birth: 10-07-1945 Referring Provider (PT): Dr Ramond Marrow   Encounter Date: 02/10/2022   PT End of Session - 02/10/22 1542     Visit Number 8    Number of Visits 20    Date for PT Re-Evaluation 03/16/22    PT Start Time 1535    PT Stop Time 1628    PT Time Calculation (min) 53 min    Activity Tolerance Patient tolerated treatment well             Past Medical History:  Diagnosis Date   Anxiety    Arthritis    Depression    Glaucoma    Hyperlipidemia    Hypertension    Proximal humerus fracture    right    Past Surgical History:  Procedure Laterality Date   ABDOMINAL HYSTERECTOMY     HIP FRACTURE SURGERY Right    REVERSE SHOULDER ARTHROPLASTY Right 12/11/2021   Procedure: REVERSE SHOULDER ARTHROPLASTY;  Surgeon: Bjorn Pippin, MD;  Location: [facility];  Service: Orthopedics;  Laterality: Right;    There were no vitals filed for this visit.   Subjective Assessment - 02/10/22 1543     Subjective Shoulder is doing well. She using arm for more functional activites. Can lift arm higher now.    Currently in Pain? No/denies    Pain Score 0-No pain    Pain Location Shoulder                [facility] PT Assessment - 02/10/22 0001       Assessment   Medical Diagnosis Fracture/dislocation Rt shoulder    Referring Provider (PT) Dr Ramond Marrow    Onset Date/Surgical Date 12/11/21   fall with injury 10/27/21   Hand Dominance Right    Next MD Visit 6/23    Prior Therapy for fx hip      Palpation   Palpation comment improving muscular tightness Rt shoulder girdle - some residual tightnessRt pecs, uppertrpa,teres, biceps                            [facility] Adult PT Treatment/Exercise - 02/10/22 0001       Shoulder Exercises: Supine   Other Supine Exercises stabilization shoudler at 90 deg flexion small range flex/ext; horizontal ab/ad; circles CW/CCW x 20 each      Shoulder Exercises: Standing   External Rotation Strengthening;Right;20 reps;Theraband    Theraband Level (Shoulder External Rotation) Level 1 (Yellow)    External Rotation Limitations active concentric    Internal Rotation Strengthening;Right;20 reps;Theraband    Theraband Level (Shoulder Internal Rotation) Level 1 (Yellow)    Internal Rotation Limitations active concentric    Extension Strengthening;Both;20 reps;Theraband    Theraband Level (Shoulder Extension) Level 3 (Green)    Row Strengthening;Both;10 reps;Theraband    Theraband Level (Shoulder Row) Level 3 (Green)    Other Standing Exercises scap squeeze 10 x 5 sec hold    Other Standing Exercises shoulder flexion bilat x 10 x 2 set; scaption x 10 x 2 sets focus on scapular control noodle along spine      Shoulder Exercises: Pulleys   Flexion Limitations 10 sec hold x 10 reps    Scaption  Limitations 10 sec hold x10 reps    Other Pulley Exercises holding shoulder in some flexion and abduction for opening and closing - horizontal ab/add x 10 reps      Shoulder Exercises: Therapy Ball   Flexion Both;5 reps    Flexion Limitations orange ball    Other Therapy Ball Exercises bouncing ball on wall ~ head height ~ 1 min x 2 reps      Shoulder Exercises: ROM/Strengthening   Wall Pushups 20 reps    Other ROM/Strengthening Exercises shoulder flexion on wall hands in pillow case slide up x 5; press and hold varying heights x 5; press and lift hand off wall varying heights x 3 reps      Shoulder Exercises: Stretch   Table Stretch - Flexion 5 reps;10 seconds      Vasopneumatic   Number Minutes Vasopneumatic  10 minutes    Vasopnuematic Location  Shoulder    Vasopneumatic Pressure Low     Vasopneumatic Temperature  34      Manual Therapy   Soft tissue mobilization soft tissue mobilization to Rt shoulder girdle    Passive ROM PROM Rt shoulder in flexion; scaption; ER in scapular plane - all motions within tissue limits and pt tolerance                     PT Education - 02/10/22 1626     Education Details HEP    Person(s) Educated Patient    Methods Explanation;Demonstration;Tactile cues;Verbal cues;Handout    Comprehension Verbalized understanding;Returned demonstration;Verbal cues required;Tactile cues required              PT Short Term Goals - 12/22/21 1245       PT SHORT TERM GOAL #1   Title HEP per protocol    Time 6    Period Weeks    Status New    Target Date 02/02/22      PT SHORT TERM GOAL #2   Title Increase PROM Rt shoulder per protocol    Time 6    Period Weeks    Status New    Target Date 02/02/22               PT Long Term Goals - 12/22/21 1246       PT LONG TERM GOAL #1   Title Improve posture and alignment with patient to demonstrate improved upright posture with posterior shoudler girdle musculature engaged and shoulders level in sitting and standing; Rt UE at side normally    Time 12    Period Weeks    Status New    Target Date 03/16/22      PT LONG TERM GOAL #2   Title Increase AROM Rt shoulder to WFL's allowing patient to perform all normal functional activities for ADL's and light household chores    Time 12    Period Weeks    Status New    Target Date 03/16/22      PT LONG TERM GOAL #3   Title Increased functional strength Rt UE to 4/5 to 4+/5    Time 12    Status New    Target Date 03/16/22      PT LONG TERM GOAL #4   Title Independent in HEP (including aquatic exercise program as indicated)    Time 12    Period Weeks    Status New    Target Date 03/16/22      PT LONG TERM GOAL #5  Title Improve functional limitation score to 31    Time 12    Period Weeks    Status New    Target Date  03/16/22                   Plan - 02/10/22 1622     Clinical Impression Statement Progressing well. Patient is using Rt UE for more functional activities. ROM/mobility is increasing, muscular tightness is decreasing. Added strengthening activities/exercises overhead and concentric TB IR/ER. Continue strengthening and stabilization Rt shoulder/shoulder girdle    Rehab Potential Good    PT Frequency 2x / week    PT Duration 12 weeks    PT Treatment/Interventions ADLs/Self Care Home Management;Aquatic Therapy;Cryotherapy;Electrical Stimulation;Iontophoresis 4mg /ml Dexamethasone;Moist Heat;Ultrasound;Therapeutic activities;Therapeutic exercise;Neuromuscular re-education;Patient/family education;Manual techniques;Passive range of motion;Dry needling;Taping;Vasopneumatic Device    PT Next Visit Plan progress HEP per protocol, manual therapy, PROM, Rt shoulder stretch and strengthening per protocol    PT Home Exercise Plan [email or web address]    Consulted and Agree with Plan of Care Patient             Patient will benefit from skilled therapeutic intervention in order to improve the following deficits and impairments:     Visit Diagnosis: Acute pain of right shoulder  Other symptoms and signs involving the musculoskeletal system  Abnormal posture  Muscle weakness (generalized)  Generalized edema     Problem List Patient Active Problem List   Diagnosis Date Noted   Status post reverse total arthroplasty of right shoulder 12/11/2021   Closed fracture dislocation of shoulder, right, initial encounter 10/27/2021   Osteopenia 08/03/2018   Closed intertrochanteric fracture of right hip status post nailing 07/26/2018   Premature atrial contraction 06/29/2018   Type 2 diabetes mellitus without complication, without long-term current use of insulin ([place]) 06/29/2018   Annual physical exam 08/19/2015   Primary osteoarthritis of right knee 08/19/2015   Renal insufficiency 08/15/2014    Panic disorder 05/23/2014   Major depressive disorder, recurrent severe without psychotic features ([place]) 08/06/2013   Hyperlipidemia 07/07/2012   Hypertension 07/05/2012    Bobbie Virden Nilda Simmer, PT, MPH  02/10/2022, 4:28 PM  [address] [place], [place], [address] Phone: [phone]   Fax:  [phone]  Name: Kristy Hays MRN: [id number] Date of Birth: [birth year]

## 2022-02-12 ENCOUNTER — Encounter: Payer: PRIVATE HEALTH INSURANCE | Admitting: Rehabilitative and Restorative Service Providers"

## 2022-02-23 ENCOUNTER — Ambulatory Visit
Payer: Medicare (Managed Care) | Attending: Orthopaedic Surgery | Admitting: Rehabilitative and Restorative Service Providers"

## 2022-02-23 ENCOUNTER — Encounter: Payer: Self-pay | Admitting: Rehabilitative and Restorative Service Providers"

## 2022-02-23 DIAGNOSIS — R29898 Other symptoms and signs involving the musculoskeletal system: Secondary | ICD-10-CM | POA: Insufficient documentation

## 2022-02-23 DIAGNOSIS — R293 Abnormal posture: Secondary | ICD-10-CM | POA: Diagnosis not present

## 2022-02-23 DIAGNOSIS — M6281 Muscle weakness (generalized): Secondary | ICD-10-CM | POA: Diagnosis not present

## 2022-02-23 DIAGNOSIS — M25511 Pain in right shoulder: Secondary | ICD-10-CM | POA: Insufficient documentation

## 2022-02-23 DIAGNOSIS — R601 Generalized edema: Secondary | ICD-10-CM | POA: Insufficient documentation

## 2022-02-23 NOTE — Therapy (Signed)
[facility] [address] [place], [place], [address] Phone: [phone]   Fax:  [phone]  Physical Therapy Treatment Rationale for Evaluation and Treatment Rehabilitation  Patient Details  Name: Kristy Hays MRN: [id number] Date of Birth: 26-Jun-1946 Referring Provider (PT): Dr Ramond Marrowax Varkey   Encounter Date: 02/23/2022   PT End of Session - 02/23/22 1412     Visit Number 9    Number of Visits 20    Date for PT Re-Evaluation 03/16/22    PT Start Time 1402    PT Stop Time 1450    PT Time Calculation (min) 48 min    Activity Tolerance Patient tolerated treatment well             Past Medical History:  Diagnosis Date   Anxiety    Arthritis    Depression    Glaucoma    Hyperlipidemia    Hypertension    Proximal humerus fracture    right    Past Surgical History:  Procedure Laterality Date   ABDOMINAL HYSTERECTOMY     HIP FRACTURE SURGERY Right    REVERSE SHOULDER ARTHROPLASTY Right 12/11/2021   Procedure: REVERSE SHOULDER ARTHROPLASTY;  Surgeon: Bjorn PippinVarkey, Dax T, MD;  Location: [facility];  Service: Orthopedics;  Laterality: Right;    There were no vitals filed for this visit.   Subjective Assessment - 02/23/22 1415     Subjective Using Rt UE for more functional activities.    Currently in Pain? No/denies    Pain Score 0-No pain    Pain Location Shoulder                [facility] PT Assessment - 02/23/22 0001       Assessment   Medical Diagnosis Fracture/dislocation Rt shoulder    Referring Provider (PT) Dr Ramond Marrowax Varkey    Onset Date/Surgical Date 12/11/21   fall with injury 10/27/21   Hand Dominance Right    Next MD Visit 03/05/22    Prior Therapy for fx hip      Posture/Postural Control   Posture Comments head forward; shoulders rounded and elevated; Rt shoulder elevated compared to Lt - noted elevation of Rt hemishoulder with functional activities                            [facility] Adult PT Treatment/Exercise - 02/23/22 0001       Shoulder Exercises: Supine   Other Supine Exercises stabilization shoudler at 90 deg flexion small range flex/ext; horizontal ab/ad; circles CW/CCW x 20 each      Shoulder Exercises: Seated   Horizontal ABduction Limitations W's x 15 yellow TB    Other Seated Exercises axial extension - 10 sec x 10; lateral cervical flexion    Other Seated Exercises elbow extension with shd supported at 90 deg yellow TB x 10 reps      Shoulder Exercises: Standing   Flexion 10 reps;AROM;AAROM;Strengthening;5 reps    Flexion Limitations lifting hand away from wall in end of available range in flexion x 8 reps    Other Standing Exercises scap squeeze 10 x 5 sec hold    Other Standing Exercises shoulder flexion bilat x 10 x 2 set; scaption x 10 x 2 sets focus on scapular control noodle along spine      Shoulder Exercises: ROM/Strengthening   Wall Pushups 20 reps    Other ROM/Strengthening Exercises shoulder flexion on wall hands in pillow  case slide up x 5; press and hold varying heights x 5; press and lift hand off wall varying heights x 3 reps      Shoulder Exercises: Stretch   Table Stretch - Flexion 5 reps;10 seconds      Vasopneumatic   Number Minutes Vasopneumatic  10 minutes    Vasopnuematic Location  Shoulder    Vasopneumatic Pressure Low    Vasopneumatic Temperature  34                     PT Education - 02/23/22 1435     Education Details HEP    Person(s) Educated Patient    Methods Explanation;Demonstration;Tactile cues;Verbal cues;Handout    Comprehension Verbalized understanding;Returned demonstration;Verbal cues required;Tactile cues required              PT Short Term Goals - 12/22/21 1245       PT SHORT TERM GOAL #1   Title HEP per protocol    Time 6    Period Weeks    Status New    Target Date 02/02/22      PT SHORT TERM GOAL #2   Title Increase PROM Rt shoulder per  protocol    Time 6    Period Weeks    Status New    Target Date 02/02/22               PT Long Term Goals - 12/22/21 1246       PT LONG TERM GOAL #1   Title Improve posture and alignment with patient to demonstrate improved upright posture with posterior shoudler girdle musculature engaged and shoulders level in sitting and standing; Rt UE at side normally    Time 12    Period Weeks    Status New    Target Date 03/16/22      PT LONG TERM GOAL #2   Title Increase AROM Rt shoulder to WFL's allowing patient to perform all normal functional activities for ADL's and light household chores    Time 12    Period Weeks    Status New    Target Date 03/16/22      PT LONG TERM GOAL #3   Title Increased functional strength Rt UE to 4/5 to 4+/5    Time 12    Status New    Target Date 03/16/22      PT LONG TERM GOAL #4   Title Independent in HEP (including aquatic exercise program as indicated)    Time 12    Period Weeks    Status New    Target Date 03/16/22      PT LONG TERM GOAL #5   Title Improve functional limitation score to 31    Time 12    Period Weeks    Status New    Target Date 03/16/22                   Plan - 02/23/22 1416     Clinical Impression Statement Continued improvement in functional strength and activities with Rt UE. Patient added strengthening. Note to MD at next visit.    Rehab Potential Good    PT Frequency 2x / week    PT Duration 12 weeks    PT Treatment/Interventions ADLs/Self Care Home Management;Aquatic Therapy;Cryotherapy;Electrical Stimulation;Iontophoresis 4mg /ml Dexamethasone;Moist Heat;Ultrasound;Therapeutic activities;Therapeutic exercise;Neuromuscular re-education;Patient/family education;Manual techniques;Passive range of motion;Dry needling;Taping;Vasopneumatic Device    PT Next Visit Plan progress HEP per protocol, manual therapy, PROM, Rt shoulder stretch and  strengthening per protocol    PT Home Exercise Plan [id number]     Consulted and Agree with Plan of Care Patient             Patient will benefit from skilled therapeutic intervention in order to improve the following deficits and impairments:     Visit Diagnosis: Acute pain of right shoulder  Other symptoms and signs involving the musculoskeletal system  Abnormal posture  Muscle weakness (generalized)  Generalized edema     Problem List Patient Active Problem List   Diagnosis Date Noted   Status post reverse total arthroplasty of right shoulder 12/11/2021   Closed fracture dislocation of shoulder, right, initial encounter 10/27/2021   Osteopenia 08/03/2018   Closed intertrochanteric fracture of right hip status post nailing 07/26/2018   Premature atrial contraction 06/29/2018   Type 2 diabetes mellitus without complication, without long-term current use of insulin ([facility]) 06/29/2018   Annual physical exam 08/19/2015   Primary osteoarthritis of right knee 08/19/2015   Renal insufficiency 08/15/2014   Panic disorder 05/23/2014   Major depressive disorder, recurrent severe without psychotic features ([facility]) 08/06/2013   Hyperlipidemia 07/07/2012   Hypertension 07/05/2012    Sophiya Morello Rober Minion, PT, MPH  02/23/2022, 2:56 PM  [facility] [address] [place], [place], [address] Phone: [phone]   Fax:  [phone]  Name: Kristy Hays MRN: [id number] Date of Birth: January 09, 1946

## 2022-02-23 NOTE — Patient Instructions (Signed)
Access Code: [email or web address] URL: [email or web address] Date: 02/23/2022 Prepared by: Corlis Leak  Exercises - Seated Cervical Retraction  - 2 x daily - 7 x weekly - 1-2 sets - 5-10 reps - 10 sec  hold - Seated Cervical Sidebending AROM  - 2 x daily - 7 x weekly - 1 sets - 5 reps - 5-10 sec  hold - Seated Cervical Rotation AROM  - 2 x daily - 7 x weekly - 1 sets - 5 reps - 2-3 sec  hold - Standing Scapular Retraction  - 3 x daily - 7 x weekly - 1 sets - 10 reps - 10 sec  hold - Seated Shoulder Flexion AAROM with Pulley Behind  - 2 x daily - 7 x weekly - 1 sets - 10 reps - 10 sec  hold - Isometric Shoulder Abduction at Wall  - 2 x daily - 7 x weekly - 1 sets - 5-10 reps - 5 sec  hold - Isometric Shoulder External Rotation at Wall  - 2 x daily - 7 x weekly - 1 sets - 5 reps - 5 sec  hold - Isometric Shoulder Flexion at Wall  - 2 x daily - 7 x weekly - 1 sets - 5-10 reps - 5 sec  hold - Standing Bilateral Low Shoulder Row with Anchored Resistance  - 1 x daily - 7 x weekly - 1-3 sets - 10 reps - 2-3 sec  hold - Shoulder External Rotation Reactive Isometrics  - 1 x daily - 7 x weekly - 1 sets - 10 reps - 3 sec  hold - Shoulder Internal Rotation Reactive Isometrics  - 1 x daily - 7 x weekly - 1 sets - 10 reps - 3-5 sec  hold - Standing Shoulder and Trunk Flexion at Table  - 2 x daily - 7 x weekly - 1 sets - 5 reps - 5-10 sec  hold - Standing Shoulder Flexion Wall Walk  - 2 x daily - 7 x weekly - 1 sets - 3 reps - 30 sec  hold - Wall Push Up  - 1 x daily - 7 x weekly - 1-3 sets - 10 reps - 3 sec  hold - Standing Shoulder Flexion to 90 Degrees  - 1 x daily - 7 x weekly - 2 sets - 10 reps - 2-3 sec  hold - Standing Shoulder Scaption  - 1 x daily - 7 x weekly - 2 sets - 10 reps - 3 sec  hold - Sidelying Shoulder External Rotation  - 1 x daily - 7 x weekly - 2 sets - 10 reps - 3 sec  hold - Shoulder External Rotation with Anchored Resistance  - 1 x daily - 7 x weekly - 1-2 sets - 10 reps - 3 sec   hold - Shoulder Internal Rotation with Resistance  - 1 x daily - 7 x weekly - 1-2 sets - 10 reps - 3 sec  hold - Shoulder W - External Rotation with Resistance  - 2 x daily - 7 x weekly - 1-2 sets - 10 reps - 3 sec  hold

## 2022-03-03 ENCOUNTER — Ambulatory Visit: Payer: Medicare (Managed Care) | Admitting: Rehabilitative and Restorative Service Providers"

## 2022-03-03 ENCOUNTER — Encounter: Payer: Self-pay | Admitting: Rehabilitative and Restorative Service Providers"

## 2022-03-03 DIAGNOSIS — M25511 Pain in right shoulder: Secondary | ICD-10-CM | POA: Diagnosis not present

## 2022-03-03 DIAGNOSIS — M6281 Muscle weakness (generalized): Secondary | ICD-10-CM

## 2022-03-03 DIAGNOSIS — R293 Abnormal posture: Secondary | ICD-10-CM

## 2022-03-03 DIAGNOSIS — R29898 Other symptoms and signs involving the musculoskeletal system: Secondary | ICD-10-CM

## 2022-03-03 NOTE — Therapy (Signed)
[facility] [place] [address] [place], [place], [address] Phone: [phone]   Fax:  [phone]  Physical Therapy Treatment; Medicare 10th visit note;  Recertification  Progress Note Reporting Period 12/22/21 to 03/03/22  See note below for Objective Data and Assessment of Progress/Goals.       Rationale for Evaluation and Treatment Rehabilitation  Patient Details  Name: Kristy Hays MRN: [id number] Date of Birth: May 09, 1946 Referring Provider (PT): Dr Ramond Marrow   Encounter Date: 03/03/2022   PT End of Session - 03/03/22 1450     Visit Number 10    Number of Visits 20    Date for PT Re-Evaluation 03/16/22    PT Start Time 1445    PT Stop Time 1534    PT Time Calculation (min) 49 min    Activity Tolerance Patient tolerated treatment well             Past Medical History:  Diagnosis Date   Anxiety    Arthritis    Depression    Glaucoma    Hyperlipidemia    Hypertension    Proximal humerus fracture    right    Past Surgical History:  Procedure Laterality Date   ABDOMINAL HYSTERECTOMY     HIP FRACTURE SURGERY Right    REVERSE SHOULDER ARTHROPLASTY Right 12/11/2021   Procedure: REVERSE SHOULDER ARTHROPLASTY;  Surgeon: Bjorn Pippin, MD;  Location: [facility];  Service: Orthopedics;  Laterality: Right;    There were no vitals filed for this visit.   Subjective Assessment - 03/03/22 1513     Subjective Using Rt UE for more functional activities. Minimal pain. Please with progress but knows she needs to be stronger.    Currently in Pain? No/denies    Pain Score 0-No pain    Pain Location Shoulder    Pain Orientation Right    Pain Descriptors / Indicators Sore    Pain Type Chronic pain;Surgical pain                [facility] PT Assessment - 03/03/22 0001       Assessment   Medical Diagnosis Fracture/dislocation Rt shoulder    Referring Provider (PT) Dr Ramond Marrow    Onset  Date/Surgical Date 12/11/21   fall with injury 10/27/21   Hand Dominance Right    Next MD Visit 03/05/22    Prior Therapy for fx hip      Posture/Postural Control   Posture Comments head forward; shoulders rounded and elevated; Rt shoulder elevated compared to Lt - noted elevation of Rt hemishoulder with functional activities      AROM   Right Shoulder Extension 32 Degrees    Right Shoulder Flexion 104 Degrees   some elevation Rt shoulder girdle   Right Shoulder ABduction 100 Degrees   some elevation Rt shoulder girdle   Right Shoulder Internal Rotation --   hand tolaterated Rt hip   Right Shoulder External Rotation 47 Degrees    Left Shoulder Extension 49 Degrees    Left Shoulder Flexion 132 Degrees    Left Shoulder ABduction 139 Degrees    Left Shoulder Internal Rotation --   thumb T10-11   Left Shoulder External Rotation 40 Degrees      PROM   Right Shoulder Extension 55 Degrees    Right Shoulder Flexion 141 Degrees    Right Shoulder ABduction 136 Degrees   in scapular plane   Right Shoulder Internal Rotation 62 Degrees   in  scapular plane   Right Shoulder External Rotation 65 Degrees   in scapular plane     Strength   Overall Strength Comments assessed with pt sitting Rt UE in available range/neutral    Right Shoulder Flexion 4/5    Right Shoulder Extension 4+/5    Right Shoulder ABduction 4-/5    Right Shoulder Internal Rotation 4-/5    Right Shoulder External Rotation 4-/5      Palpation   Palpation comment improving muscular tightness Rt shoulder girdle - some residual tightnessRt pecs, uppertrpa,teres, biceps                           [facility] Adult PT Treatment/Exercise - 03/03/22 0001       Shoulder Exercises: Supine   Other Supine Exercises stabilization shoudler at 90 deg flexion small range flex/ext; horizontal ab/ad; circles CW/CCW x 20 each      Shoulder Exercises: Seated   Horizontal ABduction Limitations W's x 15 yellow TB    Other Seated  Exercises axial extension - 10 sec x 10; lateral cervical flexion    Other Seated Exercises elbow extension with shd supported at 90 deg yellow TB x 10 reps      Shoulder Exercises: Standing   External Rotation Strengthening;Right;10 reps;Theraband    Theraband Level (Shoulder External Rotation) Level 1 (Yellow)    External Rotation Limitations active concentric    Internal Rotation Strengthening;Right;10 reps;Theraband    Theraband Level (Shoulder Internal Rotation) Level 1 (Yellow)    Internal Rotation Limitations active concentric    Flexion 10 reps;AROM;AAROM;Strengthening;5 reps    Flexion Limitations lifting hand away from wall in end of available range in flexion x 8 reps    Row Strengthening;Both;20 reps;Theraband    Theraband Level (Shoulder Row) Level 3 (Green)      Shoulder Exercises: Pulleys   Flexion Limitations 10 sec hold x 10 reps    Scaption Limitations 10 sec hold x10 reps      Shoulder Exercises: ROM/Strengthening   Wall Pushups 20 reps    Other ROM/Strengthening Exercises shoulder flexion on wall hands in pillow case slide up x 5; press and hold varying heights x 5; press and lift hand off wall varying heights x 3 reps      Shoulder Exercises: Stretch   Other Shoulder Stretches self ROM shoulder flexion with assist of Lt UE in supine; gentle IR in sidelying dropping hand behind head                     PT Education - 03/03/22 1548     Education Details HEP    Person(s) Educated Patient    Methods Explanation;Demonstration;Tactile cues;Verbal cues;Handout    Comprehension Verbalized understanding;Returned demonstration;Verbal cues required;Tactile cues required              PT Short Term Goals - 12/22/21 1245       PT SHORT TERM GOAL #1   Title HEP per protocol    Time 6    Period Weeks    Status New    Target Date 02/02/22      PT SHORT TERM GOAL #2   Title Increase PROM Rt shoulder per protocol    Time 6    Period Weeks    Status  New    Target Date 02/02/22               PT Long Term Goals - 03/03/22 1514  PT LONG TERM GOAL #1   Title Improve posture and alignment with patient to demonstrate improved upright posture with posterior shoudler girdle musculature engaged and shoulders level in sitting and standing; Rt UE at side normally    Time 18    Period Weeks    Status On-going    Target Date 04/21/22      PT LONG TERM GOAL #2   Title Increase AROM Rt shoulder to WFL's allowing patient to perform all normal functional activities for ADL's and light household chores    Time 18    Period Weeks    Status On-going    Target Date 04/21/22      PT LONG TERM GOAL #3   Title Increased functional strength Rt UE to 4/5 to 4+/5    Time 18    Period Weeks    Status On-going    Target Date 04/21/22      PT LONG TERM GOAL #4   Title Independent in HEP (including aquatic exercise program as indicated)    Time 18    Period Weeks    Status On-going    Target Date 04/21/22      PT LONG TERM GOAL #5   Title Improve functional limitation score to 31    Time 18    Period Weeks    Status Achieved    Target Date 03/16/22                   Plan - 03/03/22 1539     Clinical Impression Statement No pain but does have some soreness at times Rt UE. Continues to progress with AROM, PROM, strength. Goals of therapy are partially accomplished. Patient will benefit from continued treatment with decreased frequency to gain ROM, strength, function Rt UE.    Rehab Potential Good    PT Frequency 1x / week    PT Duration 8 weeks    PT Treatment/Interventions ADLs/Self Care Home Management;Aquatic Therapy;Cryotherapy;Electrical Stimulation;Iontophoresis 4mg /ml Dexamethasone;Moist Heat;Ultrasound;Therapeutic activities;Therapeutic exercise;Neuromuscular re-education;Patient/family education;Manual techniques;Passive range of motion;Dry needling;Taping;Vasopneumatic Device    PT Next Visit Plan progress HEP  manual therapy, PROM, Rt shoulder stretch and strengthening per protocol    PT Home Exercise Plan [email or web address]    Consulted and Agree with Plan of Care Patient             Patient will benefit from skilled therapeutic intervention in order to improve the following deficits and impairments:     Visit Diagnosis: Acute pain of right shoulder  Other symptoms and signs involving the musculoskeletal system  Abnormal posture  Muscle weakness (generalized)     Problem List Patient Active Problem List   Diagnosis Date Noted   Status post reverse total arthroplasty of right shoulder 12/11/2021   Closed fracture dislocation of shoulder, right, initial encounter 10/27/2021   Osteopenia 08/03/2018   Closed intertrochanteric fracture of right hip status post nailing 07/26/2018   Premature atrial contraction 06/29/2018   Type 2 diabetes mellitus without complication, without long-term current use of insulin (HCC) 06/29/2018   Annual physical exam 08/19/2015   Primary osteoarthritis of right knee 08/19/2015   Renal insufficiency 08/15/2014   Panic disorder 05/23/2014   Major depressive disorder, recurrent severe without psychotic features (HCC) 08/06/2013   Hyperlipidemia 07/07/2012   Hypertension 07/05/2012    Kristy Hays 07/07/2012, PT, MPH  03/03/2022, 3:49 PM  [facility] [place] [address] [place], [place], [place] Phone: [phone]   Fax:  [phone]  Name: Kristy Hays MRN: [id number] Date of Birth: 20-Feb-1946

## 2022-03-03 NOTE — Patient Instructions (Signed)
Access Code: [email or web address] URL: [email or web address] Date: 03/03/2022 Prepared by: Corlis Leak  Exercises - Seated Cervical Retraction  - 2 x daily - 7 x weekly - 1-2 sets - 5-10 reps - 10 sec  hold - Seated Cervical Sidebending AROM  - 2 x daily - 7 x weekly - 1 sets - 5 reps - 5-10 sec  hold - Seated Cervical Rotation AROM  - 2 x daily - 7 x weekly - 1 sets - 5 reps - 2-3 sec  hold - Standing Scapular Retraction  - 3 x daily - 7 x weekly - 1 sets - 10 reps - 10 sec  hold - Seated Shoulder Flexion AAROM with Pulley Behind  - 2 x daily - 7 x weekly - 1 sets - 10 reps - 10 sec  hold - Isometric Shoulder Abduction at Wall  - 2 x daily - 7 x weekly - 1 sets - 5-10 reps - 5 sec  hold - Isometric Shoulder External Rotation at Wall  - 2 x daily - 7 x weekly - 1 sets - 5 reps - 5 sec  hold - Isometric Shoulder Flexion at Wall  - 2 x daily - 7 x weekly - 1 sets - 5-10 reps - 5 sec  hold - Standing Bilateral Low Shoulder Row with Anchored Resistance  - 1 x daily - 7 x weekly - 1-3 sets - 10 reps - 2-3 sec  hold - Shoulder External Rotation Reactive Isometrics  - 1 x daily - 7 x weekly - 1 sets - 10 reps - 3 sec  hold - Shoulder Internal Rotation Reactive Isometrics  - 1 x daily - 7 x weekly - 1 sets - 10 reps - 3-5 sec  hold - Standing Shoulder and Trunk Flexion at Table  - 2 x daily - 7 x weekly - 1 sets - 5 reps - 5-10 sec  hold - Standing Shoulder Flexion Wall Walk  - 2 x daily - 7 x weekly - 1 sets - 3 reps - 30 sec  hold - Wall Push Up  - 1 x daily - 7 x weekly - 1-3 sets - 10 reps - 3 sec  hold - Standing Shoulder Flexion to 90 Degrees  - 1 x daily - 7 x weekly - 2 sets - 10 reps - 2-3 sec  hold - Standing Shoulder Scaption  - 1 x daily - 7 x weekly - 2 sets - 10 reps - 3 sec  hold - Sidelying Shoulder External Rotation  - 1 x daily - 7 x weekly - 2 sets - 10 reps - 3 sec  hold - Shoulder External Rotation with Anchored Resistance  - 1 x daily - 7 x weekly - 1-2 sets - 10 reps - 3 sec   hold - Shoulder Internal Rotation with Resistance  - 1 x daily - 7 x weekly - 1-2 sets - 10 reps - 3 sec  hold - Shoulder W - External Rotation with Resistance  - 2 x daily - 7 x weekly - 1-2 sets - 10 reps - 3 sec  hold - Standing Shoulder Extension with Dowel  - 2 x daily - 7 x weekly - 1 sets - 5-10 reps - 5-10 sec  hold

## 2022-03-05 DIAGNOSIS — S42201D Unspecified fracture of upper end of right humerus, subsequent encounter for fracture with routine healing: Secondary | ICD-10-CM | POA: Diagnosis not present

## 2022-03-09 ENCOUNTER — Other Ambulatory Visit: Payer: Self-pay

## 2022-03-09 DIAGNOSIS — E7800 Pure hypercholesterolemia, unspecified: Secondary | ICD-10-CM

## 2022-03-09 MED ORDER — CARVEDILOL 25 MG PO TABS: 25.0000 mg | ORAL_TABLET | Freq: Two times a day (BID) | ORAL | 0 refills | Status: DC

## 2022-03-18 ENCOUNTER — Ambulatory Visit
Payer: Medicare (Managed Care) | Attending: Orthopaedic Surgery | Admitting: Rehabilitative and Restorative Service Providers"

## 2022-03-18 ENCOUNTER — Encounter: Payer: Self-pay | Admitting: Rehabilitative and Restorative Service Providers"

## 2022-03-18 DIAGNOSIS — M6281 Muscle weakness (generalized): Secondary | ICD-10-CM | POA: Diagnosis not present

## 2022-03-18 DIAGNOSIS — R293 Abnormal posture: Secondary | ICD-10-CM | POA: Insufficient documentation

## 2022-03-18 DIAGNOSIS — R29898 Other symptoms and signs involving the musculoskeletal system: Secondary | ICD-10-CM | POA: Insufficient documentation

## 2022-03-18 DIAGNOSIS — M25511 Pain in right shoulder: Secondary | ICD-10-CM | POA: Insufficient documentation

## 2022-03-18 NOTE — Therapy (Addendum)
[place], [place], [address] Phone: [phone]   Fax:  [phone]  Physical Therapy Treatment and Discharge Summary PHYSICAL THERAPY DISCHARGE SUMMARY  Visits from Start of Care: 11  Current functional level related to goals / functional outcomes: See last progress note for discharge status   Remaining deficits: Needs to continue with stretching and strengthening - she does have limited ROM   Education / Equipment: HEP    Patient agrees to discharge. Patient goals were met. Patient is being discharged due to meeting the stated rehab goals.  [name] P. Helene Kelp PT, MPH 04/30/22 12:46 PM  Rationale for Evaluation and Treatment Rehabilitation  Patient Details  Name: Kristy Hays MRN: [id number] Date of Birth: 1945-10-29 Referring Provider (PT): Dr Ophelia Charter   Encounter Date: 03/18/2022   PT End of Session - 03/18/22 1318     Visit Number 11    Number of Visits 20    Date for PT Re-Evaluation 05/27/22    Progress Note Due on Visit 20    PT Start Time 1315    PT Stop Time 1403    PT Time Calculation (min) 48 min    Activity Tolerance Patient tolerated treatment well             Past Medical History:  Diagnosis Date   Anxiety    Arthritis    Depression    Glaucoma    Hyperlipidemia    Hypertension    Proximal humerus fracture    right    Past Surgical History:  Procedure Laterality Date   ABDOMINAL HYSTERECTOMY     HIP FRACTURE SURGERY Right    REVERSE SHOULDER ARTHROPLASTY Right 12/11/2021   Procedure: REVERSE SHOULDER ARTHROPLASTY;  Surgeon: Hiram Gash, MD;  Location: [facility];  Service: Orthopedics;  Laterality: Right;    There were no vitals filed for this visit.   Subjective Assessment - 03/18/22 1319     Subjective Using Rt UE for more functional activities. Some difficulty reaching behind her. Minimal pain. Please with progress but knows she needs  to be stronger.    Currently in Pain? No/denies    Pain Score 0-No pain    Pain Location Shoulder    Pain Orientation Right    Pain Descriptors / Indicators Sore    Pain Type Chronic pain                [facility] PT Assessment - 03/18/22 0001       Assessment   Medical Diagnosis Fracture/dislocation Rt shoulder    Referring Provider (PT) Dr Ophelia Charter    Onset Date/Surgical Date 12/11/21   fall with injury 10/27/21   Hand Dominance Right    Next MD Visit 03/05/22    Prior Therapy for fx hip      Posture/Postural Control   Posture Comments head forward; shoulders rounded and elevated; Rt shoulder elevated compared to Lt - noted elevation of Rt hemishoulder with functional activities      AROM   Right Shoulder Extension 32 Degrees    Right Shoulder Flexion 104 Degrees   some elevation Rt shoulder girdle   Right Shoulder ABduction 100 Degrees   some elevation Rt shoulder girdle   Right Shoulder Internal Rotation --   hand to laterated waist on Rt   Right Shoulder External Rotation 47 Degrees    Left Shoulder Extension 49 Degrees    Left Shoulder Flexion 132 Degrees  Left Shoulder ABduction 139 Degrees    Left Shoulder Internal Rotation --   thumb T10-11   Left Shoulder External Rotation 40 Degrees      PROM   Right Shoulder Extension 55 Degrees    Right Shoulder Flexion 141 Degrees    Right Shoulder ABduction 136 Degrees   in scapular plane   Right Shoulder Internal Rotation 62 Degrees   in scapular plane   Right Shoulder External Rotation 65 Degrees   in scapular plane     Strength   Overall Strength Comments assessed with pt sitting Rt UE in available range/neutral    Right Shoulder Flexion 4/5    Right Shoulder Extension 4+/5    Right Shoulder ABduction 4-/5    Right Shoulder Internal Rotation 4-/5    Right Shoulder External Rotation 4-/5      Palpation   Palpation comment improving muscular tightness Rt shoulder girdle - some residual tightnessRt pecs,  uppertrpa,teres, biceps                           [facility] Adult PT Treatment/Exercise - 03/18/22 0001       Neuro Re-ed    Neuro Re-ed Details  continued postural correction engaging posterior shoulder girdle musculature improving alignment and quality of movement Rt UE      Elbow Exercises   Elbow Flexion --   shoulder bent row 2# x 10; shoulder/arm extension 3# x 10; horizontal abduction 2# x 10     Shoulder Exercises: Seated   Horizontal ABduction Strengthening;Both;20 reps;Theraband    Theraband Level (Shoulder Horizontal ABduction) Level 2 (Red)    Horizontal ABduction Limitations W's x 15 yellow TB      Shoulder Exercises: Standing   Horizontal ABduction Strengthening;Both;20 reps;Weights    Horizontal ABduction Weight (lbs) 2    Horizontal ABduction Limitations thumb to ceiling bent fwd    Other Standing Exercises scap squeeze 10 x 5 sec hold    Other Standing Exercises bent row Rt UE 3# x 2 sets      Shoulder Exercises: Pulleys   Flexion Limitations 10 sec hold x 10 reps    Scaption Limitations 10 sec hold x10 reps    Other Pulley Exercises holding shoulder in some flexion and abduction for opening and closing - horizontal ab/add x 10 reps      Shoulder Exercises: Stretch   Internal Rotation Stretch 3 reps    Internal Rotation Stretch Limitations 10 sec hold pulling cane up back; repeated with strap - pulling Rt hand across buttocks using strap with Lt UE 5 reps x 10-20 sec hold      Shoulder Exercises: Body Blade   Flexion 30 seconds;3 reps    Flexion Limitations seated each UE    Other Body Blade Exercises fwd flexion bilat hands moving lap to chest level x 5 reps      Vasopneumatic   Number Minutes Vasopneumatic  10 minutes    Vasopnuematic Location  Shoulder    Vasopneumatic Pressure Low    Vasopneumatic Temperature  34                     PT Education - 03/18/22 1403     Education Details HEP    Person(s) Educated Patient     Methods Explanation;Demonstration;Tactile cues;Verbal cues;Handout    Comprehension Verbalized understanding;Returned demonstration;Verbal cues required;Tactile cues required  PT Short Term Goals - 12/22/21 1245       PT SHORT TERM GOAL #1   Title HEP per protocol    Time 6    Period Weeks    Status New    Target Date 02/02/22      PT SHORT TERM GOAL #2   Title Increase PROM Rt shoulder per protocol    Time 6    Period Weeks    Status New    Target Date 02/02/22               PT Long Term Goals - 03/18/22 1433       PT LONG TERM GOAL #1   Title Improve posture and alignment with patient to demonstrate improved upright posture with posterior shoudler girdle musculature engaged and shoulders level in sitting and standing; Rt UE at side normally    Time 10    Period Weeks    Status Revised    Target Date 05/27/22      PT LONG TERM GOAL #2   Title Increase AROM Rt shoulder to WFL's allowing patient to perform all normal functional activities for ADL's and light household chores    Time 10    Period Weeks    Status Revised    Target Date 05/27/22      PT LONG TERM GOAL #3   Title Increased functional strength Rt UE to 4/5 to 4+/5    Time 10    Period Weeks    Status Revised    Target Date 05/27/22      PT LONG TERM GOAL #4   Title Independent in HEP (including aquatic exercise program as indicated)    Time 10    Period Weeks    Status Revised    Target Date 05/27/22      PT LONG TERM GOAL #5   Title Improve functional limitation score to 31    Time 18    Period Weeks    Status Achieved    Target Date 03/16/22                   Plan - 03/18/22 1321     Clinical Impression Statement Minimal pain in the Rt UE with good gains in the functional activities. Progress with AROM, PROM and strength.    Rehab Potential Good    PT Frequency 1x / week    PT Duration 8 weeks    PT Treatment/Interventions ADLs/Self Care Home  Management;Aquatic Therapy;Cryotherapy;Electrical Stimulation;Iontophoresis 66m/ml Dexamethasone;Moist Heat;Ultrasound;Therapeutic activities;Therapeutic exercise;Neuromuscular re-education;Patient/family education;Manual techniques;Passive range of motion;Dry needling;Taping;Vasopneumatic Device    PT Next Visit Plan patient will work on home exercise for several weeks and check back for additional exercises    PT Home Exercise Plan [email or web address]    Consulted and Agree with Plan of Care Patient             Patient will benefit from skilled therapeutic intervention in order to improve the following deficits and impairments:     Visit Diagnosis: Acute pain of right shoulder  Other symptoms and signs involving the musculoskeletal system  Abnormal posture  Muscle weakness (generalized)     Problem List Patient Active Problem List   Diagnosis Date Noted   Status post reverse total arthroplasty of right shoulder 12/11/2021   Closed fracture dislocation of shoulder, right, initial encounter 10/27/2021   Osteopenia 08/03/2018   Closed intertrochanteric fracture of right hip status post nailing 07/26/2018   Premature atrial contraction 06/29/2018  Type 2 diabetes mellitus without complication, without long-term current use of insulin ([place]) 06/29/2018   Annual physical exam 08/19/2015   Primary osteoarthritis of right knee 08/19/2015   Renal insufficiency 08/15/2014   Panic disorder 05/23/2014   Major depressive disorder, recurrent severe without psychotic features ([place]) 08/06/2013   Hyperlipidemia 07/07/2012   Hypertension 07/05/2012    Kristy Hays Nilda Simmer, PT, MPH  03/18/2022, 2:36 PM  [facility] [place], [place], [address] Phone: [phone]   Fax:  [phone]  Name: Kristy Hays MRN: [id number] Date of Birth: April 30, 1946

## 2022-03-18 NOTE — Patient Instructions (Signed)
Access Code: [email or web address] URL: [email or web address] Date: 03/18/2022 Prepared by: Corlis Leak  Exercises - Seated Cervical Retraction  - 2 x daily - 7 x weekly - 1-2 sets - 5-10 reps - 10 sec  hold - Seated Cervical Sidebending AROM  - 2 x daily - 7 x weekly - 1 sets - 5 reps - 5-10 sec  hold - Seated Cervical Rotation AROM  - 2 x daily - 7 x weekly - 1 sets - 5 reps - 2-3 sec  hold - Standing Scapular Retraction  - 3 x daily - 7 x weekly - 1 sets - 10 reps - 10 sec  hold - Seated Shoulder Flexion AAROM with Pulley Behind  - 2 x daily - 7 x weekly - 1 sets - 10 reps - 10 sec  hold - Isometric Shoulder Abduction at Wall  - 2 x daily - 7 x weekly - 1 sets - 5-10 reps - 5 sec  hold - Isometric Shoulder External Rotation at Wall  - 2 x daily - 7 x weekly - 1 sets - 5 reps - 5 sec  hold - Isometric Shoulder Flexion at Wall  - 2 x daily - 7 x weekly - 1 sets - 5-10 reps - 5 sec  hold - Standing Bilateral Low Shoulder Row with Anchored Resistance  - 1 x daily - 7 x weekly - 1-3 sets - 10 reps - 2-3 sec  hold - Shoulder External Rotation Reactive Isometrics  - 1 x daily - 7 x weekly - 1 sets - 10 reps - 3 sec  hold - Shoulder Internal Rotation Reactive Isometrics  - 1 x daily - 7 x weekly - 1 sets - 10 reps - 3-5 sec  hold - Standing Shoulder and Trunk Flexion at Table  - 2 x daily - 7 x weekly - 1 sets - 5 reps - 5-10 sec  hold - Standing Shoulder Flexion Wall Walk  - 2 x daily - 7 x weekly - 1 sets - 3 reps - 30 sec  hold - Wall Push Up  - 1 x daily - 7 x weekly - 1-3 sets - 10 reps - 3 sec  hold - Standing Shoulder Flexion to 90 Degrees  - 1 x daily - 7 x weekly - 2 sets - 10 reps - 2-3 sec  hold - Standing Shoulder Scaption  - 1 x daily - 7 x weekly - 2 sets - 10 reps - 3 sec  hold - Sidelying Shoulder External Rotation  - 1 x daily - 7 x weekly - 2 sets - 10 reps - 3 sec  hold - Shoulder External Rotation with Anchored Resistance  - 1 x daily - 7 x weekly - 1-2 sets - 10 reps - 3 sec   hold - Shoulder Internal Rotation with Resistance  - 1 x daily - 7 x weekly - 1-2 sets - 10 reps - 3 sec  hold - Shoulder W - External Rotation with Resistance  - 2 x daily - 7 x weekly - 1-2 sets - 10 reps - 3 sec  hold - Standing Shoulder Extension with Dowel  - 1 x daily - 7 x weekly - 1 sets - 5-10 reps - 5-10 sec  hold - Standing Bent Over Single Arm Shoulder Row  - 1 x daily - 7 x weekly - 2-3 sets - 10 reps - 3 sec  hold - Single Arm Bent Over Shoulder Horizontal Abduction with Dumbbell -  Thumb Up  - 1 x daily - 7 x weekly - 2-3 sets - 10 reps - 3 sec  hold - Shoulder External Rotation and Scapular Retraction with Resistance  - 1 x daily - 7 x weekly - 1 sets - 10 reps - 3-5 sec  hold - Standing Shoulder Internal Rotation Stretch with Towel  - 1 x daily - 7 x weekly - 1 sets - 3-5 reps - 15-20 sec  hold

## 2022-04-10 ENCOUNTER — Other Ambulatory Visit: Payer: Self-pay | Admitting: Sports Medicine

## 2022-04-10 DIAGNOSIS — E7800 Pure hypercholesterolemia, unspecified: Secondary | ICD-10-CM

## 2022-04-15 IMAGING — CR DG SHOULDER 1V*R*
1 series · 1 of 1 positions shown · non-contrast
Comparison: Shoulder radiographs 11/10/2021

CLINICAL DATA: Right shoulder arthroplasty

EXAM:
RIGHT SHOULDER - 1 VIEW

[shoulder ap]
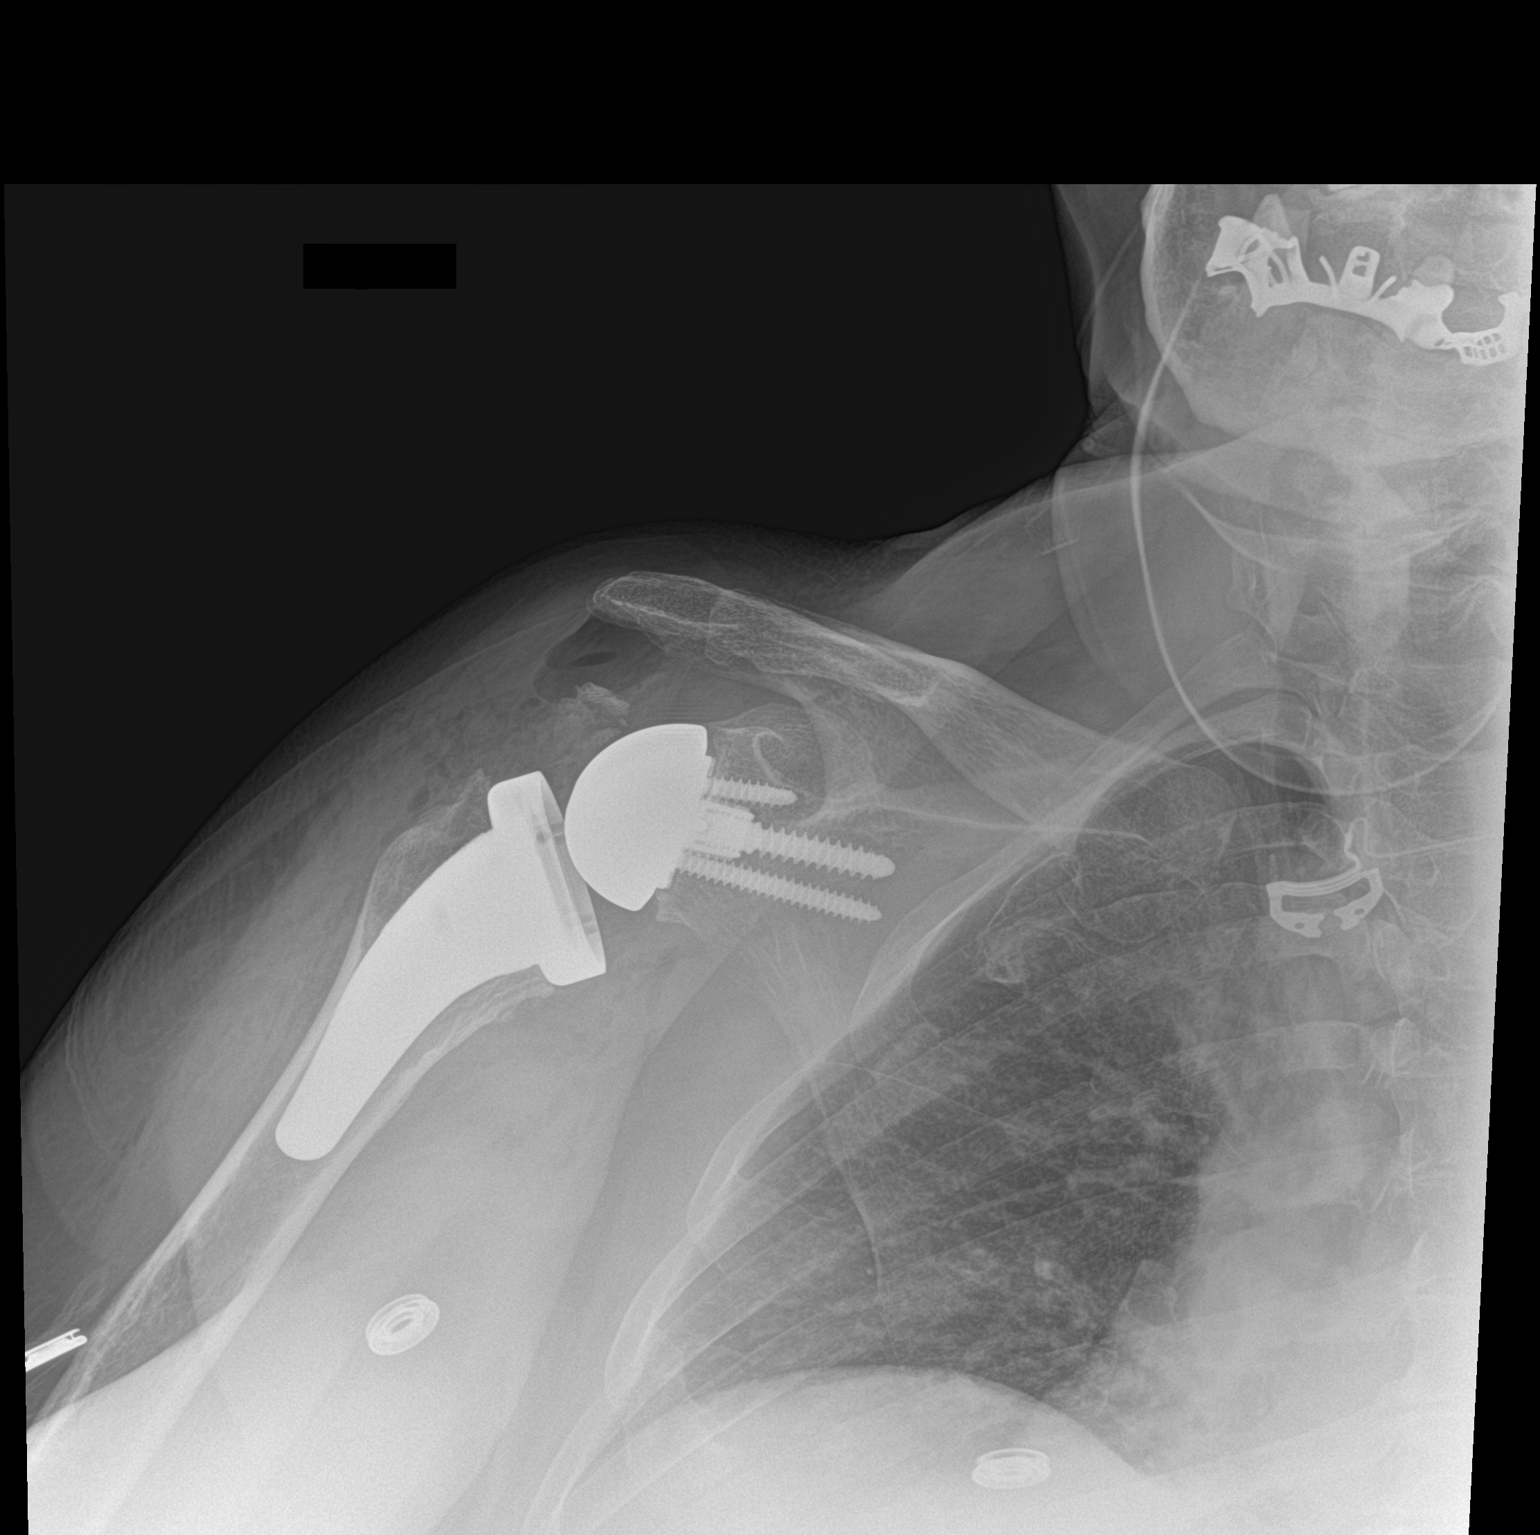

[1 of 1 positions shown; findings below may reference images not displayed]

FINDINGS: The patient is status post reverse shoulder arthroplasty. Hardware
alignment is within expected limits on the single radiograph
provided. There is no evidence of immediate complication. There is
postoperative soft tissue gas around the shoulder joint. The AC
joint is maintained.
IMPRESSION: Status post reverse right shoulder arthroplasty without evidence of
immediate complication.

## 2022-04-15 IMAGING — CR DG CHEST 1V
1 series · 1 of 1 positions shown · non-contrast
Comparison: 08/14/2014

CLINICAL DATA: Status post right shoulder arthroplasty

EXAM:
CHEST  1 VIEW

[chest ap]
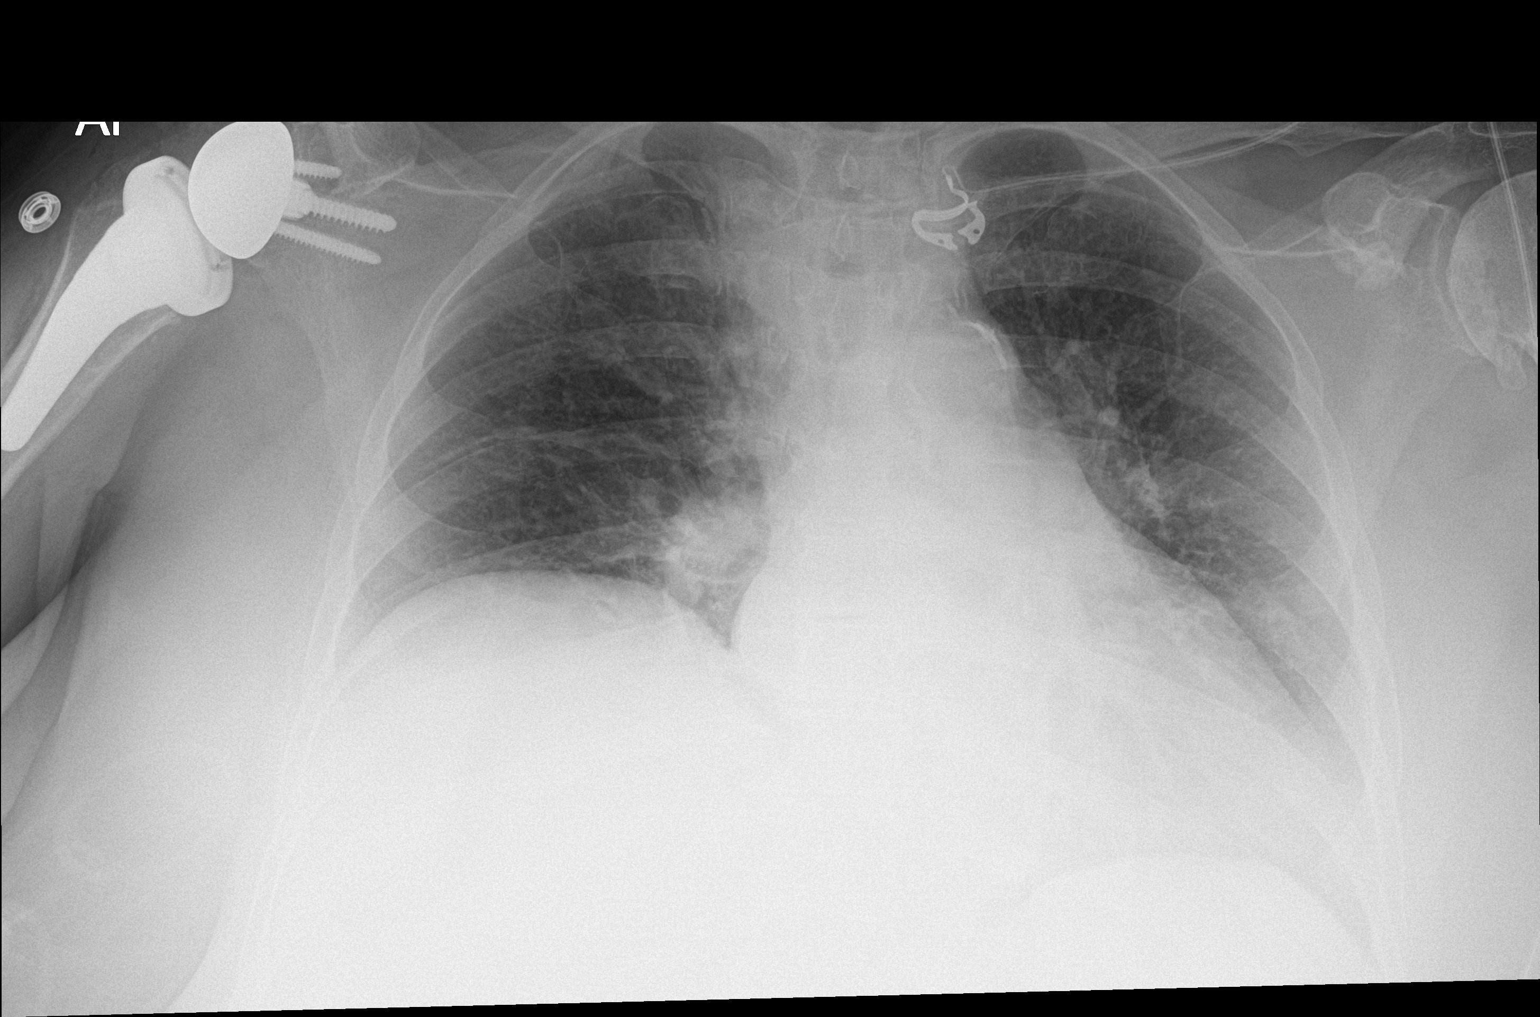

[1 of 1 positions shown; findings below may reference images not displayed]

FINDINGS: Low volume AP portable examination. Cardiomegaly. Diffuse bilateral
interstitial pulmonary opacity. Status post right shoulder reverse
arthroplasty.
IMPRESSION: Low volume AP portable examination. Cardiomegaly with diffuse
bilateral interstitial pulmonary opacity, which may reflect edema,
however appearance is likely exaggerated by low volume AP portable
technique. Consider PA and lateral radiographs.

## 2022-04-21 ENCOUNTER — Encounter: Payer: PRIVATE HEALTH INSURANCE | Admitting: Rehabilitative and Restorative Service Providers"

## 2022-06-04 DIAGNOSIS — S42201D Unspecified fracture of upper end of right humerus, subsequent encounter for fracture with routine healing: Secondary | ICD-10-CM | POA: Diagnosis not present

## 2022-07-09 DIAGNOSIS — M1711 Unilateral primary osteoarthritis, right knee: Secondary | ICD-10-CM | POA: Diagnosis not present

## 2022-07-16 ENCOUNTER — Other Ambulatory Visit: Payer: Self-pay | Admitting: Sports Medicine

## 2022-07-16 DIAGNOSIS — E669 Obesity, unspecified: Secondary | ICD-10-CM | POA: Diagnosis not present

## 2022-07-16 DIAGNOSIS — Z01810 Encounter for preprocedural cardiovascular examination: Secondary | ICD-10-CM | POA: Diagnosis not present

## 2022-07-16 DIAGNOSIS — F411 Generalized anxiety disorder: Secondary | ICD-10-CM | POA: Diagnosis not present

## 2022-07-16 DIAGNOSIS — Z01818 Encounter for other preprocedural examination: Secondary | ICD-10-CM | POA: Diagnosis not present

## 2022-07-16 DIAGNOSIS — I10 Essential (primary) hypertension: Secondary | ICD-10-CM | POA: Diagnosis not present

## 2022-07-16 DIAGNOSIS — Z87891 Personal history of nicotine dependence: Secondary | ICD-10-CM | POA: Diagnosis not present

## 2022-07-16 DIAGNOSIS — E118 Type 2 diabetes mellitus with unspecified complications: Secondary | ICD-10-CM | POA: Diagnosis not present

## 2022-07-16 DIAGNOSIS — E7800 Pure hypercholesterolemia, unspecified: Secondary | ICD-10-CM

## 2022-07-16 DIAGNOSIS — M25561 Pain in right knee: Secondary | ICD-10-CM | POA: Diagnosis not present

## 2022-07-16 DIAGNOSIS — Z01812 Encounter for preprocedural laboratory examination: Secondary | ICD-10-CM | POA: Diagnosis not present

## 2022-07-16 DIAGNOSIS — R7303 Prediabetes: Secondary | ICD-10-CM | POA: Diagnosis not present

## 2022-07-16 DIAGNOSIS — E785 Hyperlipidemia, unspecified: Secondary | ICD-10-CM | POA: Diagnosis not present

## 2022-07-16 DIAGNOSIS — M1711 Unilateral primary osteoarthritis, right knee: Secondary | ICD-10-CM | POA: Diagnosis not present

## 2022-07-18 ENCOUNTER — Other Ambulatory Visit: Payer: Self-pay | Admitting: Sports Medicine

## 2022-07-18 DIAGNOSIS — E7800 Pure hypercholesterolemia, unspecified: Secondary | ICD-10-CM

## 2022-07-20 ENCOUNTER — Encounter: Payer: Self-pay | Admitting: Sports Medicine

## 2022-07-20 DIAGNOSIS — F332 Major depressive disorder, recurrent severe without psychotic features: Secondary | ICD-10-CM

## 2022-07-20 DIAGNOSIS — E7800 Pure hypercholesterolemia, unspecified: Secondary | ICD-10-CM

## 2022-07-20 MED ORDER — FENOFIBRATE 160 MG PO TABS: 160.0000 mg | ORAL_TABLET | Freq: Every day | ORAL | 0 refills | Status: DC

## 2022-07-21 DIAGNOSIS — M1711 Unilateral primary osteoarthritis, right knee: Secondary | ICD-10-CM | POA: Diagnosis not present

## 2022-07-29 DIAGNOSIS — I129 Hypertensive chronic kidney disease with stage 1 through stage 4 chronic kidney disease, or unspecified chronic kidney disease: Secondary | ICD-10-CM | POA: Diagnosis not present

## 2022-07-29 DIAGNOSIS — E785 Hyperlipidemia, unspecified: Secondary | ICD-10-CM | POA: Diagnosis not present

## 2022-07-29 DIAGNOSIS — E1122 Type 2 diabetes mellitus with diabetic chronic kidney disease: Secondary | ICD-10-CM | POA: Diagnosis not present

## 2022-07-29 DIAGNOSIS — G8918 Other acute postprocedural pain: Secondary | ICD-10-CM | POA: Diagnosis not present

## 2022-07-29 DIAGNOSIS — F329 Major depressive disorder, single episode, unspecified: Secondary | ICD-10-CM | POA: Diagnosis not present

## 2022-07-29 DIAGNOSIS — F411 Generalized anxiety disorder: Secondary | ICD-10-CM | POA: Diagnosis not present

## 2022-07-29 DIAGNOSIS — Z888 Allergy status to other drugs, medicaments and biological substances status: Secondary | ICD-10-CM | POA: Diagnosis not present

## 2022-07-29 DIAGNOSIS — Z880 Allergy status to penicillin: Secondary | ICD-10-CM | POA: Diagnosis not present

## 2022-07-29 DIAGNOSIS — N189 Chronic kidney disease, unspecified: Secondary | ICD-10-CM | POA: Diagnosis not present

## 2022-07-29 DIAGNOSIS — M1711 Unilateral primary osteoarthritis, right knee: Secondary | ICD-10-CM | POA: Diagnosis not present

## 2022-07-29 DIAGNOSIS — Z87891 Personal history of nicotine dependence: Secondary | ICD-10-CM | POA: Diagnosis not present

## 2022-07-29 DIAGNOSIS — Z79899 Other long term (current) drug therapy: Secondary | ICD-10-CM | POA: Diagnosis not present

## 2022-08-04 DIAGNOSIS — M1711 Unilateral primary osteoarthritis, right knee: Secondary | ICD-10-CM | POA: Diagnosis not present

## 2022-08-07 ENCOUNTER — Other Ambulatory Visit: Payer: Self-pay | Admitting: Sports Medicine

## 2022-08-07 DIAGNOSIS — E7800 Pure hypercholesterolemia, unspecified: Secondary | ICD-10-CM

## 2022-08-13 ENCOUNTER — Other Ambulatory Visit: Payer: Self-pay | Admitting: Sports Medicine

## 2022-08-13 DIAGNOSIS — M1711 Unilateral primary osteoarthritis, right knee: Secondary | ICD-10-CM | POA: Diagnosis not present

## 2022-08-13 DIAGNOSIS — F332 Major depressive disorder, recurrent severe without psychotic features: Secondary | ICD-10-CM

## 2022-08-13 DIAGNOSIS — Z9889 Other specified postprocedural states: Secondary | ICD-10-CM | POA: Diagnosis not present

## 2022-08-17 ENCOUNTER — Other Ambulatory Visit: Payer: Self-pay | Admitting: Sports Medicine

## 2022-08-17 DIAGNOSIS — M1711 Unilateral primary osteoarthritis, right knee: Secondary | ICD-10-CM | POA: Diagnosis not present

## 2022-08-17 DIAGNOSIS — E7800 Pure hypercholesterolemia, unspecified: Secondary | ICD-10-CM

## 2022-08-17 MED ORDER — PAROXETINE HCL 20 MG PO TABS: 20.0000 mg | ORAL_TABLET | Freq: Every morning | ORAL | 0 refills | Status: DC

## 2022-08-17 MED ORDER — AMLODIPINE BESYLATE 10 MG PO TABS: 10.0000 mg | ORAL_TABLET | Freq: Every day | ORAL | 0 refills | Status: DC

## 2022-08-17 MED ORDER — CARVEDILOL 25 MG PO TABS: 25.0000 mg | ORAL_TABLET | Freq: Two times a day (BID) | ORAL | 0 refills | Status: DC

## 2022-08-17 MED ORDER — EZETIMIBE 10 MG PO TABS: 10.0000 mg | ORAL_TABLET | Freq: Every day | ORAL | 0 refills | Status: DC

## 2022-08-18 ENCOUNTER — Encounter: Payer: Medicare (Managed Care) | Admitting: Sports Medicine

## 2022-08-28 ENCOUNTER — Telehealth: Payer: Self-pay

## 2022-08-28 DIAGNOSIS — E119 Type 2 diabetes mellitus without complications: Secondary | ICD-10-CM

## 2022-08-28 NOTE — Telephone Encounter (Signed)
Patient left VM and states she has a physical with you next Thursday and its a physical cause her appointment is at 3 and she would like to come in one morning next week and to get labs before her appointment please advise.

## 2022-08-28 NOTE — Telephone Encounter (Signed)
Not a problem, order is placed.

## 2022-08-28 NOTE — Addendum Note (Signed)
Addended by: Monica Becton on: 08/28/2022 04:52 PM   Modules accepted: Orders

## 2022-09-02 LAB — COMPLETE METABOLIC PANEL WITH GFR
AG Ratio: 2 (calc) (ref 1.0–2.5)
ALT: 11 U/L (ref 6–29)
AST: 16 U/L (ref 10–35)
Albumin: 4.6 g/dL (ref 3.6–5.1)
Alkaline phosphatase (APISO): 74 U/L (ref 37–153)
BUN/Creatinine Ratio: 23 (calc) — ABNORMAL HIGH (ref 6–22)
BUN: 25 mg/dL (ref 7–25)
CO2: 29 mmol/L (ref 20–32)
Calcium: 10.2 mg/dL (ref 8.6–10.4)
Chloride: 106 mmol/L (ref 98–110)
Creat: 1.09 mg/dL — ABNORMAL HIGH (ref 0.60–1.00)
Globulin: 2.3 g/dL (calc) (ref 1.9–3.7)
Glucose, Bld: 118 mg/dL — ABNORMAL HIGH (ref 65–99)
Potassium: 4.4 mmol/L (ref 3.5–5.3)
Sodium: 144 mmol/L (ref 135–146)
Total Bilirubin: 0.4 mg/dL (ref 0.2–1.2)
Total Protein: 6.9 g/dL (ref 6.1–8.1)
eGFR: 52 mL/min/{1.73_m2} — ABNORMAL LOW (ref >=60)

## 2022-09-02 LAB — CBC
HCT: 36.1 % (ref 35.0–45.0)
Hemoglobin: 11.7 g/dL (ref 11.7–15.5)
MCH: 30 pg (ref 27.0–33.0)
MCHC: 32.4 g/dL (ref 32.0–36.0)
MCV: 92.6 fL (ref 80.0–100.0)
MPV: 10.5 fL (ref 7.5–12.5)
Platelets: 307 10*3/uL (ref 140–400)
RBC: 3.9 10*6/uL (ref 3.80–5.10)
RDW: 12.6 % (ref 11.0–15.0)
WBC: 6.5 10*3/uL (ref 3.8–10.8)

## 2022-09-02 LAB — HEMOGLOBIN A1C
Hgb A1c MFr Bld: 5.9 % of total Hgb — ABNORMAL HIGH (ref <5.7)
Mean Plasma Glucose: 123 mg/dL
eAG (mmol/L): 6.8 mmol/L

## 2022-09-02 LAB — LIPID PANEL
Cholesterol: 193 mg/dL (ref <200)
HDL: 68 mg/dL (ref >=50)
LDL Cholesterol (Calc): 105 mg/dL (calc) — ABNORMAL HIGH
Non-HDL Cholesterol (Calc): 125 mg/dL (calc) (ref <130)
Total CHOL/HDL Ratio: 2.8 (calc) (ref <5.0)
Triglycerides: 103 mg/dL (ref <150)

## 2022-09-02 LAB — TSH: TSH: 1.95 mIU/L (ref 0.40–4.50)

## 2022-09-03 ENCOUNTER — Encounter: Payer: Self-pay | Admitting: Sports Medicine

## 2022-09-03 ENCOUNTER — Ambulatory Visit (INDEPENDENT_AMBULATORY_CARE_PROVIDER_SITE_OTHER): Payer: Medicare (Managed Care) | Admitting: Sports Medicine

## 2022-09-03 VITALS — BP 152/73 | HR 71 | Ht 64.0 in | Wt 177.0 lb

## 2022-09-03 DIAGNOSIS — E7800 Pure hypercholesterolemia, unspecified: Secondary | ICD-10-CM | POA: Diagnosis not present

## 2022-09-03 DIAGNOSIS — Z0000 Encounter for general adult medical examination without abnormal findings: Secondary | ICD-10-CM | POA: Diagnosis not present

## 2022-09-03 DIAGNOSIS — E119 Type 2 diabetes mellitus without complications: Secondary | ICD-10-CM | POA: Diagnosis not present

## 2022-09-03 DIAGNOSIS — Z23 Encounter for immunization: Secondary | ICD-10-CM

## 2022-09-03 NOTE — Addendum Note (Signed)
Addended by: Carren Rang A on: 09/03/2022 03:19 PM   Modules accepted: Orders

## 2022-09-03 NOTE — Assessment & Plan Note (Signed)
Currently on fenofibrate and Zetia, lipids were relatively controlled, she is statin intolerant and Repatha was not covered. She did get some flushing for some reason with Zetia so we will discontinue this, return as needed.

## 2022-09-03 NOTE — Progress Notes (Addendum)
Subjective:    CC: Annual Physical Exam  HPI:  This patient is here for their annual physical  I reviewed the past medical history, family history, social history, surgical history, and allergies today and no changes were needed.  Please see the problem list section below in epic for further details.  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   Procedure: REVERSE SHOULDER ARTHROPLASTY;  Surgeon: Bjorn Pippin, MD;  Location: [facility];  Service: Orthopedics;  Laterality: Right;   Social History: Social History   Socioeconomic History   Marital status: Divorced    Spouse name: Not on file   Number of children: Not on file   Years of education: Not on file   Highest education level: Not on file  Occupational History   Not on file  Tobacco Use   Smoking status: Former   Smokeless tobacco: Never   Tobacco comments:    Quit 24 years ago,  Substance and Sexual Activity   Alcohol use: Not Currently    Comment: Very seldom-5 times a year   Drug use: No   Sexual activity: Not Currently    Partners: Male  Other Topics Concern   Not on file  Social History Narrative   Not on file   Social Determinants of Health   Financial Resource Strain: Not on file  Food Insecurity: Not on file  Transportation Needs: Not on file  Physical Activity: Not on file  Stress: Not on file  Social Connections: Not on file   Family History: Family History  Problem Relation Age of Onset   Cancer Neg Hx    Heart attack Mother    Hypertension Mother    Hypertension Sister    Allergies: Allergies  Allergen Reactions   Ezetimibe Other (See Comments)     Flushing?   Antihistamines, Chlorpheniramine-Type Other (See Comments)    Makes pt Hyper   Benadryl [Diphenhydramine Hcl (Sleep)]     Makes her hyper   Penicillins    Medications: See med rec.  Review of Systems: No headache, visual changes, nausea, vomiting, diarrhea, constipation, dizziness, abdominal pain, skin rash, fevers, chills, night sweats, swollen lymph nodes, weight loss, chest pain, body aches, joint swelling, muscle aches, shortness of breath, mood changes, visual or auditory hallucinations.  Objective:    General: Well Developed, well nourished, and in no acute distress.  Neuro: Alert and oriented x3, extra-ocular muscles intact, sensation grossly intact. Cranial nerves II through XII are intact, motor, sensory, and coordinative functions are all intact. HEENT: Normocephalic, atraumatic, pupils equal round reactive to light, neck supple, no masses, no lymphadenopathy, thyroid nonpalpable. Oropharynx, nasopharynx, external ear canals are unremarkable. Skin: Warm and dry, no rashes noted.  Cardiac: Regular rate and rhythm, no murmurs rubs or gallops.  Respiratory: Clear to auscultation bilaterally. Not using accessory muscles, speaking in full sentences.  Abdominal: Soft, nontender, nondistended, positive bowel sounds, no masses, no organomegaly.  Musculoskeletal: Shoulder, elbow, wrist, hip, knee, ankle stable, and with full range of motion.  Impression and Recommendations:    The patient was counselled, risk factors were discussed, anticipatory  guidance given.  Annual physical exam Annual physical as above, due for microalbumin screening, Tdap, bone density test, we did Tdap today, she will go downstairs and schedule her bone density test, orders in the chart. She could not void for urine microalbuminuria so we will just have her come back in a nurse visit for this.  Hyperlipidemia Currently on fenofibrate and Zetia, lipids were relatively controlled, she is statin  intolerant and Repatha was not covered. She did get some flushing for some reason with Zetia so we will discontinue this, return as needed.   ____________________________________________ [name]. Benjamin Stain, M.D., ABFM., CAQSM., AME. Primary Care and Sports Medicine [facility]  Adjunct Professor of Family Medicine  [place] [employer]  Restaurant manager, fast food

## 2022-09-03 NOTE — Assessment & Plan Note (Addendum)
Annual physical as above, due for microalbumin screening, Tdap, bone density test, we did Tdap today, she will go downstairs and schedule her bone density test, orders in the chart. She could not void for urine microalbuminuria so we will just have her come back in a nurse visit for this.

## 2022-09-08 ENCOUNTER — Ambulatory Visit: Payer: Medicare (Managed Care)

## 2022-09-08 ENCOUNTER — Other Ambulatory Visit: Payer: Self-pay | Admitting: Sports Medicine

## 2022-09-08 DIAGNOSIS — E7800 Pure hypercholesterolemia, unspecified: Secondary | ICD-10-CM

## 2022-09-08 DIAGNOSIS — F332 Major depressive disorder, recurrent severe without psychotic features: Secondary | ICD-10-CM

## 2022-09-08 LAB — POCT UA - MICROALBUMIN
Albumin/Creatinine Ratio, Urine, POC: <30
Creatinine, POC: 300 mg/dL
Microalbumin Ur, POC: 30 mg/L

## 2022-09-08 NOTE — Addendum Note (Signed)
Addended by: Ardyth Man on: 09/08/2022 04:04 PM   Modules accepted: Orders

## 2022-09-11 ENCOUNTER — Telehealth: Payer: Self-pay

## 2022-09-11 DIAGNOSIS — E7800 Pure hypercholesterolemia, unspecified: Secondary | ICD-10-CM

## 2022-09-11 DIAGNOSIS — F332 Major depressive disorder, recurrent severe without psychotic features: Secondary | ICD-10-CM

## 2022-09-11 MED ORDER — CARVEDILOL 25 MG PO TABS: 25.0000 mg | ORAL_TABLET | Freq: Two times a day (BID) | ORAL | 0 refills | Status: DC

## 2022-09-11 MED ORDER — PAROXETINE HCL 20 MG PO TABS: 20.0000 mg | ORAL_TABLET | Freq: Every morning | ORAL | 0 refills | Status: DC

## 2022-09-11 NOTE — Telephone Encounter (Signed)
Med refill

## 2022-09-15 DIAGNOSIS — M1711 Unilateral primary osteoarthritis, right knee: Secondary | ICD-10-CM | POA: Diagnosis not present

## 2022-09-17 DIAGNOSIS — M1711 Unilateral primary osteoarthritis, right knee: Secondary | ICD-10-CM | POA: Diagnosis not present

## 2022-09-23 ENCOUNTER — Ambulatory Visit (INDEPENDENT_AMBULATORY_CARE_PROVIDER_SITE_OTHER): Payer: Medicare HMO

## 2022-09-23 DIAGNOSIS — Z780 Asymptomatic menopausal state: Secondary | ICD-10-CM

## 2022-09-23 DIAGNOSIS — M8580 Other specified disorders of bone density and structure, unspecified site: Secondary | ICD-10-CM

## 2022-09-23 DIAGNOSIS — M8589 Other specified disorders of bone density and structure, multiple sites: Secondary | ICD-10-CM | POA: Diagnosis not present

## 2022-10-08 DIAGNOSIS — H401132 Primary open-angle glaucoma, bilateral, moderate stage: Secondary | ICD-10-CM | POA: Diagnosis not present

## 2022-10-13 ENCOUNTER — Other Ambulatory Visit: Payer: Self-pay | Admitting: Sports Medicine

## 2022-10-13 DIAGNOSIS — E7800 Pure hypercholesterolemia, unspecified: Secondary | ICD-10-CM

## 2022-10-29 DIAGNOSIS — Z96651 Presence of right artificial knee joint: Secondary | ICD-10-CM | POA: Diagnosis not present

## 2022-11-02 ENCOUNTER — Other Ambulatory Visit: Payer: Self-pay | Admitting: Sports Medicine

## 2022-11-02 DIAGNOSIS — F332 Major depressive disorder, recurrent severe without psychotic features: Secondary | ICD-10-CM

## 2022-11-04 ENCOUNTER — Encounter: Payer: Self-pay | Admitting: Sports Medicine

## 2022-11-04 ENCOUNTER — Other Ambulatory Visit: Payer: Self-pay

## 2022-11-04 DIAGNOSIS — E7800 Pure hypercholesterolemia, unspecified: Secondary | ICD-10-CM

## 2022-11-04 DIAGNOSIS — I10 Essential (primary) hypertension: Secondary | ICD-10-CM

## 2022-11-04 DIAGNOSIS — F332 Major depressive disorder, recurrent severe without psychotic features: Secondary | ICD-10-CM

## 2022-11-04 MED ORDER — AMLODIPINE BESYLATE 10 MG PO TABS: 10.0000 mg | ORAL_TABLET | Freq: Every day | ORAL | 2 refills | Status: DC

## 2022-11-04 MED ORDER — CARVEDILOL 25 MG PO TABS: 25.0000 mg | ORAL_TABLET | Freq: Two times a day (BID) | ORAL | 2 refills | Status: DC

## 2022-11-04 MED ORDER — LISINOPRIL-HYDROCHLOROTHIAZIDE 10-12.5 MG PO TABS: 1.0000 | ORAL_TABLET | Freq: Every day | ORAL | 2 refills | Status: DC

## 2022-11-04 MED ORDER — PAROXETINE HCL 20 MG PO TABS: 20.0000 mg | ORAL_TABLET | Freq: Every morning | ORAL | 2 refills | Status: DC

## 2022-11-04 MED ORDER — FENOFIBRATE 160 MG PO TABS: 160.0000 mg | ORAL_TABLET | Freq: Every day | ORAL | 2 refills | Status: DC

## 2022-12-31 DIAGNOSIS — R69 Illness, unspecified: Secondary | ICD-10-CM | POA: Diagnosis not present

## 2023-01-19 ENCOUNTER — Ambulatory Visit: Payer: Self-pay | Admitting: Licensed Clinical Social Worker

## 2023-01-19 ENCOUNTER — Telehealth: Payer: Self-pay | Admitting: General Practice

## 2023-01-19 ENCOUNTER — Encounter: Payer: Self-pay | Admitting: Licensed Clinical Social Worker

## 2023-01-19 NOTE — Patient Instructions (Signed)
Visit Information  Thank you for taking time to visit with me today. Please don't hesitate to contact me if I can be of assistance to you.   Following are the goals we discussed today:   Goals Addressed             This Visit's Progress    Patient may have occasional stress issues. She takes Paxil as prescribed. Occasional anxiety issues       Interventions: Spoke with client about client needs Discussed program support with client. Described program support involving RN, Pharmacist and LCSW Discussed ambulation of client. She is walking well. She exercises often. She goes to [employer] center to exercise Discussed relaxation techniques. She exercises to help her manage anxiety. She takes care of her pet dog to help her relax Discussed medication procurement. Discussed sleeping issues.  Discussed past knee surgery. She had knee surgery in November of 2023. She said she is recovering well from past kne surgery. She said she is walking well Discussed family support. She has support from her daughter, who lives nearby Reviewed pain issues Provided some counseling support Encouraged client to call LCSW as needed for SW support at [phone].        Our next appointment is by telephone on 03/15/23 at 10:00 AM   Please call the care guide team at [phone] if you need to cancel or reschedule your appointment.   If you are experiencing a Mental Health or Behavioral Health Crisis or need someone to talk to, please go to [facility] [address] [place] [phone])   The patient verbalized understanding of instructions, educational materials, and care plan provided today and DECLINED offer to receive copy of patient instructions, educational materials, and care plan.   The patient has been provided with contact information for the care management team and has been advised to call with any health related questions or concerns.    Kelton Pillar.Dechelle Attaway MSW, LCSW Licensed Visual merchandiser [facility] Care Management [phone]

## 2023-01-19 NOTE — Patient Outreach (Signed)
  Care Coordination   Initial Visit Note   01/19/2023 Name: Kristy Hays MRN: [id number] DOB: Mar 27, 1946  Kristy Hays is a 77 y.o. year old female who sees Thekkekandam, [name], MD for primary care. I spoke with  Kristy Hays by phone today.  What matters to the patients health and wellness today? May have occasional stress issues. She takes Paxil as prescribed. Occasional anxiety issues    Goals Addressed             This Visit's Progress    Patient may have occasional stress issues. She takes Paxil as prescribed. Occasional anxiety issues       Interventions: Spoke with client about client needs Discussed program support with client. Described program support involving RN, Pharmacist and LCSW Discussed ambulation of client. She is walking well. She exercises often. She goes to [employer] center to exercise Discussed relaxation techniques. She exercises to help her manage anxiety. She takes care of her pet dog to help her relax Discussed medication procurement. Discussed sleeping issues.  Discussed past knee surgery. She had knee surgery in November of 2023. She said she is recovering well from past kne surgery. She said she is walking well Discussed family support. She has support from her daughter, who lives nearby Reviewed pain issues Provided some counseling support Encouraged client to call LCSW as needed for SW support at [phone].        SDOH assessments and interventions completed:  Yes  SDOH Interventions Today    Flowsheet Row Most Recent Value  SDOH Interventions   Stress Interventions Provide Counseling  [may have occasional stress issues]        Care Coordination Interventions:  Yes, provided   Interventions Today    Flowsheet Row Most Recent Value  Chronic Disease   Chronic disease during today's visit Other  [spoke with Kristy Hays via phone about her current SW needs]  General Interventions   General Interventions Discussed/Reviewed General  Interventions Discussed, [employer] program support with client]  Exercise Interventions   Exercise Discussed/Reviewed Physical Activity  [goes to [employer] [facility] to exercise]  Physical Activity Discussed/Reviewed Physical Activity Discussed  Education Interventions   Education Provided Provided Education  Provided Surveyor, minerals  Mental Health Interventions   Mental Health Discussed/Reviewed Anxiety, Coping Strategies  [discussed relaxation techniques. She likes to exercise to help her relax. she likes ot care for her pet dog]  Nutrition Interventions   Nutrition Discussed/Reviewed Nutrition Discussed  Pharmacy Interventions   Pharmacy Dicussed/Reviewed Pharmacy Topics Discussed        Follow up plan: Follow up call scheduled for 03/15/23 at 10:00 AM    Encounter Outcome:  Pt. Visit Completed   Kristy Hays MSW, LCSW Licensed Visual merchandiser [facility] Care Management [phone]

## 2023-01-19 NOTE — Telephone Encounter (Signed)
Contacted [name] to schedule their annual wellness visit. Appointment made for 03/16/23.  Modesto Charon, RN BSN

## 2023-02-09 DIAGNOSIS — H401132 Primary open-angle glaucoma, bilateral, moderate stage: Secondary | ICD-10-CM | POA: Diagnosis not present

## 2023-03-02 ENCOUNTER — Ambulatory Visit: Payer: Self-pay | Admitting: Licensed Clinical Social Worker

## 2023-03-02 NOTE — Patient Instructions (Signed)
Visit Information  Thank you for taking time to visit with me today. Please don't hesitate to contact me if I can be of assistance to you.   Following are the goals we discussed today:   Goals Addressed             This Visit's Progress    Patient may have occasional stress issues. She takes Paxil as prescribed. Occasional anxiety issues       Interventions: Spoke with client via phone today about client needs Discussed program support with client. Described program support involving RN, Pharmacist and LCSW Discussed ambulation of Quanah Slinger. She exercises regularly by going to [employer].She exercises to help manage anxiety issues. She said she is walking well now Discussed relaxation techniques. She exercises to help her manage anxiety. She takes care of her pet dog to help her relax. She enjoys walking Discussed medication procurement. Discussed sleeping issues.  Discussed family support. Her daughter lives nearby and her daughter is supportive  Reviewed pain issues. Client said she was not having any pain issues Discussed transport issues. She has no transport needs Encouraged [name] to call LCSW as needed for SW support at [phone]. Client was appreciative of call from LCSW today        LCSW to call client as scheduled to assess client needs at that time   Please call the care guide team at [phone] if you need to cancel or reschedule your appointment.   If you are experiencing a Mental Health or Behavioral Health Crisis or need someone to talk to, please go to [facility] [address], [place] [phone])   The patient verbalized understanding of instructions, educational materials, and care plan provided today and DECLINED offer to receive copy of patient instructions, educational materials, and care plan.   The patient has been provided with contact information for the care management team and has been  advised to call with any health related questions or concerns.   Kelton Pillar.Samay Delcarlo MSW, LCSW Licensed Visual merchandiser [facility] Care Management [phone]

## 2023-03-02 NOTE — Patient Outreach (Signed)
  Care Coordination   Follow Up Visit Note   03/02/2023 Name: Kristy Hays MRN: [id number] DOB: Sep 03, 1946  Kristy Hays is a 77 y.o. year old female who sees Thekkekandam, [name], MD for primary care. I spoke with  Kristy Hays by phone today.  What matters to the patients health and wellness today? Patient has anxiety issues. She experiences anxiety and stress occasionally   Interventions: Spoke with client via phone today about client needs Discussed program support with client. Described program support involving RN, Pharmacist and LCSW Discussed ambulation of Kristy Hays. She exercises regularly by going to [employer].She exercises to help manage anxiety issues. She said she is walking well now Discussed relaxation techniques. She exercises to help her manage anxiety. She takes care of her pet dog to help her relax. She enjoys walking Discussed medication procurement. Discussed sleeping issues.  Discussed family support. Her daughter lives nearby and her daughter is supportive  Reviewed pain issues. Client said she was not having any pain issues Discussed transport issues. She has no transport needs Encouraged Kristy Hays to call LCSW as needed for SW support at [phone]. Client was appreciative of call from LCSW today   SDOH assessments and interventions completed:  Yes  SDOH Interventions Today    Flowsheet Row Most Recent Value  SDOH Interventions   Depression Interventions/Treatment  Medication  [Takes Paxil as prescribed]  Stress Interventions Provide Counseling  [client has anxiety and stress issues. She exerises to help manage anxiety. She takes medication as prescribed]        Care Coordination Interventions:  Yes, provided   Interventions Today    Flowsheet Row Most Recent Value  Chronic Disease   Chronic disease during today's visit Other  [spoke with client about client needs]  General Interventions   General Interventions Discussed/Reviewed  General Interventions Discussed, Community Resources  [reviewed program support]  Exercise Interventions   Exercise Discussed/Reviewed Physical Activity  [she likes to walk for exercise]  Physical Activity Discussed/Reviewed Physical Activity Discussed  Education Interventions   Education Provided Provided Education  Provided Verbal Education On Community Resources  Mental Health Interventions   Mental Health Discussed/Reviewed Anxiety, Depression  Kristy Hays has anxiety issues . She exercises to help manage anxiety. she takes Paxil as prescribed]  Nutrition Interventions   Nutrition Discussed/Reviewed Nutrition Discussed  Pharmacy Interventions   Pharmacy Dicussed/Reviewed Pharmacy Topics Discussed        Follow up plan: LCSW to call client as scheduled to assess client needs at that time   Encounter Outcome:  Pt. Visit Completed   Kristy Hays.Kristy Hays MSW, LCSW Licensed Visual merchandiser [facility] Care Management [phone]

## 2023-03-15 ENCOUNTER — Ambulatory Visit: Payer: Self-pay | Admitting: Licensed Clinical Social Worker

## 2023-03-15 NOTE — Patient Instructions (Signed)
Visit Information  Thank you for taking time to visit with me today. Please don't hesitate to contact me if I can be of assistance to you.   Following are the goals we discussed today:   Goals Addressed             This Visit's Progress    Patient may have occasional stress issues. She takes Paxil as prescribed. Occasional anxiety issues       Interventions: Spoke with client via phone today  about her current status and current needs Discussed program support with client. Described program support involving RN, Pharmacist and LCSW Discussed exercise of client. She exercises regularly by going to [employer].She exercises to help manage anxiety issues. She said she is walking well now. She goes to [employer] to exercise several times per week Discussed relaxation techniques. She exercises to help her manage anxiety. She takes care of her pet dog to help her relax. She enjoys walking with her pet dog Discussed medication procurement. Discussed sleeping issues.  Discussed family support. Her daughter lives nearby and her daughter is supportive  Reviewed pain issues. Client said she was not having any pain issues Discussed transport issues. She has no transport needs Used Active Listening skills to hear client needs Client is independent with ADLs and most activities (per client) Encouraged Kristy Hays to call LCSW as needed for SW support at [phone].        Our next appointment is by telephone on 04/19/23 at 10:30 AM   Please call the care guide team at [phone] if you need to cancel or reschedule your appointment.   If you are experiencing a Mental Health or Behavioral Health Crisis or need someone to talk to, please go to [facility] [address] [place] [phone])   The patient verbalized understanding of instructions, educational materials, and care plan provided today and DECLINED offer to receive copy of  patient instructions, educational materials, and care plan.   The patient has been provided with contact information for the care management team and has been advised to call with any health related questions or concerns.   Kelton Pillar.Gittel Mccamish MSW, LCSW Licensed Visual merchandiser [facility] Care Management [phone]

## 2023-03-15 NOTE — Patient Outreach (Signed)
  Care Coordination   Follow Up Visit Note   03/15/2023 Name: Kristy Hays MRN: [id number] DOB: 08-Sep-1946  Kristy Hays is a 77 y.o. year old female who sees Thekkekandam, [name], MD for primary care. I spoke with  Kristy Hays by phone today.  What matters to the patients health and wellness today? May have occasional stress issues. She takes Paxil as prescribed. Occasional anxiety issues    Goals Addressed             This Visit's Progress    Patient may have occasional stress issues. She takes Paxil as prescribed. Occasional anxiety issues       Interventions: Spoke with client via phone today  about her current status and current needs Discussed program support with client. Described program support involving RN, Pharmacist and LCSW Discussed exercise of client. She exercises regularly by going to [employer].She exercises to help manage anxiety issues. She said she is walking well now. She goes to [employer] to exercise several times per week Discussed relaxation techniques. She exercises to help her manage anxiety. She takes care of her pet dog to help her relax. She enjoys walking with her pet dog Discussed medication procurement. Discussed sleeping issues.  Discussed family support. Her daughter lives nearby and her daughter is supportive  Reviewed pain issues. Client said she was not having any pain issues Discussed transport issues. She has no transport needs Used Active Listening skills to hear client needs Client is independent with ADLs and most activities (per client) Encouraged Kristy Hays to call LCSW as needed for SW support at [phone].        SDOH assessments and interventions completed:  Yes  SDOH Interventions Today    Flowsheet Row Most Recent Value  SDOH Interventions   Depression Interventions/Treatment  Counseling  Stress Interventions Provide Counseling  [some stress related to managing medical needs]        Care  Coordination Interventions:  Yes, provided    Interventions Today    Flowsheet Row Most Recent Value  Chronic Disease   Chronic disease during today's visit Other  [spoke with client about client needs]  General Interventions   General Interventions Discussed/Reviewed General Interventions Discussed, Community Resources  [discussed program support]  Exercise Interventions   Exercise Discussed/Reviewed Physical Activity  [client likes to exercise at [employer] [facility]]  Physical Activity Discussed/Reviewed Physical Activity Discussed  [she walks for exercise. Goes to [employer] to exercise]  Education Interventions   Education Provided Provided Education  Provided Verbal Education On [employer]  Mental Health Interventions   Mental Health Discussed/Reviewed Anxiety, Coping Strategies  [client feels that her mood is stable. She did not mention any mood issues]  Nutrition Interventions   Nutrition Discussed/Reviewed Nutrition Discussed  Pharmacy Interventions   Pharmacy Dicussed/Reviewed Pharmacy Topics Discussed       Follow up plan: Follow up call scheduled for 04/19/23 at 10:30 AM     Encounter Outcome:  Pt. Visit Completed   Kristy Hays.Kristy Hays MSW, LCSW Licensed Visual merchandiser [facility] Care Management [phone]

## 2023-04-19 ENCOUNTER — Ambulatory Visit: Payer: Self-pay | Admitting: Licensed Clinical Social Worker

## 2023-04-19 NOTE — Patient Outreach (Signed)
  Care Coordination   04/19/2023 Name: Chairty Rumberger MRN: [id number] DOB: Mar 30, 1946   Care Coordination Outreach Attempts:  An unsuccessful telephone outreach was attempted today to offer the patient information about available care coordination services.  Follow Up Plan:  Additional outreach attempts will be made to offer the patient care coordination information and services.   Encounter Outcome:  No Answer   Care Coordination Interventions:  No, not indicated    Kelton Pillar. MSW, LCSW Licensed Visual merchandiser [facility] Care Management [phone]

## 2023-04-20 ENCOUNTER — Ambulatory Visit: Payer: Self-pay | Admitting: Licensed Clinical Social Worker

## 2023-04-20 ENCOUNTER — Telehealth: Payer: Self-pay | Admitting: *Deleted

## 2023-04-20 ENCOUNTER — Encounter: Payer: Self-pay | Admitting: *Deleted

## 2023-04-20 NOTE — Patient Outreach (Signed)
  Care Coordination   Follow Up Visit Note   04/20/2023 Name: Kristy Hays MRN: [id number] DOB: 1946/02/18  Kristy Hays is a 77 y.o. year old female who sees Thekkekandam, [name], MD for primary care. I spoke with  [name] by phone today.  What matters to the patients health and wellness today? She may have occasional stress issues. She takes Paxil as prescribed She has occasional anxiety issues    Goals Addressed             This Visit's Progress    Patient may have occasional stress issues. She takes Paxil as prescribed. Occasional anxiety issues       Interventions: Spoke with client via phone today about her current needs and issues of concern Discussed program support with client. Described program support involving RN, Pharmacist and LCSW Discussed exercise of client. She exercises regularly by going to [employer].She exercises to help manage anxiety issues. She said she is walking well now.  She did have a knee procedure last November but said her walking has improved now Client takes care of her pet dog to help her relax. She enjoys walking with her pet dog Discussed medication procurement. .  Used Active Listening skills to hear client needs Client is independent with ADLs and most activities (per client). She has support of her daughter and client feels that she is in a good mood usually Encouraged [name] to call LCSW as needed for SW support at [phone].        SDOH assessments and interventions completed:  Yes  SDOH Interventions Today    Flowsheet Row Most Recent Value  SDOH Interventions   Depression Interventions/Treatment  Counseling  Stress Interventions Provide Counseling  [client has some stress related to managing medical needs]        Care Coordination Interventions:  Yes, provided  Interventions Today    Flowsheet Row Most Recent Value  Chronic Disease   Chronic disease during today's visit Other  [spoke with client about  client needs]  General Interventions   General Interventions Discussed/Reviewed General Interventions Discussed, Community Resources  Exercise Interventions   Exercise Discussed/Reviewed Physical Activity  [client likes to walk for exercise]  Education Interventions   Education Provided Provided Education  Provided Verbal Education On [employer]  [discussed client going to [employer] weekly]  Mental Health Interventions   Mental Health Discussed/Reviewed Coping Strategies  [no mood issues mentioned]  Nutrition Interventions   Nutrition Discussed/Reviewed Nutrition Discussed  Pharmacy Interventions   Pharmacy Dicussed/Reviewed Pharmacy Topics Discussed        Follow up plan: Follow up call scheduled for 06/14/23 at 10:00 AM    Encounter Outcome:  Pt. Visit Completed   Kelton Pillar. MSW, LCSW Licensed Visual merchandiser [facility] Care Management [phone]

## 2023-04-20 NOTE — Telephone Encounter (Signed)
I attempted to contact patient by telephone but was unsuccessful. According to the patient's chart they are due for follow up with med center [facility]. I have left a HIPAA compliant message advising the patient to contact med center [facility] at [phone]. I will continue to follow up with the patient to make sure this appointment is scheduled.

## 2023-04-20 NOTE — Patient Instructions (Signed)
Visit Information  Thank you for taking time to visit with me today. Please don't hesitate to contact me if I can be of assistance to you.   Following are the goals we discussed today:   Goals Addressed             This Visit's Progress    Patient may have occasional stress issues. She takes Paxil as prescribed. Occasional anxiety issues       Interventions: Spoke with client via phone today about her current needs and issues of concern Discussed program support with client. Described program support involving RN, Pharmacist and LCSW Discussed exercise of client. She exercises regularly by going to [employer].She exercises to help manage anxiety issues. She said she is walking well now.  She did have a knee procedure last November but said her walking has improved now Client takes care of her pet dog to help her relax. She enjoys walking with her pet dog Discussed medication procurement. .  Used Active Listening skills to hear client needs Client is independent with ADLs and most activities (per client). She has support of her daughter and client feels that she is in a good mood usually Encouraged Zahrya Kaaihue to call LCSW as needed for SW support at [phone].        Our next appointment is by telephone on 06/14/23 at 10:00 AM   Please call the care guide team at [phone] if you need to cancel or reschedule your appointment.   If you are experiencing a Mental Health or Behavioral Health Crisis or need someone to talk to, please go to [facility] [address] [place] [phone])   The patient verbalized understanding of instructions, educational materials, and care plan provided today and DECLINED offer to receive copy of patient instructions, educational materials, and care plan.   The patient has been provided with contact information for the care management team and has been advised to call with any health related  questions or concerns.   Kelton Pillar. MSW, LCSW Licensed Visual merchandiser [facility] Care Management [phone]

## 2023-06-14 ENCOUNTER — Ambulatory Visit: Payer: Self-pay | Admitting: Licensed Clinical Social Worker

## 2023-06-14 NOTE — Patient Outreach (Signed)
Care Coordination   06/14/2023 Name: Kristy Hays MRN: [id number] DOB: May 20, 1946   Care Coordination Outreach Attempts:  An unsuccessful telephone outreach was attempted today to offer the patient information about available care coordination services.  Follow Up Plan:  Additional outreach attempts will be made to offer the patient care coordination information and services.   Encounter Outcome:  No Answer   Care Coordination Interventions:  No, not indicated     Kelton Pillar.Ashani Pumphrey MSW, LCSW Licensed Visual merchandiser [facility] Care Management [phone]

## 2023-06-30 ENCOUNTER — Telehealth: Payer: Self-pay | Admitting: *Deleted

## 2023-06-30 NOTE — Progress Notes (Signed)
Care Coordination Note  06/30/2023 Name: Zerelda Ptacek MRN: [id number] DOB: [birth year]  Carylon Rosenberg is a 77 y.o. year old female who is a primary care patient of Benjamin Stain, [name], MD and is actively engaged with the care management team. I reached out to [name] by phone today to assist with re-scheduling a follow up visit with the Licensed Clinical Social Worker  Follow up plan: Patient declines further follow up and engagement by the care management team. Appropriate care team members and provider have been notified via electronic communication.   [facility]  Care Coordination Care Guide  Direct Dial: [phone]

## 2023-06-30 NOTE — Progress Notes (Signed)
Care Coordination Note  06/30/2023 Name: Delanda Rempfer MRN: [id number] DOB: [birth year]  Dorice Fleischmann is a 77 y.o. year old female who is a primary care patient of Benjamin Stain, [name], MD and is actively engaged with the care management team. I reached out to [name] by phone today to assist with re-scheduling a follow up visit with the Licensed Clinical Social Worker  Follow up plan: Unsuccessful telephone outreach attempt made. A HIPAA compliant phone message was left for the patient providing contact information and requesting a return call.   [facility]  Care Coordination Care Guide  Direct Dial: [phone]

## 2023-07-21 DIAGNOSIS — Z96651 Presence of right artificial knee joint: Secondary | ICD-10-CM | POA: Diagnosis not present

## 2023-08-16 ENCOUNTER — Telehealth: Payer: Self-pay | Admitting: Sports Medicine

## 2023-08-16 DIAGNOSIS — E119 Type 2 diabetes mellitus without complications: Secondary | ICD-10-CM

## 2023-08-16 NOTE — Telephone Encounter (Signed)
error 

## 2023-08-16 NOTE — Telephone Encounter (Signed)
Labs ordered.

## 2023-08-16 NOTE — Telephone Encounter (Signed)
Patient would like to have lab work done before appointment.   Copied from CRM [id number]. Topic: Clinical - Request for Lab/Test Order >> Aug 16, 2023  1:42 PM Kristy Hays wrote:  Reason for CRM: Patient is requesting to visit lab before the visit with provider at 2pm; She needed to know if orders would be sent in before the visit or would she have to stop in and pick up the orders; patient ask that you call and notify her of what she needs to do

## 2023-08-17 NOTE — Telephone Encounter (Signed)
Task completed. Patient has been updated that lab order has been placed to complete prior to her appointment on 08/19/23.

## 2023-08-18 DIAGNOSIS — E119 Type 2 diabetes mellitus without complications: Secondary | ICD-10-CM | POA: Diagnosis not present

## 2023-08-19 ENCOUNTER — Encounter: Payer: Self-pay | Admitting: Sports Medicine

## 2023-08-19 ENCOUNTER — Ambulatory Visit (INDEPENDENT_AMBULATORY_CARE_PROVIDER_SITE_OTHER): Payer: Medicare HMO | Admitting: Sports Medicine

## 2023-08-19 VITALS — BP 126/69 | HR 77 | Ht 64.0 in | Wt 172.0 lb

## 2023-08-19 DIAGNOSIS — E7800 Pure hypercholesterolemia, unspecified: Secondary | ICD-10-CM | POA: Diagnosis not present

## 2023-08-19 DIAGNOSIS — Z0000 Encounter for general adult medical examination without abnormal findings: Secondary | ICD-10-CM

## 2023-08-19 DIAGNOSIS — E119 Type 2 diabetes mellitus without complications: Secondary | ICD-10-CM

## 2023-08-19 LAB — COMPREHENSIVE METABOLIC PANEL
ALT: 15 [IU]/L (ref 0–32)
AST: 24 [IU]/L (ref 0–40)
Albumin: 4.3 g/dL (ref 3.8–4.8)
Alkaline Phosphatase: 71 [IU]/L (ref 44–121)
BUN/Creatinine Ratio: 17 (ref 12–28)
BUN: 18 mg/dL (ref 8–27)
Bilirubin Total: 0.4 mg/dL (ref 0.0–1.2)
CO2: 23 mmol/L (ref 20–29)
Calcium: 9.7 mg/dL (ref 8.7–10.3)
Chloride: 104 mmol/L (ref 96–106)
Creatinine, Ser: 1.06 mg/dL — ABNORMAL HIGH (ref 0.57–1.00)
Globulin, Total: 2.5 g/dL (ref 1.5–4.5)
Glucose: 105 mg/dL — ABNORMAL HIGH (ref 70–99)
Potassium: 4.2 mmol/L (ref 3.5–5.2)
Sodium: 143 mmol/L (ref 134–144)
Total Protein: 6.8 g/dL (ref 6.0–8.5)
eGFR: 54 mL/min/{1.73_m2} — ABNORMAL LOW (ref >59)

## 2023-08-19 LAB — CBC
Hematocrit: 41.1 % (ref 34.0–46.6)
Hemoglobin: 13.4 g/dL (ref 11.1–15.9)
MCH: 29.8 pg (ref 26.6–33.0)
MCHC: 32.6 g/dL (ref 31.5–35.7)
MCV: 91 fL (ref 79–97)
Platelets: 310 10*3/uL (ref 150–450)
RBC: 4.5 x10E6/uL (ref 3.77–5.28)
RDW: 12 % (ref 11.7–15.4)
WBC: 4.5 10*3/uL (ref 3.4–10.8)

## 2023-08-19 LAB — HEMOGLOBIN A1C
Est. average glucose Bld gHb Est-mCnc: 126 mg/dL
Hgb A1c MFr Bld: 6 % — ABNORMAL HIGH (ref 4.8–5.6)

## 2023-08-19 LAB — LIPID PANEL
Chol/HDL Ratio: 3.3 {ratio} (ref 0.0–4.4)
Cholesterol, Total: 211 mg/dL — ABNORMAL HIGH (ref 100–199)
HDL: 63 mg/dL (ref >39)
LDL Chol Calc (NIH): 133 mg/dL — ABNORMAL HIGH (ref 0–99)
Triglycerides: 87 mg/dL (ref 0–149)
VLDL Cholesterol Cal: 15 mg/dL (ref 5–40)

## 2023-08-19 LAB — MICROALBUMIN / CREATININE URINE RATIO
Creatinine, Urine: 306.7 mg/dL
Microalb/Creat Ratio: 16 mg/g{creat} (ref 0–29)
Microalbumin, Urine: 48.1 ug/mL

## 2023-08-19 LAB — TSH: TSH: 1.72 u[IU]/mL (ref 0.450–4.500)

## 2023-08-19 MED ORDER — ROSUVASTATIN CALCIUM 5 MG PO TABS: 5.0000 mg | ORAL_TABLET | Freq: Every day | ORAL | 3 refills | Status: DC

## 2023-08-19 NOTE — Progress Notes (Addendum)
 Subjective:    CC: Annual Physical Exam  HPI:  This patient is here for their annual physical  I reviewed the past medical history, family history, social history, surgical history, and allergies today and no changes were needed.  Please see the problem list section below in epic for further details.  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   Procedure: REVERSE SHOULDER ARTHROPLASTY;  Surgeon: Bjorn Pippin, MD;  Location: [facility];  Service: Orthopedics;  Laterality: Right;   Social History: Social History   Socioeconomic History   Marital status: Divorced    Spouse name: Not on file   Number of children: Not on file   Years of education: Not on file   Highest education level: Associate degree: occupational, Scientist, product/process development, or vocational program  Occupational History   Not on file  Tobacco Use   Smoking status: Former   Smokeless tobacco: Never   Tobacco comments:    Quit 24 years ago,  Substance and Sexual Activity   Alcohol use: Not Currently    Comment: Very seldom-5 times a year   Drug use: No   Sexual activity: Not Currently    Partners: Male  Other Topics Concern   Not on file  Social History Narrative   Not on file   Social Drivers of Health   Financial Resource Strain: Low Risk  (08/15/2023)   Overall Financial Resource Strain (CARDIA)    Difficulty of Paying Living Expenses: Not hard at all  Food Insecurity: No Food Insecurity (08/15/2023)   Hunger Vital Sign    Worried About Running Out of Food in the Last Year: Never true    Ran Out of Food in the Last Year: Never true  Transportation Needs: No Transportation  Needs (08/15/2023)   PRAPARE - Administrator, Civil Service (Medical): No    Lack of Transportation (Non-Medical): No  Physical Activity: Sufficiently Active (08/15/2023)   Exercise Vital Sign    Days of Exercise per Week: 7 days    Minutes of Exercise per Session: 30 min  Stress: No Stress Concern Present (08/15/2023)   [employer] of Occupational Health - Occupational Stress Questionnaire    Feeling of Stress : Not at all  Social Connections: Moderately Isolated (08/15/2023)   Social Connection and Isolation Panel [NHANES]    Frequency of Communication with Friends and Family: More than three times a week    Frequency of Social Gatherings with Friends and Family: Twice a week    Attends Religious Services: Never    Database administrator or Organizations: Yes    Attends Engineer, structural: More than 4 times per year    Marital Status: Divorced   Family History: Family History  Problem Relation Age of Onset   Cancer Neg Hx    Heart attack Mother    Hypertension Mother    Hypertension Sister    Allergies: Allergies  Allergen Reactions   Ezetimibe Other (See Comments)    Flushing?   Antihistamines, Chlorpheniramine-Type Other (See Comments)    Makes pt Hyper   Benadryl [Diphenhydramine  Hcl (Sleep)]     Makes her hyper   Penicillins    Medications: See med rec.  Review of Systems: No headache, visual changes, nausea, vomiting, diarrhea, constipation, dizziness, abdominal pain, skin rash, fevers, chills, night sweats, swollen lymph nodes, weight loss, chest pain, body aches, joint swelling, muscle aches, shortness of breath, mood changes, visual or auditory hallucinations.  Objective:    General: Well Developed, well nourished, and in no acute distress.  Neuro: Alert and oriented x3, extra-ocular muscles intact, sensation grossly intact. Cranial nerves II through XII are intact, motor, sensory, and coordinative functions are all intact. HEENT:  Normocephalic, atraumatic, pupils equal round reactive to light, neck supple, no masses, no lymphadenopathy, thyroid nonpalpable. Oropharynx, nasopharynx, external ear canals are unremarkable. Skin: Warm and dry, no rashes noted.  Cardiac: Regular rate and rhythm, no murmurs rubs or gallops.  Respiratory: Clear to auscultation bilaterally. Not using accessory muscles, speaking in full sentences.  Abdominal: Soft, nontender, nondistended, positive bowel sounds, no masses, no organomegaly.  Musculoskeletal: Shoulder, elbow, wrist, hip, knee, ankle stable, and with full range of motion.  Impression and Recommendations:    The patient was counselled, risk factors were discussed, anticipatory guidance given.  Annual physical exam Annual physical exam as above, labs look good except for cholesterol could be a little lower. Return to see me in 1 year.  Hyperlipidemia Historically has been statin intolerant, we will try low-dose Crestor with a 3-month lipid recheck. Of note fenofibrate, Zetia, did not control lipids. Repatha was not covered.  Lipids still elevated, increasing crestor to 20, recheck lipids in 3 months.  Type 2 diabetes mellitus without complication, without long-term current use of insulin (HCC) Adding a1c  ____________________________________________ Kristy Hays. Benjamin Stain, M.D., ABFM., CAQSM., AME. Primary Care and Sports Medicine [facility]  Adjunct Professor of Family Medicine  [employer]  Restaurant manager, fast food

## 2023-08-19 NOTE — Assessment & Plan Note (Signed)
Annual physical exam as above, labs look good except for cholesterol could be a little lower. Return to see me in 1 year.

## 2023-08-19 NOTE — Assessment & Plan Note (Addendum)
 Historically has been statin intolerant, we will try low-dose Crestor with a 29-month lipid recheck. Of note fenofibrate, Zetia, did not control lipids. Repatha was not covered.  Lipids still elevated, increasing crestor to 20, recheck lipids in 3 months.

## 2023-08-23 ENCOUNTER — Other Ambulatory Visit: Payer: Self-pay | Admitting: Sports Medicine

## 2023-08-25 DIAGNOSIS — H5213 Myopia, bilateral: Secondary | ICD-10-CM | POA: Diagnosis not present

## 2023-09-14 ENCOUNTER — Other Ambulatory Visit: Payer: Self-pay | Admitting: Sports Medicine

## 2023-09-14 DIAGNOSIS — I10 Essential (primary) hypertension: Secondary | ICD-10-CM

## 2023-09-14 DIAGNOSIS — F332 Major depressive disorder, recurrent severe without psychotic features: Secondary | ICD-10-CM

## 2023-10-21 ENCOUNTER — Other Ambulatory Visit: Payer: Self-pay | Admitting: Sports Medicine

## 2023-10-21 DIAGNOSIS — E7800 Pure hypercholesterolemia, unspecified: Secondary | ICD-10-CM

## 2023-12-03 ENCOUNTER — Other Ambulatory Visit: Payer: Self-pay | Admitting: Sports Medicine

## 2023-12-03 DIAGNOSIS — F332 Major depressive disorder, recurrent severe without psychotic features: Secondary | ICD-10-CM

## 2023-12-03 DIAGNOSIS — E7800 Pure hypercholesterolemia, unspecified: Secondary | ICD-10-CM | POA: Diagnosis not present

## 2023-12-04 LAB — COMPREHENSIVE METABOLIC PANEL
ALT: 17 IU/L (ref 0–32)
AST: 18 IU/L (ref 0–40)
Albumin: 4.3 g/dL (ref 3.8–4.8)
Alkaline Phosphatase: 100 IU/L (ref 44–121)
BUN/Creatinine Ratio: 21 (ref 12–28)
BUN: 19 mg/dL (ref 8–27)
Bilirubin Total: 0.4 mg/dL (ref 0.0–1.2)
CO2: 22 mmol/L (ref 20–29)
Calcium: 9.4 mg/dL (ref 8.7–10.3)
Chloride: 105 mmol/L (ref 96–106)
Creatinine, Ser: 0.89 mg/dL (ref 0.57–1.00)
Globulin, Total: 2.3 g/dL (ref 1.5–4.5)
Glucose: 135 mg/dL — ABNORMAL HIGH (ref 70–99)
Potassium: 4.1 mmol/L (ref 3.5–5.2)
Sodium: 142 mmol/L (ref 134–144)
Total Protein: 6.6 g/dL (ref 6.0–8.5)
eGFR: 66 mL/min/{1.73_m2} (ref >59)

## 2023-12-04 LAB — LIPID PANEL
Chol/HDL Ratio: 2.8 ratio (ref 0.0–4.4)
Cholesterol, Total: 209 mg/dL — ABNORMAL HIGH (ref 100–199)
HDL: 75 mg/dL (ref >39)
LDL Chol Calc (NIH): 120 mg/dL — ABNORMAL HIGH (ref 0–99)
Triglycerides: 79 mg/dL (ref 0–149)
VLDL Cholesterol Cal: 14 mg/dL (ref 5–40)

## 2023-12-04 MED ORDER — ROSUVASTATIN CALCIUM 10 MG PO TABS: 10.0000 mg | ORAL_TABLET | Freq: Every day | ORAL | 3 refills | Status: DC

## 2023-12-04 NOTE — Addendum Note (Signed)
 Addended by: Monica Becton on: 12/04/2023 08:40 AM   Modules accepted: Orders

## 2023-12-04 NOTE — Assessment & Plan Note (Signed)
Adding a1c ?

## 2023-12-07 ENCOUNTER — Ambulatory Visit: Payer: Self-pay

## 2023-12-07 NOTE — Telephone Encounter (Signed)
 Reason for Disposition . Caller requesting lab results  (Exception: Routine or non-urgent lab result.)  Answer Assessment - Initial Assessment Questions 1. REASON FOR CALL or QUESTION: "What is your reason for calling today?" or "How can I best help you?" or "What question do you have that I can help answer?"     Please see notes 2. CALLER: Document the source of call. (e.g., laboratory, patient).     Patient  Protocols used: PCP Call - No Triage-A-AH

## 2023-12-07 NOTE — Telephone Encounter (Signed)
 Returned patient's call, patient had question about if she had to come in for more blood draw due to provider adding A1C. Informed patient A1C is in processing and note states redraw lipids in 3 months. Patient is asking if she will need to come pick up the lab form from the office in 3 months or will it be sent to the lab? Patient would like call back with further instructions.

## 2023-12-07 NOTE — Telephone Encounter (Signed)
 First attempt to reach pt, left VM.   Copied from CRM [id number]. Topic: Clinical - Lab/Test Results >> Dec 07, 2023 12:11 PM Clayton Bibles wrote: Reason for CRM:  Kristy Hays has lab results questions. >> Dec 07, 2023 12:16 PM Clayton Bibles wrote: She wants more information about this message: Patient made aware of results/recommendations.  A1c  was added by provider  eta is 24/48  turn around Does she need to come back in for more blood work?? She wants to discuss her high blood sugar. Please call her at [phone]. Thanks

## 2023-12-09 NOTE — Telephone Encounter (Signed)
 Task completed. Patient was informed no appt needed for A1c labs.

## 2023-12-13 DIAGNOSIS — E119 Type 2 diabetes mellitus without complications: Secondary | ICD-10-CM | POA: Diagnosis not present

## 2023-12-14 LAB — HEMOGLOBIN A1C
Est. average glucose Bld gHb Est-mCnc: 128 mg/dL
Hgb A1c MFr Bld: 6.1 % — ABNORMAL HIGH (ref 4.8–5.6)

## 2023-12-27 ENCOUNTER — Other Ambulatory Visit: Payer: Self-pay | Admitting: Sports Medicine

## 2023-12-27 DIAGNOSIS — E7800 Pure hypercholesterolemia, unspecified: Secondary | ICD-10-CM

## 2024-01-04 ENCOUNTER — Encounter (INDEPENDENT_AMBULATORY_CARE_PROVIDER_SITE_OTHER): Payer: Self-pay | Admitting: Sports Medicine

## 2024-01-04 DIAGNOSIS — E7800 Pure hypercholesterolemia, unspecified: Secondary | ICD-10-CM

## 2024-01-05 MED ORDER — CO Q-10 200 MG PO CAPS: 200.0000 mg | ORAL_CAPSULE | Freq: Every day | ORAL | 3 refills | Status: AC

## 2024-01-05 NOTE — Telephone Encounter (Signed)

## 2024-01-12 ENCOUNTER — Other Ambulatory Visit: Payer: Self-pay | Admitting: Sports Medicine

## 2024-01-12 DIAGNOSIS — F332 Major depressive disorder, recurrent severe without psychotic features: Secondary | ICD-10-CM

## 2024-02-24 DIAGNOSIS — H401132 Primary open-angle glaucoma, bilateral, moderate stage: Secondary | ICD-10-CM | POA: Diagnosis not present

## 2024-05-16 ENCOUNTER — Encounter: Payer: Self-pay | Admitting: Sports Medicine

## 2024-05-24 ENCOUNTER — Other Ambulatory Visit: Payer: Self-pay

## 2024-05-24 MED ORDER — AMLODIPINE BESYLATE 10 MG PO TABS: 10.0000 mg | ORAL_TABLET | Freq: Every day | ORAL | 0 refills | Status: DC

## 2024-06-23 ENCOUNTER — Telehealth: Payer: Self-pay | Admitting: Sports Medicine

## 2024-06-23 ENCOUNTER — Other Ambulatory Visit: Payer: Self-pay

## 2024-06-23 DIAGNOSIS — F332 Major depressive disorder, recurrent severe without psychotic features: Secondary | ICD-10-CM

## 2024-06-23 MED ORDER — PAROXETINE HCL 20 MG PO TABS: 20.0000 mg | ORAL_TABLET | Freq: Every day | ORAL | 0 refills | Status: DC

## 2024-06-23 NOTE — Telephone Encounter (Signed)
 Called patient to schedule [facility] appointment with Benton Gave. No answer, LVM

## 2024-07-06 ENCOUNTER — Other Ambulatory Visit: Payer: Self-pay | Admitting: Family Medicine

## 2024-07-06 ENCOUNTER — Other Ambulatory Visit: Payer: Self-pay | Admitting: Urgent Care

## 2024-07-06 DIAGNOSIS — I10 Essential (primary) hypertension: Secondary | ICD-10-CM

## 2024-07-06 NOTE — Telephone Encounter (Signed)
 Copied from CRM #[id number]. Topic: Clinical - Medication Refill >> Jul 06, 2024  2:03 PM Lauren C wrote: Medication: lisinopril -hydrochlorothiazide  (ZESTORETIC ) 10-12.5 MG tablet  Has the patient contacted their pharmacy? Yes Refill request received. She was asked to schedule TOC appt. Appt was scheduled.  This is the patient's preferred pharmacy:  [facility] [place], [place] - [address] [place] [address] Phone: [phone] Fax: [phone]  Is this the correct pharmacy for this prescription? Yes If no, delete pharmacy and type the correct one.   Has the prescription been filled recently? Yes  Is the patient out of the medication? Yes  Has the patient been seen for an appointment in the last year OR does the patient have an upcoming appointment? Yes  Can we respond through MyChart? No, [id number]  Agent: Please be advised that Rx refills may take up to 3 business days. We ask that you follow-up with your pharmacy.

## 2024-07-10 ENCOUNTER — Telehealth: Payer: Self-pay | Admitting: Urgent Care

## 2024-07-10 ENCOUNTER — Other Ambulatory Visit: Payer: Self-pay

## 2024-07-10 DIAGNOSIS — I10 Essential (primary) hypertension: Secondary | ICD-10-CM

## 2024-07-10 MED ORDER — LISINOPRIL-HYDROCHLOROTHIAZIDE 10-12.5 MG PO TABS: 1.0000 | ORAL_TABLET | Freq: Every day | ORAL | 0 refills | Status: DC

## 2024-07-10 NOTE — Telephone Encounter (Signed)
 Copied from CRM #[id number]. Topic: Clinical - Medication Refill >> Jul 06, 2024  2:03 PM Lauren C wrote: Medication: lisinopril -hydrochlorothiazide  (ZESTORETIC ) 10-12.5 MG tablet  Has the patient contacted their pharmacy? Yes Refill request received. She was asked to schedule TOC appt. Appt was scheduled.  This is the patient's preferred pharmacy:  [facility] [place], [place] - [address] [place] [address] Phone: [phone] Fax: [phone]  Is this the correct pharmacy for this prescription? Yes If no, delete pharmacy and type the correct one.   Has the prescription been filled recently? Yes  Is the patient out of the medication? Yes  Has the patient been seen for an appointment in the last year OR does the patient have an upcoming appointment? Yes  Can we respond through MyChart? No, [id number]  Agent: Please be advised that Rx refills may take up to 3 business days. We ask that you follow-up with your pharmacy. >> Jul 07, 2024  4:02 PM Zebedee SAUNDERS wrote: Pt was with Dr. Thomas Thekkekandan and has a scheduled app with Benton Gave PA on 08/22/2024. She needs a refill on her medication.

## 2024-07-10 NOTE — Telephone Encounter (Signed)
 Sent in refill to get patient through until she comes in for her December appt. Left VM for patient that Rx was sent in as requested.

## 2024-07-25 ENCOUNTER — Ambulatory Visit: Payer: Medicare (Managed Care)

## 2024-08-08 ENCOUNTER — Telehealth: Payer: Self-pay

## 2024-08-08 NOTE — Progress Notes (Signed)
   08/08/2024  Patient ID: Kristy Hays, female   DOB: 1946/07/31, 78 y.o.   MRN: [id number]  This patient is appearing on a report for being at risk of failing the adherence measure for hypertension (ACEi/ARB) medications this calendar year.   Medication: lisinopril /hydrochlorothiazide  10/12.5mg  Last fill date: 07/10/24 for 90 day supply  Insurance report was not up to date. No action needed at this time.   Channing DELENA Mealing, PharmD, DPLA

## 2024-08-17 ENCOUNTER — Telehealth: Payer: Self-pay

## 2024-08-17 DIAGNOSIS — I10 Essential (primary) hypertension: Secondary | ICD-10-CM

## 2024-08-17 DIAGNOSIS — Z0000 Encounter for general adult medical examination without abnormal findings: Secondary | ICD-10-CM

## 2024-08-17 DIAGNOSIS — E782 Mixed hyperlipidemia: Secondary | ICD-10-CM

## 2024-08-17 DIAGNOSIS — E119 Type 2 diabetes mellitus without complications: Secondary | ICD-10-CM

## 2024-08-17 NOTE — Telephone Encounter (Signed)
 The patient is scheduled to transfer care to provider.   Annual labs have been ordered. Pended for the provider's review.

## 2024-08-17 NOTE — Telephone Encounter (Signed)
 Copied from CRM [id number]. Topic: Clinical - Request for Lab/Test Order >> Aug 16, 2024  3:03 PM Tobias CROME wrote: Reason for CRM: Patient requesting lab orders be placed for upcoming physical on 08/22/24. Patient would like to have labs completed before physical so that she may discuss results with Whitney.   Requesting call one orders have been placed: [id number]

## 2024-08-17 NOTE — Telephone Encounter (Signed)
 Okay thank you. Labs prior are fine!

## 2024-08-18 NOTE — Telephone Encounter (Signed)
 Attempted to contact the patient regarding the lab order request. No answer. Left a detailed vm message that labs have been ordered and the lab schedule was provided. Direct call back information provided.

## 2024-08-19 ENCOUNTER — Ambulatory Visit: Payer: Self-pay | Admitting: Urgent Care

## 2024-08-22 ENCOUNTER — Encounter: Payer: Self-pay | Admitting: Urgent Care

## 2024-08-22 ENCOUNTER — Ambulatory Visit: Payer: Medicare (Managed Care) | Admitting: Urgent Care

## 2024-08-22 VITALS — BP 118/68 | HR 66 | Ht 64.0 in | Wt 172.0 lb

## 2024-08-22 DIAGNOSIS — R7303 Prediabetes: Secondary | ICD-10-CM

## 2024-08-22 DIAGNOSIS — Z0000 Encounter for general adult medical examination without abnormal findings: Secondary | ICD-10-CM

## 2024-08-22 DIAGNOSIS — E782 Mixed hyperlipidemia: Secondary | ICD-10-CM

## 2024-08-22 DIAGNOSIS — I10 Essential (primary) hypertension: Secondary | ICD-10-CM

## 2024-08-22 DIAGNOSIS — F332 Major depressive disorder, recurrent severe without psychotic features: Secondary | ICD-10-CM

## 2024-08-22 LAB — POCT URINALYSIS DIP (CLINITEK)
Bilirubin, UA: NEGATIVE
Blood, UA: NEGATIVE
Glucose, UA: NEGATIVE mg/dL
Ketones, POC UA: NEGATIVE mg/dL
Leukocytes, UA: NEGATIVE
Nitrite, UA: NEGATIVE
POC PROTEIN,UA: NEGATIVE
Spec Grav, UA: 1.01 (ref 1.010–1.025)
Urobilinogen, UA: 0.2 U/dL
pH, UA: 6 (ref 5.0–8.0)

## 2024-08-22 MED ORDER — ROSUVASTATIN CALCIUM 10 MG PO TABS: 10.0000 mg | ORAL_TABLET | Freq: Every day | ORAL | 3 refills | Status: DC

## 2024-08-22 MED ORDER — PAROXETINE HCL 20 MG PO TABS: 20.0000 mg | ORAL_TABLET | Freq: Every day | ORAL | 3 refills | Status: DC

## 2024-08-22 MED ORDER — LISINOPRIL-HYDROCHLOROTHIAZIDE 10-12.5 MG PO TABS: 1.0000 | ORAL_TABLET | Freq: Every day | ORAL | 3 refills | Status: DC

## 2024-08-22 MED ORDER — AMLODIPINE BESYLATE 10 MG PO TABS: 10.0000 mg | ORAL_TABLET | Freq: Every day | ORAL | 3 refills | Status: DC

## 2024-08-22 MED ORDER — CARVEDILOL 25 MG PO TABS: 25.0000 mg | ORAL_TABLET | Freq: Two times a day (BID) | ORAL | 3 refills | Status: DC

## 2024-08-22 MED ORDER — ROSUVASTATIN CALCIUM 5 MG PO TABS: 5.0000 mg | ORAL_TABLET | Freq: Every day | ORAL | 3 refills | Status: DC

## 2024-08-22 NOTE — Patient Instructions (Addendum)
 We completed your annual physical today. Please STOP the 10mg  crestor , will switch to 5mg .  Start Magnesium  Glycinate 200mg  at night time.  Follow up with me in 6 months, sooner as needed

## 2024-08-22 NOTE — Progress Notes (Signed)
 Annual Wellness Visit     Patient: Kristy Hays, Female    DOB: November 20, 1945, 78 y.o.   MRN: [id number]  Subjective  Chief Complaint  Patient presents with   Annual Exam    Kristy Hays is a 78 y.o. female who presents today for her Annual Wellness Visit. She reports consuming a general diet. Pt walks daily at least one mile with her dog. Also attends the gym. She generally feels well. She reports sleeping well. She does not have additional problems to discuss today.   HPI  Discussed the use of AI scribe software for clinical note transcription with the patient, who gave verbal consent to proceed.  History of Present Illness   Kristy Hays is a 78 year old female who presents for an annual physical exam.  She feels well for her age with no major health concerns. She maintains a regular sleep pattern, averaging eight to nine hours per night without disturbances. Her diet is balanced, including vegetables and dairy, and her weight has remained stable. She engages in regular physical activity, walking her dog daily for a mile, and aims to return to the gym to use the bicycle more frequently. She has been living in [place] for 17 years after retiring from work in [place] to be closer to her daughter.  She has a history of glaucoma and underwent cataract surgery four years ago, which addressed her glaucoma, eliminating the need for eye drops. She continues to see her eye doctor biannually and has an appointment scheduled for tomorrow.  Her current medications include amlodipine , carvedilol  25 mg twice daily, lisinopril  hydrochlorothiazide , Claritin , omega-3 fish oil, paroxetine , and rosuvastatin . She reports experiencing leg cramps and notes that rosuvastatin  was started last December after switching from Lipitor and Repatha . The dosage was initially 5 mg and then increased to 10 mg.  She has a history of being in the prediabetic range but has never been diagnosed with diabetes. Her recent  A1c was 5.9%, an improvement from 6.1% eight months ago. She has never had an A1c consistent with diabetes.      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   Closed fracture dislocation of shoulder, right, initial encounter 10/27/2021   Osteopenia 08/03/2018   Closed intertrochanteric fracture of right hip status post nailing 07/26/2018   Premature atrial contraction 06/29/2018   Annual physical exam 08/19/2015   Primary osteoarthritis of right knee 08/19/2015   Renal insufficiency 08/15/2014   Panic disorder 05/23/2014   Major depressive disorder, recurrent severe without psychotic features (HCC) 08/06/2013   Hyperlipidemia 07/07/2012   Hypertension 07/05/2012   Past Medical History:  Diagnosis Date   Allergy    Anxiety    Arthritis    Depression    Glaucoma    Hyperlipidemia    Hypertension    Proximal humerus fracture    right   Past Surgical History:  Procedure Laterality Date   ABDOMINAL HYSTERECTOMY     EYE SURGERY     HIP FRACTURE SURGERY Right    JOINT REPLACEMENT     REVERSE SHOULDER ARTHROPLASTY Right 12/11/2021   Procedure: REVERSE SHOULDER ARTHROPLASTY;  Surgeon: Cristy Bonner DASEN, MD;  Location: [facility];  Service: Orthopedics;  Laterality: Right;   Social History   Tobacco Use   Smoking status:  Former    Current packs/day: 0.00    Average packs/day: 0.3 packs/day for 15.0 years (3.8 ttl pk-yrs)    Types: Cigarettes    Quit date: 12/13/1988    Years since quitting: 35.7   Smokeless tobacco: Never   Tobacco comments:    Quit 24 years ago,  Substance Use Topics   Alcohol use: Never    Comment: Very seldom-5 times a year   Drug use: Never      Medications: Outpatient Medications Prior to Visit  Medication Sig   Biotin  89999 MCG TABS Take by mouth.   Calcium  Carbonate-Vitamin D  600-400 MG-UNIT tablet Take 1 tablet by mouth 2 (two) times daily.   Coenzyme Q10 (CO Q-10) 200 MG CAPS Take 200 mg by mouth daily.   loratadine  (CLARITIN ) 10 MG tablet Take 1 tablet (10 mg total) by mouth daily.   Omega-3 Fatty Acids  (FISH OIL) 1200 MG CAPS Take by mouth.   [DISCONTINUED] amLODipine  (NORVASC ) 10 MG tablet Take 1 tablet (10 mg total) by mouth daily. NEEDS APPOINTMENT FOR FURTHER REFILLS.   [DISCONTINUED] carvedilol  (COREG ) 25 MG tablet Take 1 tablet (25 mg total) by mouth 2 (two) times daily with a meal.   [DISCONTINUED] lisinopril -hydrochlorothiazide  (ZESTORETIC ) 10-12.5 MG tablet Take 1 tablet by mouth daily.   [DISCONTINUED] PARoxetine  (PAXIL ) 20 MG tablet Take 1 tablet (20 mg total) by mouth daily.   [DISCONTINUED] rosuvastatin  (CRESTOR ) 10 MG tablet Take 1 tablet (10 mg total) by mouth daily.   No facility-administered medications prior to visit.    Allergies  Allergen Reactions   Ezetimibe  Other (See Comments)    Flushing?   Antihistamines, Chlorpheniramine-Type Other (See Comments)    Makes pt Hyper   Benadryl [Diphenhydramine Hcl (Sleep)]     Makes her hyper   Penicillins     Patient Care Team: Amorette Charrette L, [place] as PCP - General (Physician Assistant)  ROS Complete 12 point ROS performed with all pertinent positives listed in HPI     Objective  BP 118/68   Pulse 66   Ht 5' 4 (1.626 m)   Wt 172 lb (78 kg)   SpO2 94%   BMI 29.52 kg/m  BP Readings from Last 3 Encounters:  08/22/24 118/68  08/19/23 126/69  09/03/22 (!) 152/73   Wt Readings from Last 3 Encounters:  08/22/24 172 lb (78 kg)  08/19/23 172 lb (78 kg)  09/03/22 177 lb (80.3 kg)      Physical Exam Vitals and nursing note reviewed.  Constitutional:      General: She is not in acute distress.    Appearance: Normal appearance. She is not ill-appearing, toxic-appearing or diaphoretic.  HENT:     Head: Normocephalic and  atraumatic.     Right Ear: Tympanic membrane, ear canal and external ear normal. There is no impacted cerumen.     Left Ear: Tympanic membrane, ear canal and external ear normal. There is no impacted cerumen.     Nose: Nose normal.     Mouth/Throat:     Mouth: Mucous membranes are moist.     Pharynx: Oropharynx is clear. No oropharyngeal exudate or posterior oropharyngeal erythema.  Eyes:     General: No scleral icterus.       Right eye: No discharge.        Left eye: No discharge.     Extraocular Movements: Extraocular movements intact.     Pupils: Pupils are equal, round, and reactive to light.  Neck:  Thyroid : No thyroid  mass, thyromegaly or thyroid  tenderness.  Cardiovascular:     Rate and Rhythm: Normal rate and regular rhythm.     Pulses: Normal pulses.     Heart sounds: No murmur heard. Pulmonary:     Effort: Pulmonary effort is normal. No respiratory distress.     Breath sounds: Normal breath sounds. No stridor. No wheezing or rhonchi.  Abdominal:     General: Abdomen is flat. Bowel sounds are normal. There is no distension.     Palpations: Abdomen is soft. There is no mass.     Tenderness: There is no abdominal tenderness. There is no guarding.  Musculoskeletal:     Cervical back: Normal range of motion and neck supple. No rigidity or tenderness.     Right lower leg: No edema.     Left lower leg: No edema.  Lymphadenopathy:     Cervical: No cervical adenopathy.  Skin:    General: Skin is warm and dry.     Coloration: Skin is not jaundiced.     Findings: No bruising, erythema or rash.  Neurological:     General: No focal deficit present.     Mental Status: She is alert and oriented to person, place, and time.     Sensory: No sensory deficit.     Motor: No weakness.  Psychiatric:        Mood and Affect: Mood normal.        Behavior: Behavior normal.     Most recent depression screenings:    08/22/2024   11:42 PM 08/19/2023    2:31 PM  PHQ 2/9 Scores  PHQ -  2 Score 0 0  PHQ- 9 Score 0 0      Data saved with a previous flowsheet row definition    No data recorded  Vision/Hearing Screen: No results found.  Last CBC Lab Results  Component Value Date   WBC 4.7 08/18/2024   HGB 13.2 08/18/2024   HCT 40.4 08/18/2024   MCV 91 08/18/2024   MCH 29.9 08/18/2024   RDW 12.5 08/18/2024   PLT 254 08/18/2024   Last metabolic panel Lab Results  Component Value Date   GLUCOSE 102 (H) 08/18/2024   NA 144 08/18/2024   K 4.0 08/18/2024   CL 105 08/18/2024   CO2 24 08/18/2024   BUN 16 08/18/2024   CREATININE 0.83 08/18/2024   EGFR 72 08/18/2024   CALCIUM  9.3 08/18/2024   PROT 6.6 08/18/2024   ALBUMIN 4.4 08/18/2024   LABGLOB 2.2 08/18/2024   BILITOT 0.4 08/18/2024   ALKPHOS 83 08/18/2024   AST 20 08/18/2024   ALT 14 08/18/2024   ANIONGAP 9 12/09/2021   Last lipids Lab Results  Component Value Date   CHOL 174 08/18/2024   HDL 71 08/18/2024   LDLCALC 88 08/18/2024   TRIG 83 08/18/2024   CHOLHDL 2.5 08/18/2024   Last hemoglobin A1c Lab Results  Component Value Date   HGBA1C 5.9 (H) 08/18/2024   Last thyroid  functions Lab Results  Component Value Date   TSH 1.110 08/18/2024   Last vitamin D  Lab Results  Component Value Date   VD25OH 40 01/24/2019   Last vitamin B12 and Folate No results found for: VITAMINB12, FOLATE    Results for orders placed or performed in visit on 08/22/24  POCT URINALYSIS DIP (CLINITEK)  Result Value Ref Range   Color, UA yellow yellow   Clarity, UA clear clear   Glucose, UA negative negative mg/dL  Bilirubin, UA negative negative   Ketones, POC UA negative negative mg/dL   Spec Grav, UA 8.989 8.989 - 1.025   Blood, UA negative negative   pH, UA 6.0 5.0 - 8.0   POC PROTEIN,UA negative negative, trace   Urobilinogen, UA 0.2 0.2 or 1.0 E.U./dL   Nitrite, UA Negative Negative   Leukocytes, UA Negative Negative      Assessment & Plan   Annual wellness visit done today including  the all of the following: Reviewed patient's Family and Medical History Reviewed and updated list of patient's medical providers Assessment of cognitive impairment was done Assessed patient's functional ability Established a written schedule for health screening services Health Risk Assessent Completed and Reviewed  Exercise Activities and Dietary recommendations  Goals      Ride on the bicycle more at the gym.        Immunization History  Administered Date(s) Administered   Fluad Quad(high Dose 65+) 05/26/2021   INFLUENZA, HIGH DOSE SEASONAL PF 05/16/2019   Influenza Split 05/15/2012   Influenza-Unspecified 05/26/2020, 06/16/2022, 06/17/2023, 06/21/2024   Moderna Sars-Covid-2 Vaccination 06/16/2022   PFIZER(Purple Top)SARS-COV-2 Vaccination 10/04/2019, 10/25/2019, 06/07/2020, 05/26/2021   Pfizer(Comirnaty)Fall Seasonal Vaccine 12 years and older 06/17/2023   Pneumococcal Conjugate-13 08/19/2015   Pneumococcal Polysaccharide-23 08/09/2012   Tdap 09/14/1998, 08/09/2012, 09/03/2022   Zoster Recombinant(Shingrix ) 12/28/2019, 08/14/2020   Zoster, Live 08/09/2012    Health Maintenance  Topic Date Due   Medicare Annual Wellness (AWV)  09/04/2023   COVID-19 Vaccine (7 - 2025-26 season) 09/07/2024 (Originally 05/15/2024)   HEMOGLOBIN A1C  02/16/2025   OPHTHALMOLOGY EXAM  02/23/2025   Diabetic kidney evaluation - eGFR measurement  08/18/2025   Diabetic kidney evaluation - Urine ACR  08/18/2025   FOOT EXAM  08/22/2025   DTaP/Tdap/Td (4 - Td or Tdap) 09/03/2032   Pneumococcal Vaccine: 50+ Years  Completed   Influenza Vaccine  Completed   Bone Density Scan  Completed   Hepatitis C Screening  Completed   Zoster Vaccines- Shingrix   Completed   Meningococcal B Vaccine  Aged Out   Mammogram  Discontinued   Colonoscopy  Discontinued     Discussed health benefits of physical activity, and encouraged her to engage in regular exercise appropriate for her age and condition.    Problem  List Items Addressed This Visit     Major depressive disorder, recurrent severe without psychotic features (HCC)   Relevant Medications   PARoxetine  (PAXIL ) 20 MG tablet   Hypertension   Relevant Medications   lisinopril -hydrochlorothiazide  (ZESTORETIC ) 10-12.5 MG tablet   amLODipine  (NORVASC ) 10 MG tablet   carvedilol  (COREG ) 25 MG tablet   rosuvastatin  (CRESTOR ) 5 MG tablet   Other Relevant Orders   POCT URINALYSIS DIP (CLINITEK) (Completed)   Hyperlipidemia   Relevant Medications   lisinopril -hydrochlorothiazide  (ZESTORETIC ) 10-12.5 MG tablet   amLODipine  (NORVASC ) 10 MG tablet   carvedilol  (COREG ) 25 MG tablet   rosuvastatin  (CRESTOR ) 5 MG tablet   Other Visit Diagnoses       Routine adult health maintenance    -  Primary     Prediabetes       Relevant Orders   Microalbumin / creatinine urine ratio      Assessment and Plan    Primary hypertension Blood pressure is well-controlled with current medication regimen. - Continue amlodipine , carvedilol , and lisinopril  hydrochlorothiazide .  Pure hypercholesterolemia Cholesterol levels are well-controlled with rosuvastatin . Reports leg cramps potentially related to rosuvastatin . - Reduce rosuvastatin  to 5 mg if leg cramps persist. -  Consider magnesium  glycinate for muscle cramps.  Prediabetes A1c is 5.9, indicating prediabetes. - Continue monitoring A1c levels.  Major depressive disorder, recurrent Currently well-managed on paroxetine . - Continue paroxetine  with a 90-day supply and three refills.  History of glaucoma Glaucoma is well-managed post-cataract surgery. - Continue biannual eye exams.  General Health Maintenance Received flu shot in October. No COVID-19 vaccines received. Regular dental and eye exams maintained. - Continue annual flu vaccination. - Maintain regular dental and eye exams.       Return in about 6 months (around 02/20/2025).     Benton LITTIE Gave, PA

## 2024-08-23 LAB — MICROALBUMIN / CREATININE URINE RATIO
Creatinine, Urine: 36.4 mg/dL
Microalb/Creat Ratio: <8 mg/g{creat} (ref 0–29)
Microalbumin, Urine: <3 ug/mL

## 2024-08-24 ENCOUNTER — Ambulatory Visit: Payer: Self-pay | Admitting: Urgent Care

## 2024-08-26 LAB — MICROALBUMIN / CREATININE URINE RATIO

## 2024-08-26 LAB — CBC WITH DIFFERENTIAL/PLATELET
Basophils Absolute: 0 x10E3/uL (ref 0.0–0.2)
Basos: 1 %
EOS (ABSOLUTE): 0.1 x10E3/uL (ref 0.0–0.4)
Eos: 3 %
Hematocrit: 40.4 % (ref 34.0–46.6)
Hemoglobin: 13.2 g/dL (ref 11.1–15.9)
Immature Grans (Abs): 0 x10E3/uL (ref 0.0–0.1)
Immature Granulocytes: 0 %
Lymphocytes Absolute: 1.1 x10E3/uL (ref 0.7–3.1)
Lymphs: 22 %
MCH: 29.9 pg (ref 26.6–33.0)
MCHC: 32.7 g/dL (ref 31.5–35.7)
MCV: 91 fL (ref 79–97)
Monocytes Absolute: 0.5 x10E3/uL (ref 0.1–0.9)
Monocytes: 10 %
Neutrophils Absolute: 3.1 x10E3/uL (ref 1.4–7.0)
Neutrophils: 64 %
Platelets: 254 x10E3/uL (ref 150–450)
RBC: 4.42 x10E6/uL (ref 3.77–5.28)
RDW: 12.5 % (ref 11.7–15.4)
WBC: 4.7 x10E3/uL (ref 3.4–10.8)

## 2024-08-26 LAB — HEMOGLOBIN A1C
Est. average glucose Bld gHb Est-mCnc: 123 mg/dL
Hgb A1c MFr Bld: 5.9 % — ABNORMAL HIGH (ref 4.8–5.6)

## 2024-08-26 LAB — COMPREHENSIVE METABOLIC PANEL WITH GFR
ALT: 14 IU/L (ref 0–32)
AST: 20 IU/L (ref 0–40)
Albumin: 4.4 g/dL (ref 3.8–4.8)
Alkaline Phosphatase: 83 IU/L (ref 49–135)
BUN/Creatinine Ratio: 19 (ref 12–28)
BUN: 16 mg/dL (ref 8–27)
Bilirubin Total: 0.4 mg/dL (ref 0.0–1.2)
CO2: 24 mmol/L (ref 20–29)
Calcium: 9.3 mg/dL (ref 8.7–10.3)
Chloride: 105 mmol/L (ref 96–106)
Creatinine, Ser: 0.83 mg/dL (ref 0.57–1.00)
Globulin, Total: 2.2 g/dL (ref 1.5–4.5)
Glucose: 102 mg/dL — ABNORMAL HIGH (ref 70–99)
Potassium: 4 mmol/L (ref 3.5–5.2)
Sodium: 144 mmol/L (ref 134–144)
Total Protein: 6.6 g/dL (ref 6.0–8.5)
eGFR: 72 mL/min/1.73 (ref >59)

## 2024-08-26 LAB — TSH: TSH: 1.11 u[IU]/mL (ref 0.450–4.500)

## 2024-08-26 LAB — LIPID PANEL
Chol/HDL Ratio: 2.5 ratio (ref 0.0–4.4)
Cholesterol, Total: 174 mg/dL (ref 100–199)
HDL: 71 mg/dL (ref >39)
LDL Chol Calc (NIH): 88 mg/dL (ref 0–99)
Triglycerides: 83 mg/dL (ref 0–149)
VLDL Cholesterol Cal: 15 mg/dL (ref 5–40)

## 2024-09-21 ENCOUNTER — Telehealth: Payer: Self-pay

## 2024-09-21 NOTE — Telephone Encounter (Signed)
 Left message for patient to return call to office to verify that all prescriptions need to be sent to mail order pharmacy as they were just sent to local pharmacy last month. Also, which mail order pharmacy should they be sent to.

## 2024-09-21 NOTE — Telephone Encounter (Signed)
 Patient stated that she would [facility]

## 2024-09-22 ENCOUNTER — Other Ambulatory Visit: Payer: Self-pay

## 2024-09-22 DIAGNOSIS — I10 Essential (primary) hypertension: Secondary | ICD-10-CM

## 2024-09-22 DIAGNOSIS — F332 Major depressive disorder, recurrent severe without psychotic features: Secondary | ICD-10-CM

## 2024-09-22 DIAGNOSIS — E782 Mixed hyperlipidemia: Secondary | ICD-10-CM

## 2024-09-22 MED ORDER — PAROXETINE HCL 20 MG PO TABS: 20.0000 mg | ORAL_TABLET | Freq: Every day | ORAL | 3 refills | Status: DC

## 2024-09-22 MED ORDER — CARVEDILOL 25 MG PO TABS: 25.0000 mg | ORAL_TABLET | Freq: Two times a day (BID) | ORAL | 3 refills | Status: DC

## 2024-09-22 MED ORDER — LISINOPRIL-HYDROCHLOROTHIAZIDE 10-12.5 MG PO TABS: 1.0000 | ORAL_TABLET | Freq: Every day | ORAL | 3 refills | Status: DC

## 2024-09-22 MED ORDER — ROSUVASTATIN CALCIUM 5 MG PO TABS: 5.0000 mg | ORAL_TABLET | Freq: Every day | ORAL | 3 refills | Status: DC

## 2024-09-22 NOTE — Telephone Encounter (Signed)
 All prescriptions requested have been sent to [facility].

## 2024-09-24 ENCOUNTER — Encounter: Payer: Self-pay | Admitting: Urgent Care

## 2024-09-24 DIAGNOSIS — I10 Essential (primary) hypertension: Secondary | ICD-10-CM

## 2024-09-24 DIAGNOSIS — F332 Major depressive disorder, recurrent severe without psychotic features: Secondary | ICD-10-CM

## 2024-09-24 DIAGNOSIS — E782 Mixed hyperlipidemia: Secondary | ICD-10-CM

## 2024-09-25 MED ORDER — CARVEDILOL 25 MG PO TABS: 25.0000 mg | ORAL_TABLET | Freq: Two times a day (BID) | ORAL | 3 refills | Status: AC

## 2024-09-25 MED ORDER — ROSUVASTATIN CALCIUM 5 MG PO TABS: 5.0000 mg | ORAL_TABLET | Freq: Every day | ORAL | 3 refills | Status: AC

## 2024-09-25 MED ORDER — LISINOPRIL-HYDROCHLOROTHIAZIDE 10-12.5 MG PO TABS: 1.0000 | ORAL_TABLET | Freq: Every day | ORAL | 3 refills | Status: AC

## 2024-09-25 MED ORDER — PAROXETINE HCL 20 MG PO TABS: 20.0000 mg | ORAL_TABLET | Freq: Every day | ORAL | 3 refills | Status: AC

## 2024-10-11 ENCOUNTER — Other Ambulatory Visit: Payer: Self-pay

## 2024-10-11 DIAGNOSIS — I10 Essential (primary) hypertension: Secondary | ICD-10-CM

## 2024-10-11 MED ORDER — AMLODIPINE BESYLATE 10 MG PO TABS: 10.0000 mg | ORAL_TABLET | Freq: Every day | ORAL | 3 refills | Status: AC

## 2024-10-13 NOTE — Telephone Encounter (Signed)
 Thank you for taking care of this.

## 2024-10-13 NOTE — Telephone Encounter (Signed)
 [facility] and spoke with [place] who states patient filled these medications at [facility] pharmacy dec 9th and isn't due to be refilled until 10/28/24. The amlodipine  is saying it needs information from the provider was transferred to the pharmacy for clarification on what is exactly needed. They they received the first script 1/27 with no directions. Sent again 1/28 in which she stated they hadn't received it yet. Verbal order given. Called to inform the patient.

## 2025-02-20 ENCOUNTER — Ambulatory Visit: Payer: Medicare (Managed Care) | Admitting: Urgent Care
